# Patient Record
Sex: Female | Born: 1955 | Race: White | Hispanic: No | Marital: Married | Smoking: Former smoker
Health system: Southern US, Community
[De-identification: ages and names within clinical notes are randomized; demographics above are authoritative.]

## PROBLEM LIST (undated history)

## (undated) DIAGNOSIS — I499 Cardiac arrhythmia, unspecified: Secondary | ICD-10-CM

## (undated) DIAGNOSIS — I4891 Unspecified atrial fibrillation: Secondary | ICD-10-CM

## (undated) DIAGNOSIS — I10 Essential (primary) hypertension: Secondary | ICD-10-CM

## (undated) HISTORY — PX: CHOLECYSTECTOMY: SHX55

## (undated) HISTORY — DX: Unspecified atrial fibrillation: I48.91

## (undated) HISTORY — PX: WRIST SURGERY: SHX841

---
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
## 1998-03-06 HISTORY — PX: ABDOMINAL HYSTERECTOMY: SHX81

## 1998-03-06 HISTORY — PX: TOTAL ABDOMINAL HYSTERECTOMY: SHX209

## 2003-03-07 HISTORY — PX: CHOLECYSTECTOMY: SHX55

## 2007-03-07 HISTORY — PX: ROUX-EN-Y GASTRIC BYPASS: SHX1104

## 2015-03-07 HISTORY — PX: WRIST SURGERY: SHX841

## 2015-09-19 ENCOUNTER — Emergency Department
Admission: EM | Admit: 2015-09-19 | Discharge: 2015-09-19 | Disposition: A | Discharge status: Home / Self Care | Payer: Self-pay | Source: Home / Self Care | Attending: Family Medicine | Admitting: Family Medicine

## 2015-09-19 ENCOUNTER — Encounter: Payer: Self-pay | Admitting: Emergency Medicine

## 2015-09-19 DIAGNOSIS — N3001 Acute cystitis with hematuria: Secondary | ICD-10-CM

## 2015-09-19 HISTORY — DX: Essential (primary) hypertension: I10

## 2015-09-19 LAB — POCT URINALYSIS DIP (MANUAL ENTRY)
Bilirubin, UA: NEGATIVE
Glucose, UA: NEGATIVE
Ketones, POC UA: NEGATIVE
Nitrite, UA: NEGATIVE
Protein Ur, POC: 100 — AB
Spec Grav, UA: 1.015 (ref 1.005–1.03)
Urobilinogen, UA: 0.2 (ref 0–1)
pH, UA: 6 (ref 5–8)

## 2015-09-19 MED ORDER — AMOXICILLIN-POT CLAVULANATE 500-125 MG PO TABS: 1.0000 | ORAL_TABLET | Freq: Two times a day (BID) | ORAL | Status: DC

## 2015-09-19 NOTE — ED Provider Notes (Signed)
CSN: [id number]     Arrival date & time 09/19/15  1725 History   First MD Initiated Contact with Patient 09/19/15 1730     Chief Complaint  Patient presents with  . Dysuria   (Consider location/radiation/quality/duration/timing/severity/associated sxs/prior Treatment) HPI  Judith Wood is a 60 y.o. female presenting to UC with c/o 2 days of urinary symptoms including burning with urination and blood in urine that started today. Hx of UTI, last one about 1 year ago, she was on amoxicillin and did well. Burning is mild to moderate in severity. Denies fever, chills, n/v/d. Denies abdominal pain or back pain. She has not taken any OTC medications for symptoms.    Past Medical History  Diagnosis Date  . Hypertension    Past Surgical History  Procedure Laterality Date  . Abdominal hysterectomy    . Cholecystectomy    . Wrist surgery     History reviewed. No pertinent family history. Social History  Substance Use Topics  . Smoking status: Never Smoker   . Smokeless tobacco: None  . Alcohol Use: No   OB History    No data available     Review of Systems  Constitutional: Negative for fever and chills.  Gastrointestinal: Negative for nausea, vomiting, abdominal pain and diarrhea.  Genitourinary: Positive for dysuria and hematuria. Negative for urgency, frequency, flank pain and pelvic pain.  Musculoskeletal: Negative for myalgias and back pain.    Allergies  Norco and Tetracyclines & related  Home Medications   Prior to Admission medications   Medication Sig Start Date End Date Taking? Authorizing Provider  aspirin 81 MG chewable tablet Chew by mouth daily.   Yes Historical Provider, MD  diltiazem (DILACOR XR) 180 MG 24 hr capsule Take 180 mg by mouth daily.   Yes Historical Provider, MD  amoxicillin-clavulanate (AUGMENTIN) 500-125 MG tablet Take 1 tablet (500 mg total) by mouth 2 (two) times daily. For 7 days 09/19/15   Noland Fordyce, PA-C   Meds Ordered and Administered  this Visit  Medications - No data to display  BP 133/83 mmHg  Pulse 77  Temp(Src) 98 F (36.7 C) (Oral)  Resp 16  Ht 5\' 5"  (1.651 m)  Wt 244 lb 12 oz (111.018 kg)  BMI 40.73 kg/m2  SpO2 98% No data found.   Physical Exam  Constitutional: She is oriented to person, place, and time. She appears well-developed and well-nourished.  HENT:  Head: Normocephalic and atraumatic.  Mouth/Throat: Oropharynx is clear and moist.  Eyes: EOM are normal.  Neck: Normal range of motion.  Cardiovascular: Normal rate, regular rhythm and normal heart sounds.   Pulmonary/Chest: Effort normal and breath sounds normal. No respiratory distress. She has no wheezes. She has no rales.  Abdominal: Soft. She exhibits no distension. There is no tenderness. There is no CVA tenderness.  Musculoskeletal: Normal range of motion.  Neurological: She is alert and oriented to person, place, and time.  Skin: Skin is warm and dry.  Psychiatric: She has a normal mood and affect. Her behavior is normal.  Nursing note and vitals reviewed.   ED Course  Procedures (including critical care time)  Labs Review Labs Reviewed  POCT URINALYSIS DIP (MANUAL ENTRY) - Abnormal; Notable for the following:    Color, UA red (*)    Clarity, UA cloudy (*)    Blood, UA large (*)    Protein Ur, POC =100 (*)    Leukocytes, UA moderate (2+) (*)    All other components within normal  limits  URINE CULTURE    Imaging Review No results found.    MDM   1. Acute cystitis with hematuria    Urinary symptoms for 2 days. Hx of UTI. Vitals: WNL  UA c/w UTI, will send culture.  Rx: augmentin (pt has done well with amoxicillin in the past).  Pt may try OTC Azo for discomfort. Encouraged to stay well hydrated. F/u with PCP in 4-5 days if not improving, sooner if worsening. Patient verbalized understanding and agreement with treatment plan.     Noland Fordyce, PA-C 09/19/15 1759

## 2015-09-19 NOTE — ED Notes (Signed)
Patient presents to [facility] with C/O dysuria and blood in the urine since yesterday

## 2015-09-19 NOTE — Discharge Instructions (Signed)
°  You may try over the counter medication called Azo to help with bladder spasms.  This medication can make your urine orange, which is normal.  ° ° °

## 2015-09-21 LAB — URINE CULTURE: Colony Count: 75000

## 2015-09-22 ENCOUNTER — Telehealth: Payer: Self-pay | Admitting: Emergency Medicine

## 2015-09-22 NOTE — Telephone Encounter (Signed)
E coli present, finish all meds, feeling better

## 2015-10-14 ENCOUNTER — Ambulatory Visit (INDEPENDENT_AMBULATORY_CARE_PROVIDER_SITE_OTHER): Payer: Self-pay

## 2015-10-14 ENCOUNTER — Ambulatory Visit
Admission: EM | Admit: 2015-10-14 | Discharge: 2015-10-14 | Disposition: A | Discharge status: Home / Self Care | Payer: Self-pay | Attending: Family Medicine | Admitting: Family Medicine

## 2015-10-14 ENCOUNTER — Encounter: Payer: Self-pay | Admitting: *Deleted

## 2015-10-14 DIAGNOSIS — M62830 Muscle spasm of back: Secondary | ICD-10-CM

## 2015-10-14 DIAGNOSIS — M545 Low back pain, unspecified: Secondary | ICD-10-CM

## 2015-10-14 MED ORDER — MELOXICAM 15 MG PO TABS: 15.0000 mg | ORAL_TABLET | Freq: Every day | ORAL | 0 refills | Status: DC

## 2015-10-14 MED ORDER — OXYCODONE-ACETAMINOPHEN 5-325 MG PO TABS: ORAL_TABLET | ORAL | 0 refills | Status: DC

## 2015-10-14 MED ORDER — ORPHENADRINE CITRATE ER 100 MG PO TB12: 100.0000 mg | ORAL_TABLET | Freq: Two times a day (BID) | ORAL | 0 refills | Status: DC

## 2015-10-14 MED ORDER — KETOROLAC TROMETHAMINE 60 MG/2ML IM SOLN
60.0000 mg | Freq: Once | INTRAMUSCULAR | Status: AC
  Administered 2015-10-14: 60 mg via INTRAMUSCULAR

## 2015-10-14 NOTE — ED Triage Notes (Signed)
Patient started having thoracic back pain 2 months ago that has steadily become worse.

## 2015-10-14 NOTE — ED Provider Notes (Signed)
[facility]    CSN: [id number] Arrival date & time: 10/14/15  1637  First Provider Contact:  First MD Initiated Contact with Patient 10/14/15 1743        History   Chief Complaint Chief Complaint  Patient presents with  . Back Pain    HPI Judith Wood is a 60 y.o. female.   Patient is sent from Dr. Oswaldo Milian due to recurrent back pain. Patient she states she's had back pain on the right since June. She reports moving from [place] having a less than ideal bed to sleep and then doing Activity at the placed they're moving into i.e. working on wall covering painting etc. she finally saw Dr. Oswaldo Milian can see him twice in the last week. Because of mild pain and discomfort that she's had similar here to be seen and evaluated. She is wearing a back brace but has not helped much. She did have some Percocet from a old wrist injury and she did take that last night was finally able to sleep. Past medical history she has a history of hypertension and A. fib. She states that she's on the exam for the A. fib and hypertension if she drinks alcohol A. fib gets worse occasionally she has a breakthrough she'll take a flight cannot tell that she has. She takes baby aspirin has not established herself with a PCP since moving here from [place]. Previous surgeries abdominal hysterectomy wrist surgery and cholecystectomy. She is allergic to hydrocodone but can take Percocet. She has never smoked and has no pertinent family medical history relating to this visit.   No language interpreter was used.  Back Pain  Location:  Lumbar spine Quality:  Aching, shooting and stabbing Radiates to:  Does not radiate Pain severity:  Severe Pain is:  Same all the time Timing:  Constant Progression:  Worsening Chronicity:  New Context: physical stress and twisting   Context: not falling, not jumping from heights, not MCA, not MVA, not occupational injury and not recent injury   Relieved by:  Narcotics and  cold packs Worsened by:  Ambulation, movement and twisting Ineffective treatments:  None tried Associated symptoms: no abdominal pain, no leg pain and no numbness   Risk factors: no recent surgery     Past Medical History:  Diagnosis Date  . Hypertension     There are no active problems to display for this patient.   Past Surgical History:  Procedure Laterality Date  . ABDOMINAL HYSTERECTOMY    . CHOLECYSTECTOMY    . WRIST SURGERY      OB History    No data available       Home Medications    Prior to Admission medications   Medication Sig Start Date End Date Taking? Authorizing Provider  aspirin 81 MG chewable tablet Chew by mouth daily.   Yes Historical Provider, MD  diltiazem (DILACOR XR) 180 MG 24 hr capsule Take 180 mg by mouth daily.   Yes Historical Provider, MD  amoxicillin-clavulanate (AUGMENTIN) 500-125 MG tablet Take 1 tablet (500 mg total) by mouth 2 (two) times daily. For 7 days 09/19/15   Noland Fordyce, PA-C  meloxicam (MOBIC) 15 MG tablet Take 1 tablet (15 mg total) by mouth daily. 10/14/15   Frederich Cha, MD  orphenadrine (NORFLEX) 100 MG tablet Take 1 tablet (100 mg total) by mouth 2 (two) times daily. 10/14/15   Frederich Cha, MD  oxyCODONE-acetaminophen (PERCOCET/ROXICET) 5-325 MG tablet 1/2-1 tablet at night when necessary for back pain 10/14/15  Frederich Cha, MD    Family History History reviewed. No pertinent family history.  Social History Social History  Substance Use Topics  . Smoking status: Never Smoker  . Smokeless tobacco: Never Used  . Alcohol use No     Allergies   Norco [hydrocodone-acetaminophen] and Tetracyclines & related   Review of Systems Review of Systems  Gastrointestinal: Negative for abdominal pain.  Musculoskeletal: Positive for back pain.  Neurological: Negative for numbness.  All other systems reviewed and are negative.    Physical Exam Triage Vital Signs ED Triage Vitals  Enc Vitals Group     BP 10/14/15 1734  (!) 173/105     Pulse Rate 10/14/15 1734 74     Resp 10/14/15 1734 18     Temp 10/14/15 1734 98.4 F (36.9 C)     Temp Source 10/14/15 1734 Oral     SpO2 10/14/15 1734 99 %     Weight 10/14/15 1734 265 lb (120.2 kg)     Height 10/14/15 1734 5\' 5"  (1.651 m)     Head Circumference --      Peak Flow --      Pain Score 10/14/15 1737 8     Pain Loc --      Pain Edu? --      Excl. in [place]? --    No data found.   Updated Vital Signs BP (!) 173/105 (BP Location: Left Wrist)   Pulse 74   Temp 98.4 F (36.9 C) (Oral)   Resp 18   Ht 5\' 5"  (1.651 m)   Wt 265 lb (120.2 kg)   SpO2 99%   BMI 44.10 kg/m   Visual Acuity Right Eye Distance:   Left Eye Distance:   Bilateral Distance:    Right Eye Near:   Left Eye Near:    Bilateral Near:     Physical Exam  Constitutional: She is oriented to person, place, and time. She appears well-developed and well-nourished. She appears distressed.  HENT:  Head: Normocephalic and atraumatic.  Eyes: Pupils are equal, round, and reactive to light.  Neck: Normal range of motion.  Pulmonary/Chest: Effort normal.  Musculoskeletal: She exhibits tenderness. She exhibits no edema or deformity.  Neurological: She is alert and oriented to person, place, and time.  Skin: Skin is warm. She is not diaphoretic.  Psychiatric: She has a normal mood and affect.  Vitals reviewed.    UC Treatments / Results  Labs (all labs ordered are listed, but only abnormal results are displayed) Labs Reviewed - No data to display  EKG  EKG Interpretation None       Radiology Dg Lumbar Spine Complete  Result Date: 10/14/2015 CLINICAL DATA:  Chronic lower back pain. EXAM: LUMBAR SPINE - COMPLETE 4+ VIEW COMPARISON:  None. FINDINGS: Mild dextroscoliosis of lumbar spine is noted. No fracture or spondylolisthesis is noted. Severe degenerative disc disease is noted at L3-4, L4-5 and L5-S1. Anterior osteophyte formation is noted anteriorly at L2-3. IMPRESSION: Severe  multilevel degenerative disc disease. No acute abnormality seen in the lumbar spine. Electronically Signed   By: Marijo Conception, M.D.   On: 10/14/2015 18:49    Procedures Procedures (including critical care time)  Medications Ordered in UC Medications  ketorolac (TORADOL) injection 60 mg (60 mg Intramuscular Given 10/14/15 1821)     Initial Impression / Assessment and Plan / UC Course  I have reviewed the triage vital signs and the nursing notes.  Pertinent labs & imaging results that were  available during my care of the patient were reviewed by me and considered in my medical decision making (see chart for details).  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 Will recommend she goes back to Dr. Oswaldo Milian for dry needling and manipulation of the back and spine. Ice for pain control. Percocet 1/2-1 tablet night as needed for pain control. 60 Toradol given now today and will place on Mobic 15 mg and Norflex 100 mg twice a day for muscle spasm and back pain control.  Final Clinical Impressions(s) / UC Diagnoses   Final diagnoses:  Muscle spasm of back  Right-sided low back pain without sciatica    New Prescriptions Discharge Medication List as of 10/14/2015  7:08 PM    START taking these medications   Details  meloxicam (MOBIC) 15 MG tablet Take 1 tablet (15 mg total) by mouth daily., Starting Thu 10/14/2015, Normal    orphenadrine (NORFLEX) 100 MG tablet Take 1 tablet (100 mg total) by mouth 2 (two) times daily., Starting Thu 10/14/2015, Normal    oxyCODONE-acetaminophen (PERCOCET/ROXICET) 5-325 MG tablet 1/2-1 tablet at night when necessary for back pain, Normal         Note: This dictation was prepared with Dragon dictation along with smaller phrase technology. Any transcriptional errors that result from this process are unintentional.   Frederich Cha, MD 10/14/15 [phone]

## 2016-01-15 ENCOUNTER — Other Ambulatory Visit: Payer: Self-pay | Admitting: Internal Medicine

## 2016-01-18 ENCOUNTER — Encounter: Payer: Self-pay | Admitting: Internal Medicine

## 2016-01-18 ENCOUNTER — Ambulatory Visit (INDEPENDENT_AMBULATORY_CARE_PROVIDER_SITE_OTHER): Payer: Self-pay | Admitting: Internal Medicine

## 2016-01-18 VITALS — BP 126/82 | HR 67 | Resp 16 | Ht 65.0 in | Wt 264.0 lb

## 2016-01-18 DIAGNOSIS — E559 Vitamin D deficiency, unspecified: Secondary | ICD-10-CM | POA: Insufficient documentation

## 2016-01-18 DIAGNOSIS — E785 Hyperlipidemia, unspecified: Secondary | ICD-10-CM | POA: Insufficient documentation

## 2016-01-18 DIAGNOSIS — I480 Paroxysmal atrial fibrillation: Secondary | ICD-10-CM | POA: Insufficient documentation

## 2016-01-18 DIAGNOSIS — E782 Mixed hyperlipidemia: Secondary | ICD-10-CM

## 2016-01-18 DIAGNOSIS — Z8679 Personal history of other diseases of the circulatory system: Secondary | ICD-10-CM

## 2016-01-18 DIAGNOSIS — R928 Other abnormal and inconclusive findings on diagnostic imaging of breast: Secondary | ICD-10-CM

## 2016-01-18 DIAGNOSIS — Z9884 Bariatric surgery status: Secondary | ICD-10-CM | POA: Insufficient documentation

## 2016-01-18 DIAGNOSIS — I10 Essential (primary) hypertension: Secondary | ICD-10-CM

## 2016-01-18 HISTORY — DX: Other abnormal and inconclusive findings on diagnostic imaging of breast: R92.8

## 2016-01-18 MED ORDER — DILTIAZEM HCL ER 180 MG PO CP24: 180.0000 mg | ORAL_CAPSULE | Freq: Every day | ORAL | 1 refills | Status: DC

## 2016-01-18 NOTE — Progress Notes (Signed)
Date:  01/18/2016   Name:  Judith Wood   DOB:  01-22-1956   MRN:  [id number]   Chief Complaint: Establish Care (Discuss mammograms ) Abnormal mammogram - gets annual mammograms.  In 2016 November had an abnormality in right breast.  Had moved to [facility] so had another mammogram that was normal.  Now due for 6 month follow up that was recommended.  Hypertension  This is a chronic problem. The current episode started more than 1 year ago. The problem is unchanged. The problem is controlled. Pertinent negatives include no chest pain, headaches, palpitations or shortness of breath. (Had onset of A Fib 2 years ago - related to wine and now has not had any further sx.)    Review of Systems  Constitutional: Negative for chills, fatigue, fever and unexpected weight change.  Eyes: Negative for visual disturbance.  Respiratory: Negative for cough, chest tightness, shortness of breath and wheezing.   Cardiovascular: Negative for chest pain, palpitations and leg swelling.  Gastrointestinal: Negative for abdominal pain.  Neurological: Negative for dizziness and headaches.    Patient Active Problem List   Diagnosis Date Noted  . Hypertension 01/18/2016  . Episodic atrial fibrillation ([place]) 01/18/2016    Prior to Admission medications   Medication Sig Start Date End Date Taking? Authorizing Provider  aspirin 81 MG chewable tablet Chew by mouth daily.   Yes Historical Provider, MD  diltiazem (DILACOR XR) 180 MG 24 hr capsule Take 180 mg by mouth daily.   Yes Historical Provider, MD    Allergies  Allergen Reactions  . Norco [Hydrocodone-Acetaminophen]   . Tetracyclines & Related     Past Surgical History:  Procedure Laterality Date  . ABDOMINAL HYSTERECTOMY     has cervix   . CHOLECYSTECTOMY    . WRIST SURGERY      Social History  Substance Use Topics  . Smoking status: Never Smoker  . Smokeless tobacco: Never Used  . Alcohol use No    Medication list has been reviewed and  updated.   Physical Exam  Constitutional: She is oriented to person, place, and time. She appears well-developed and well-nourished. No distress.  HENT:  Head: Normocephalic and atraumatic.  Neck: Normal range of motion. Neck supple. No thyromegaly present.  Cardiovascular: Normal rate, regular rhythm and normal heart sounds.  Exam reveals no gallop.   No murmur heard. Pulmonary/Chest: Effort normal and breath sounds normal. No respiratory distress. She has no wheezes.  Musculoskeletal: Normal range of motion.  Neurological: She is alert and oriented to person, place, and time.  Skin: Skin is warm and dry. No rash noted.  Psychiatric: She has a normal mood and affect. Her behavior is normal. Thought content normal.  Nursing note and vitals reviewed.   BP 126/82   Pulse 67   Resp 16   Ht 5\' 5"  (1.651 m)   Wt 264 lb (119.7 kg)   SpO2 98%   BMI 43.93 kg/m   Assessment and Plan: 1. Essential hypertension controlled - diltiazem (DILACOR XR) 180 MG 24 hr capsule; Take 1 capsule (180 mg total) by mouth daily.  Dispense: 90 capsule; Refill: 1 - Comprehensive metabolic panel - TSH  2. History of atrial fibrillation None since initial episode  3. Abnormal mammogram of right breast - US BREAST LTD UNI RIGHT INC AXILLA; Future - MM Digital Diagnostic Bilat; Future  4. Mixed hyperlipidemia Check labs since she has gained back most of her weight - Lipid panel  5. History  of Roux-en-Y gastric bypass Continue supplements - Vitamin B12 - CBC with Differential/Platelet  6. Vitamin D deficiency Continue supplement - VITAMIN D 25 Hydroxy (Vit-D Deficiency, Fractures)   Halina Maidens, MD [facility]  01/18/2016

## 2016-01-24 ENCOUNTER — Other Ambulatory Visit: Payer: Self-pay | Admitting: *Deleted

## 2016-01-24 ENCOUNTER — Inpatient Hospital Stay
Admission: RE | Admit: 2016-01-24 | Discharge: 2016-01-24 | Disposition: A | Discharge status: Home / Self Care | Payer: Self-pay | Source: Ambulatory Visit | Attending: *Deleted | Admitting: *Deleted

## 2016-01-24 DIAGNOSIS — Z9289 Personal history of other medical treatment: Secondary | ICD-10-CM

## 2016-02-08 ENCOUNTER — Other Ambulatory Visit: Payer: Self-pay | Admitting: Internal Medicine

## 2016-02-08 ENCOUNTER — Ambulatory Visit
Admission: RE | Admit: 2016-02-08 | Discharge: 2016-02-08 | Disposition: A | Discharge status: Home / Self Care | Payer: Self-pay | Source: Ambulatory Visit | Attending: Internal Medicine | Admitting: Internal Medicine

## 2016-02-08 DIAGNOSIS — R928 Other abnormal and inconclusive findings on diagnostic imaging of breast: Secondary | ICD-10-CM

## 2016-02-08 LAB — LIPID PANEL
CHOLESTEROL: 175 (ref 0–200)
HDL: 38 (ref 35–70)
LDL Cholesterol: 100
Triglycerides: 244 — AB (ref 40–160)

## 2016-02-08 LAB — BASIC METABOLIC PANEL
BUN: 23 — AB (ref 4–21)
Creatinine: 0.9 (ref 0.5–1.1)
GLUCOSE: 89

## 2016-02-08 LAB — CBC AND DIFFERENTIAL
HEMATOCRIT: 38 (ref 36–46)
HEMOGLOBIN: 12.6 (ref 12.0–16.0)

## 2016-02-08 LAB — VITAMIN B12: VITAMIN B 12: 517

## 2016-02-08 LAB — VITAMIN D 25 HYDROXY (VIT D DEFICIENCY, FRACTURES): Vit D, 25-Hydroxy: 29

## 2016-02-08 LAB — TSH: TSH: 2.29 (ref 0.41–5.90)

## 2016-02-08 LAB — HEPATIC FUNCTION PANEL
ALT: 30 (ref 7–35)
AST: 29 (ref 13–35)

## 2016-02-15 NOTE — Progress Notes (Signed)
Please let patient know that her labs are normal.  Continue same medications.

## 2016-03-08 ENCOUNTER — Telehealth: Payer: Self-pay | Admitting: Internal Medicine

## 2016-03-08 NOTE — Telephone Encounter (Signed)
Called pt about lab result--normal except Vitamin D slightly low and need to double on vitamin D supplement.

## 2016-03-08 NOTE — Telephone Encounter (Signed)
Please call the patient to inform her that all of her labs are normal except for slightly low vitamin D. She should double the amount of her vitamin D supplement.

## 2016-03-28 ENCOUNTER — Encounter: Payer: Self-pay | Admitting: Internal Medicine

## 2016-03-28 ENCOUNTER — Ambulatory Visit (INDEPENDENT_AMBULATORY_CARE_PROVIDER_SITE_OTHER): Payer: Self-pay | Admitting: Internal Medicine

## 2016-03-28 VITALS — BP 126/82 | HR 82 | Temp 97.6°F | Ht 65.0 in | Wt 258.0 lb

## 2016-03-28 DIAGNOSIS — N3001 Acute cystitis with hematuria: Secondary | ICD-10-CM

## 2016-03-28 LAB — POC URINALYSIS WITH MICROSCOPIC (NON AUTO)MANUAL RESULT
CRYSTALS: 0
EPITHELIAL CELLS, URINE PER MICROSCOPY: 0
MUCUS UA: 0
Nitrite, UA: NEGATIVE
PROTEIN UA: 5
RBC: 2 M/uL — AB (ref 4.04–5.48)
Spec Grav, UA: <=1.005
Urobilinogen, UA: 0.2
WBC Casts, UA: 15
pH, UA: 5

## 2016-03-28 MED ORDER — CIPROFLOXACIN HCL 250 MG PO TABS
250.0000 mg | ORAL_TABLET | Freq: Two times a day (BID) | ORAL | 0 refills | Status: DC
Start: 2016-03-28 — End: 2016-08-30

## 2016-03-28 NOTE — Patient Instructions (Signed)

## 2016-03-28 NOTE — Progress Notes (Signed)
Date:  03/28/2016   Name:  Judith Wood   DOB:  09-11-1955   MRN:  [id number]   Chief Complaint: Urinary Tract Infection Urinary Tract Infection   This is a new problem. The current episode started yesterday. The problem occurs every urination. The quality of the pain is described as burning. The pain is mild. There has been no fever. Associated symptoms include frequency, hesitancy and urgency. Pertinent negatives include no chills, discharge, flank pain or hematuria. She has tried increased fluids for the symptoms.  Last UTI was E coli pan sensitive to all but amox in July 2017.   Review of Systems  Constitutional: Negative for chills, fatigue and fever.  Respiratory: Negative for cough, chest tightness and wheezing.   Cardiovascular: Negative for chest pain and palpitations.  Gastrointestinal: Negative for abdominal pain and blood in stool.  Genitourinary: Positive for frequency, hesitancy and urgency. Negative for difficulty urinating, flank pain and hematuria.  Musculoskeletal: Negative for arthralgias.  Neurological: Negative for dizziness and headaches.    Patient Active Problem List   Diagnosis Date Noted  . Hypertension 01/18/2016  . History of atrial fibrillation 01/18/2016  . Hyperlipidemia 01/18/2016  . 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 Vitamin D deficiency 01/18/2016  . Abnormal mammogram of right breast 01/18/2016    Prior to Admission medications   Medication Sig Start Date End Date Taking? Authorizing Provider  aspirin 81 MG chewable tablet Chew by mouth daily.    Historical Provider, MD  b complex vitamins tablet Take 1 tablet by mouth daily.    Historical Provider, MD  Calcium Citrate-Vitamin D (CALCIUM CITRATE + D PO) Take 4 tablets by mouth.    Historical Provider, MD  cholecalciferol (VITAMIN D) 400 units TABS tablet Take 400 Units by mouth.    Historical Provider, MD  Chromium-Cinnamon (CINNAMON PLUS CHROMIUM PO) Take by mouth.     Historical Provider, MD  diltiazem (DILACOR XR) 180 MG 24 hr capsule Take 1 capsule (180 mg total) by mouth daily. 01/18/16   Glean Hess, MD  ferrous sulfate 325 (65 FE) MG EC tablet Take 325 mg by mouth 3 (three) times daily with meals.    Historical Provider, MD  glucosamine-chondroitin 500-400 MG tablet Take 1 tablet by mouth 3 (three) times daily.    Historical Provider, MD  magnesium 30 MG tablet Take 30 mg by mouth 2 (two) times daily.    Historical Provider, MD    Allergies  Allergen Reactions  . Norco [Hydrocodone-Acetaminophen]   . Tetracyclines & Related     Past Surgical History:  Procedure Laterality Date  . ABDOMINAL HYSTERECTOMY  2000   ovarian cyst; cervix remains  . CHOLECYSTECTOMY    . ROUX-EN-Y GASTRIC BYPASS  2009  . WRIST SURGERY      Social History  Substance Use Topics  . Smoking status: Never Smoker  . Smokeless tobacco: Never Used  . Alcohol use No     Medication list has been reviewed and updated.   Physical Exam  Constitutional: She appears well-developed and well-nourished.  Cardiovascular: Normal rate, regular rhythm and normal heart sounds.   Pulmonary/Chest: Effort normal and breath sounds normal. No respiratory distress.  Abdominal: Soft. Bowel sounds are normal. There is tenderness in the suprapubic area. There is no rebound, no guarding and no CVA tenderness.  Psychiatric: She has a normal mood and affect.  Nursing note and vitals reviewed.   BP 126/82   Pulse 82   Temp 97.6  F (36.4 C)   Ht 5\' 5"  (1.651 m)   Wt 258 lb (117 kg)   SpO2 98%   BMI 42.93 kg/m   Assessment and Plan: 1. Acute cystitis with hematuria Cipro 250 mg bid x 5 day Continue fluid intake - POC urinalysis w microscopic (non auto)   Halina Maidens, MD [facility]  03/28/2016

## 2016-08-30 ENCOUNTER — Ambulatory Visit: Payer: Self-pay | Admitting: Internal Medicine

## 2016-08-30 ENCOUNTER — Other Ambulatory Visit: Payer: Self-pay | Admitting: Internal Medicine

## 2016-08-31 ENCOUNTER — Ambulatory Visit (INDEPENDENT_AMBULATORY_CARE_PROVIDER_SITE_OTHER): Payer: Self-pay | Admitting: Internal Medicine

## 2016-08-31 ENCOUNTER — Encounter: Payer: Self-pay | Admitting: Internal Medicine

## 2016-08-31 VITALS — BP 118/74 | HR 74 | Ht 65.0 in | Wt 257.2 lb

## 2016-08-31 DIAGNOSIS — Z8679 Personal history of other diseases of the circulatory system: Secondary | ICD-10-CM

## 2016-08-31 DIAGNOSIS — L820 Inflamed seborrheic keratosis: Secondary | ICD-10-CM

## 2016-08-31 DIAGNOSIS — E559 Vitamin D deficiency, unspecified: Secondary | ICD-10-CM

## 2016-08-31 DIAGNOSIS — I10 Essential (primary) hypertension: Secondary | ICD-10-CM

## 2016-08-31 MED ORDER — DILTIAZEM HCL ER 180 MG PO CP24: 180.0000 mg | ORAL_CAPSULE | Freq: Every day | ORAL | 3 refills | Status: DC

## 2016-08-31 NOTE — Progress Notes (Signed)
Date:  08/31/2016   Name:  Judith Wood   DOB:  May 08, 1955   MRN:  [id number]   Chief Complaint: Hyperlipidemia; Hypertension (Needs refill on meds); and Nevus (Been there for while but now its "crusty and itching" and wants it to be looked) Hyperlipidemia  This is a chronic problem. Lipid results: high triglycerides. She has no history of hypothyroidism. Pertinent negatives include no chest pain or shortness of breath. Current antihyperlipidemic treatment includes diet change.  Hypertension  This is a chronic problem. The problem is controlled. Associated symptoms include palpitations. Pertinent negatives include no chest pain, headaches or shortness of breath. Past treatments include calcium channel blockers. The current treatment provides significant (last episode of Afib was 6 months ago, lasted 6 ours and resolved after additional cardiazem dose) improvement.  Vitamin D def - lat level was borderline low so she doubled her daily supplement to previous level. Skin lesion - has many moles but there is a crusly lesion on upper back that is getting very itchy.  Lab Results  Component Value Date   CHOL 175 02/08/2016   HDL 38 02/08/2016   LDLCALC 100 02/08/2016   TRIG 244 (A) 02/08/2016   Lab Results  Component Value Date   CREATININE 0.9 02/08/2016   Lab Results  Component Value Date   HGB 12.6 02/08/2016   HCT 38 02/08/2016     Review of Systems  Constitutional: Negative for appetite change, fatigue, fever and unexpected weight change.  HENT: Negative for tinnitus and trouble swallowing.   Eyes: Negative for visual disturbance.  Respiratory: Negative for cough, chest tightness and shortness of breath.   Cardiovascular: Positive for palpitations. Negative for chest pain and leg swelling.  Gastrointestinal: Negative for abdominal pain.  Endocrine: Negative for polyuria.  Genitourinary: Negative for dysuria and hematuria.  Musculoskeletal: Negative for arthralgias.    Neurological: Negative for tremors, numbness and headaches.  Hematological: Negative for adenopathy.  Psychiatric/Behavioral: Negative for dysphoric mood.    Patient Active Problem List   Diagnosis Date Noted  . Hypertension 01/18/2016  . History of atrial fibrillation 01/18/2016  . Hyperlipidemia 01/18/2016  . 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 Vitamin D deficiency 01/18/2016  . Abnormal mammogram of right breast 01/18/2016    Prior to Admission medications   Medication Sig Start Date End Date Taking? Authorizing Provider  aspirin 81 MG chewable tablet Chew by mouth daily.   Yes [provider]  b complex vitamins tablet Take 1 tablet by mouth daily.   Yes [provider]  Calcium Citrate-Vitamin D (CALCIUM CITRATE + D PO) Take 4 tablets by mouth.   Yes [provider]  cholecalciferol (VITAMIN D) 400 units TABS tablet Take 400 Units by mouth.   Yes [provider]  diltiazem (DILACOR XR) 180 MG 24 hr capsule Take 1 capsule (180 mg total) by mouth daily. 01/18/16  Yes Glean Hess, MD  ferrous sulfate 325 (65 FE) MG EC tablet Take 325 mg by mouth 3 (three) times daily with meals.   Yes [provider]  glucosamine-chondroitin 500-400 MG tablet Take 1 tablet by mouth 3 (three) times daily.   Yes [provider]  magnesium 30 MG tablet Take 30 mg by mouth 2 (two) times daily.   Yes [provider]    Allergies  Allergen Reactions  . Norco [Hydrocodone-Acetaminophen]   . Tetracyclines & Related     Past Surgical History:  Procedure Laterality Date  . ABDOMINAL HYSTERECTOMY  2000   ovarian cyst; cervix remains  . CHOLECYSTECTOMY    . ROUX-EN-Y GASTRIC BYPASS  2009  . WRIST SURGERY      Social History  Substance Use Topics  . Smoking status: Never Smoker  . Smokeless tobacco: Never Used  . Alcohol use No     Medication list has been reviewed and updated.   Physical Exam   Constitutional: She is oriented to person, place, and time. She appears well-developed. No distress.  HENT:  Head: Normocephalic and atraumatic.  Neck: Normal range of motion. Neck supple. Carotid bruit is not present.  Cardiovascular: Normal rate, regular rhythm and normal heart sounds.   No murmur heard. Pulmonary/Chest: Effort normal. No respiratory distress. She has no wheezes.  Musculoskeletal: She exhibits no edema.  Neurological: She is alert and oriented to person, place, and time.  Skin: Skin is warm and dry. No rash noted.  Psychiatric: She has a normal mood and affect. Her behavior is normal. Thought content normal.  Nursing note and vitals reviewed.   BP 118/74   Pulse 74   Ht 5\' 5"  (1.651 m)   Wt 257 lb 3.2 oz (116.7 kg)   SpO2 97%   BMI 42.80 kg/m   Assessment and Plan: 1. Essential hypertension Controlled with no recent Afib Continue current therapy - diltiazem (DILACOR XR) 180 MG 24 hr capsule; Take 1 capsule (180 mg total) by mouth daily.  Dispense: 90 capsule; Refill: 3  2. History of atrial fibrillation Rare episodes  3. Vitamin D deficiency Continue supplementation  4. Seborrheic keratoses, inflamed Recommend Dermatology evaluation and skin survey   Meds ordered this encounter  Medications  . diltiazem (DILACOR XR) 180 MG 24 hr capsule    Sig: Take 1 capsule (180 mg total) by mouth daily.    Dispense:  90 capsule    Refill:  [place], MD [facility]  08/31/2016

## 2016-08-31 NOTE — Patient Instructions (Addendum)
See if you can add Hepatitis C antibody test to next labs  Call [facility] for free Mammogram and Pap smear   Breast Self-Awareness Breast self-awareness means being familiar with how your breasts look and feel. It involves checking your breasts regularly and reporting any changes to your health care provider. Practicing breast self-awareness is important. A change in your breasts can be a sign of a serious medical problem. Being familiar with how your breasts look and feel allows you to find any problems early, when treatment is more likely to be successful. All women should practice breast self-awareness, including women who have had breast implants. How to do a breast self-exam One way to learn what is normal for your breasts and whether your breasts are changing is to do a breast self-exam. To do a breast self-exam: Look for Changes  1. Remove all the clothing above your waist. 2. Stand in front of a mirror in a room with good lighting. 3. Put your hands on your hips. 4. Push your hands firmly downward. 5. Compare your breasts in the mirror. Look for differences between them (asymmetry), such as: ? Differences in shape. ? Differences in size. ? Puckers, dips, and bumps in one breast and not the other. 6. Look at each breast for changes in your skin, such as: ? Redness. ? Scaly areas. 7. Look for changes in your nipples, such as: ? Discharge. ? Bleeding. ? Dimpling. ? Redness. ? A change in position. Feel for Changes  Carefully feel your breasts for lumps and changes. It is best to do this while lying on your back on the floor and again while sitting or standing in the shower or tub with soapy water on your skin. Feel each breast in the following way:  Place the arm on the side of the breast you are examining above your head.  Feel your breast with the other hand.  Start in the nipple area and make  inch (2 cm) overlapping circles to feel your breast. Use the pads  of your three middle fingers to do this. Apply light pressure, then medium pressure, then firm pressure. The light pressure will allow you to feel the tissue closest to the skin. The medium pressure will allow you to feel the tissue that is a little deeper. The firm pressure will allow you to feel the tissue close to the ribs.  Continue the overlapping circles, moving downward over the breast until you feel your ribs below your breast.  Move one finger-width toward the center of the body. Continue to use the  inch (2 cm) overlapping circles to feel your breast as you move slowly up toward your collarbone.  Continue the up and down exam using all three pressures until you reach your armpit.  Write Down What You Find  Write down what is normal for each breast and any changes that you find. Keep a written record with breast changes or normal findings for each breast. By writing this information down, you do not need to depend only on memory for size, tenderness, or location. Write down where you are in your menstrual cycle, if you are still menstruating. If you are having trouble noticing differences in your breasts, do not get discouraged. With time you will become more familiar with the variations in your breasts and more comfortable with the exam. How often should I examine my breasts? Examine your breasts every month. If you are breastfeeding, the best time to examine your breasts  is after a feeding or after using a breast pump. If you menstruate, the best time to examine your breasts is 5-7 days after your period is over. During your period, your breasts are lumpier, and it may be more difficult to notice changes. When should I see my health care provider? See your health care provider if you notice:  A change in shape or size of your breasts or nipples.  A change in the skin of your breast or nipples, such as a reddened or scaly area.  Unusual discharge from your nipples.  A lump or thick  area that was not there before.  Pain in your breasts.  Anything that concerns you.  This information is not intended to replace advice given to you by your health care provider. Make sure you discuss any questions you have with your health care provider. Document Released: 02/20/2005 Document Revised: 07/29/2015 Document Reviewed: 01/10/2015 Elsevier Interactive Patient Education  [employer].

## 2017-01-19 ENCOUNTER — Other Ambulatory Visit: Payer: Self-pay | Admitting: Internal Medicine

## 2017-01-19 ENCOUNTER — Telehealth: Payer: Self-pay

## 2017-01-19 DIAGNOSIS — R928 Other abnormal and inconclusive findings on diagnostic imaging of breast: Secondary | ICD-10-CM

## 2017-01-19 NOTE — Telephone Encounter (Signed)
Patient called requesting diagnostic Mammo ordered to [facility] breast in [place]. Please Advise.

## 2017-01-23 ENCOUNTER — Other Ambulatory Visit: Payer: Self-pay

## 2017-01-23 DIAGNOSIS — R922 Inconclusive mammogram: Secondary | ICD-10-CM

## 2017-03-02 LAB — LIPID PANEL
Cholesterol: 170 (ref 0–200)
HDL: 41 (ref 35–70)
LDL CALC: 94
Triglycerides: 265 — AB (ref 40–160)

## 2017-03-02 LAB — VITAMIN D 25 HYDROXY (VIT D DEFICIENCY, FRACTURES): Vit D, 25-Hydroxy: 24

## 2017-03-02 LAB — CBC AND DIFFERENTIAL
HEMATOCRIT: 36 (ref 36–46)
HEMOGLOBIN: 11.5 — AB (ref 12.0–16.0)
Platelets: 374 (ref 150–399)
WBC: 6.2

## 2017-03-02 LAB — HEPATIC FUNCTION PANEL
ALT: 29 (ref 7–35)
AST: 31 (ref 13–35)
Alkaline Phosphatase: 90 (ref 25–125)
BILIRUBIN, TOTAL: 1.3

## 2017-03-02 LAB — BASIC METABOLIC PANEL
BUN: 17 (ref 4–21)
Creatinine: 0.8 (ref 0.5–1.1)
Glucose: 87

## 2017-03-02 LAB — TSH: TSH: 2.38 (ref 0.41–5.90)

## 2017-03-07 ENCOUNTER — Ambulatory Visit
Admission: RE | Admit: 2017-03-07 | Discharge: 2017-03-07 | Disposition: A | Discharge status: Home / Self Care | Payer: Self-pay | Source: Ambulatory Visit | Attending: Internal Medicine | Admitting: Internal Medicine

## 2017-03-07 DIAGNOSIS — R928 Other abnormal and inconclusive findings on diagnostic imaging of breast: Secondary | ICD-10-CM

## 2017-03-07 DIAGNOSIS — R922 Inconclusive mammogram: Secondary | ICD-10-CM

## 2017-03-08 ENCOUNTER — Encounter: Payer: Self-pay | Admitting: Internal Medicine

## 2017-03-08 ENCOUNTER — Ambulatory Visit (INDEPENDENT_AMBULATORY_CARE_PROVIDER_SITE_OTHER): Payer: Self-pay | Admitting: Internal Medicine

## 2017-03-08 VITALS — BP 136/88 | HR 71 | Ht 65.0 in | Wt 259.0 lb

## 2017-03-08 DIAGNOSIS — Z124 Encounter for screening for malignant neoplasm of cervix: Secondary | ICD-10-CM

## 2017-03-08 DIAGNOSIS — I10 Essential (primary) hypertension: Secondary | ICD-10-CM

## 2017-03-08 DIAGNOSIS — E782 Mixed hyperlipidemia: Secondary | ICD-10-CM

## 2017-03-08 DIAGNOSIS — Z0000 Encounter for general adult medical examination without abnormal findings: Secondary | ICD-10-CM

## 2017-03-08 DIAGNOSIS — J40 Bronchitis, not specified as acute or chronic: Secondary | ICD-10-CM

## 2017-03-08 DIAGNOSIS — E559 Vitamin D deficiency, unspecified: Secondary | ICD-10-CM

## 2017-03-08 MED ORDER — AZITHROMYCIN 250 MG PO TABS: ORAL_TABLET | ORAL | 0 refills | Status: DC

## 2017-03-08 NOTE — Patient Instructions (Signed)

## 2017-03-08 NOTE — Progress Notes (Signed)
Date:  03/08/2017   Name:  Judith Wood   DOB:  1955/11/12   MRN:  [id number]   Chief Complaint: Annual Exam Judith Wood is a 62 y.o. female who presents today for her Complete Annual Exam. She feels fairly well. She reports exercising some. She reports she is sleeping well. Recent mammogram was normal.  Recent labs are reviewed and are normal except for low vitamin D.  Hypertension  This is a chronic problem. The problem is controlled. Associated symptoms include palpitations. Pertinent negatives include no chest pain, headaches, peripheral edema or shortness of breath. There are no associated agents to hypertension. Past treatments include calcium channel blockers. There are no compliance problems.   Cough  This is a new problem. The current episode started 1 to 4 weeks ago. The problem has been unchanged. The problem occurs every few hours. The cough is productive of sputum. Pertinent negatives include no chest pain, chills, fever, headaches, rash, shortness of breath or wheezing.      Review of Systems  Constitutional: Negative for chills, fatigue and fever.  HENT: Negative for congestion, hearing loss, tinnitus, trouble swallowing and voice change.   Eyes: Negative for visual disturbance.  Respiratory: Positive for cough. Negative for chest tightness, shortness of breath and wheezing.   Cardiovascular: Positive for palpitations. Negative for chest pain and leg swelling.  Gastrointestinal: Negative for abdominal pain, constipation, diarrhea and vomiting.  Endocrine: Negative for polydipsia and polyuria.  Genitourinary: Negative for dysuria, frequency, genital sores, vaginal bleeding and vaginal discharge.  Musculoskeletal: Negative for arthralgias, gait problem and joint swelling.  Skin: Negative for color change and rash.  Neurological: Negative for dizziness, tremors, light-headedness and headaches.  Hematological: Negative for adenopathy. Does not bruise/bleed easily.    Psychiatric/Behavioral: Negative for dysphoric mood and sleep disturbance. The patient is not nervous/anxious.     Patient Active Problem List   Diagnosis Date Noted  . Seborrheic keratoses, inflamed 08/31/2016  . Hypertension 01/18/2016  . History of atrial fibrillation 01/18/2016  . Hyperlipidemia 01/18/2016  . 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 Vitamin D deficiency 01/18/2016  . Abnormal mammogram of right breast 01/18/2016    Prior to Admission medications   Medication Sig Start Date End Date Taking? Authorizing Provider  aspirin 81 MG chewable tablet Chew by mouth daily.    [provider]  b complex vitamins tablet Take 1 tablet by mouth daily.    [provider]  Calcium Citrate-Vitamin D (CALCIUM CITRATE + D PO) Take 4 tablets by mouth.    [provider]  cholecalciferol (VITAMIN D) 400 units TABS tablet Take 400 Units by mouth.    [provider]  diltiazem (DILACOR XR) 180 MG 24 hr capsule Take 1 capsule (180 mg total) by mouth daily. 08/31/16   Glean Hess, MD  ferrous sulfate 325 (65 FE) MG EC tablet Take 325 mg by mouth 3 (three) times daily with meals.    [provider]  glucosamine-chondroitin 500-400 MG tablet Take 1 tablet by mouth 3 (three) times daily.    [provider]  magnesium 30 MG tablet Take 30 mg by mouth 2 (two) times daily.    [provider]    Allergies  Allergen Reactions  . Norco [Hydrocodone-Acetaminophen]   . Tetracyclines & Related     Past Surgical History:  Procedure Laterality Date  . ABDOMINAL HYSTERECTOMY  2000   ovarian cyst; cervix remains  . CHOLECYSTECTOMY    . ROUX-EN-Y  GASTRIC BYPASS  2009  . WRIST SURGERY      Social History   Tobacco Use  . Smoking status: Never Smoker  . Smokeless tobacco: Never Used  Substance Use Topics  . Alcohol use: No  . Drug use: No     Medication list has been reviewed and updated.  PHQ 2/9 Scores  03/08/2017 01/18/2016  PHQ - 2 Score 0 0    Physical Exam  Constitutional: She is oriented to person, place, and time. She appears well-developed and well-nourished. No distress.  HENT:  Head: Normocephalic and atraumatic.  Right Ear: Tympanic membrane and ear canal normal.  Left Ear: Tympanic membrane and ear canal normal.  Nose: Right sinus exhibits no maxillary sinus tenderness. Left sinus exhibits no maxillary sinus tenderness.  Mouth/Throat: Uvula is midline and oropharynx is clear and moist.  Eyes: Conjunctivae and EOM are normal. Right eye exhibits no discharge. Left eye exhibits no discharge. No scleral icterus.  Neck: Normal range of motion. Carotid bruit is not present. No erythema present. No thyromegaly present.  Cardiovascular: Normal rate, regular rhythm, normal heart sounds and normal pulses.  Pulmonary/Chest: Effort normal. No respiratory distress. She has no wheezes.  Abdominal: Soft. Bowel sounds are normal. There is no hepatosplenomegaly. There is no tenderness. There is no CVA tenderness.  Genitourinary: Vagina normal. There is no tenderness, lesion or injury on the right labia. There is no tenderness, lesion or injury on the left labia. Cervix exhibits discharge (scant yellow). Cervix exhibits no motion tenderness and no friability. Right adnexum displays no mass. Left adnexum displays no mass.  Genitourinary Comments: Uterus and adnexa not palpated  Musculoskeletal: Normal range of motion.  Lymphadenopathy:    She has no cervical adenopathy.    She has no axillary adenopathy.  Neurological: She is alert and oriented to person, place, and time. She has normal reflexes. No cranial nerve deficit or sensory deficit.  Skin: Skin is warm, dry and intact. No rash noted.  Psychiatric: She has a normal mood and affect. Her speech is normal and behavior is normal. Thought content normal.  Nursing note and vitals reviewed.   BP 136/88   Pulse 71   Ht 5\' 5"  (1.651 m)   Wt 259  lb (117.5 kg)   SpO2 99%   BMI 43.10 kg/m   Assessment and Plan: 1. Annual physical exam Lab reviewed Mammogram up to date Pt declines Hep C testing  2. Essential hypertension Controlled Continue PRN extra diltiazem for palpitations  3. Vitamin D deficiency Continue supplements  4. Mixed hyperlipidemia Elevated triglycerides only  5. Encounter for Papanicolaou smear for cervical cancer screening - Pap IG (Image Guided)  6. Bronchitis - azithromycin (ZITHROMAX Z-PAK) 250 MG tablet; UAD  Dispense: 6 each; Refill: 0   Meds ordered this encounter  Medications  . azithromycin (ZITHROMAX Z-PAK) 250 MG tablet    Sig: UAD    Dispense:  6 each    Refill:  0    Partially dictated using Editor, commissioning. Any errors are unintentional.  Halina Maidens, MD [facility]  03/08/2017

## 2017-03-09 ENCOUNTER — Ambulatory Visit
Admission: EM | Admit: 2017-03-09 | Discharge: 2017-03-09 | Disposition: A | Discharge status: Home / Self Care | Payer: Self-pay | Attending: Family Medicine | Admitting: Family Medicine

## 2017-03-09 ENCOUNTER — Other Ambulatory Visit: Payer: Self-pay

## 2017-03-09 DIAGNOSIS — R002 Palpitations: Secondary | ICD-10-CM

## 2017-03-09 DIAGNOSIS — R059 Cough, unspecified: Secondary | ICD-10-CM

## 2017-03-09 DIAGNOSIS — E559 Vitamin D deficiency, unspecified: Secondary | ICD-10-CM | POA: Insufficient documentation

## 2017-03-09 DIAGNOSIS — I10 Essential (primary) hypertension: Secondary | ICD-10-CM

## 2017-03-09 DIAGNOSIS — I499 Cardiac arrhythmia, unspecified: Secondary | ICD-10-CM | POA: Insufficient documentation

## 2017-03-09 DIAGNOSIS — I4891 Unspecified atrial fibrillation: Secondary | ICD-10-CM | POA: Insufficient documentation

## 2017-03-09 DIAGNOSIS — Z9884 Bariatric surgery status: Secondary | ICD-10-CM | POA: Insufficient documentation

## 2017-03-09 DIAGNOSIS — Z7982 Long term (current) use of aspirin: Secondary | ICD-10-CM | POA: Insufficient documentation

## 2017-03-09 DIAGNOSIS — Z9049 Acquired absence of other specified parts of digestive tract: Secondary | ICD-10-CM | POA: Insufficient documentation

## 2017-03-09 DIAGNOSIS — R05 Cough: Secondary | ICD-10-CM | POA: Insufficient documentation

## 2017-03-09 DIAGNOSIS — E785 Hyperlipidemia, unspecified: Secondary | ICD-10-CM | POA: Insufficient documentation

## 2017-03-09 DIAGNOSIS — Z79899 Other long term (current) drug therapy: Secondary | ICD-10-CM | POA: Insufficient documentation

## 2017-03-09 LAB — PAP IG (IMAGE GUIDED): PAP Smear Comment: 0

## 2017-03-09 MED ORDER — AMOXICILLIN-POT CLAVULANATE 875-125 MG PO TABS: 1.0000 | ORAL_TABLET | Freq: Two times a day (BID) | ORAL | 0 refills | Status: DC

## 2017-03-09 NOTE — ED Triage Notes (Signed)
Patient complains of rapid heart rate and elevated blood pressure. Patient states that she has some noticed some mid chest pain. Patient states that she feels like her heart is beating "really hard." Patient reports a history of atrial fib.

## 2017-03-09 NOTE — ED Provider Notes (Signed)
[facility]    CSN: [id number] Arrival date & time: 03/09/17  1924     History   Chief Complaint Chief Complaint  Patient presents with  . Hypertension  . Irregular Heart Beat    HPI Judith Wood is a 62 y.o. female.   62 yo female with a c/o elevated blood pressures at home and rapid heart rate sensation and like her heart is "beating hard". Patient however denies chest pain or pressure. States has a h/o of atrial fibrillation in the past. Yesterday started taking zithromax for bronchitis.        Past Medical History:  Diagnosis Date  . Afib ([place])   . Hypertension     Patient Active Problem List   Diagnosis Date Noted  . Seborrheic keratoses, inflamed 08/31/2016  . Hypertension 01/18/2016  . History of atrial fibrillation 01/18/2016  . Hyperlipidemia 01/18/2016  . 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 Vitamin D deficiency 01/18/2016  . Abnormal mammogram of right breast 01/18/2016    Past Surgical History:  Procedure Laterality Date  . ABDOMINAL HYSTERECTOMY  2000   ovarian cyst; cervix remains  . CHOLECYSTECTOMY    . ROUX-EN-Y GASTRIC BYPASS  2009  . WRIST SURGERY      OB History    No data available       Home Medications    Prior to Admission medications   Medication Sig Start Date End Date Taking? Authorizing Provider  aspirin 81 MG chewable tablet Chew by mouth daily.   Yes [provider]  azithromycin (ZITHROMAX Z-PAK) 250 MG tablet UAD 03/08/17  Yes Glean Hess, MD  b complex vitamins tablet Take 1 tablet by mouth daily.   Yes [provider]  Calcium Citrate-Vitamin D (CALCIUM CITRATE + D PO) Take 4 tablets by mouth.   Yes [provider]  cholecalciferol (VITAMIN D) 400 units TABS tablet Take 400 Units by mouth.   Yes [provider]  diltiazem (DILACOR XR) 180 MG 24 hr capsule Take 1 capsule (180 mg total) by mouth daily. 08/31/16  Yes Glean Hess, MD  ferrous sulfate  325 (65 FE) MG EC tablet Take 325 mg by mouth 3 (three) times daily with meals.   Yes [provider]  glucosamine-chondroitin 500-400 MG tablet Take 1 tablet by mouth 3 (three) times daily.   Yes [provider]  magnesium 30 MG tablet Take 30 mg by mouth 2 (two) times daily.   Yes [provider]  amoxicillin-clavulanate (AUGMENTIN) 875-125 MG tablet Take 1 tablet by mouth 2 (two) times daily. 03/09/17   Norval Gable, MD    Family History Family History  Problem Relation Age of Onset  . Hypertension Mother   . Heart attack Father 107    Social History Social History   Tobacco Use  . Smoking status: Never Smoker  . Smokeless tobacco: Never Used  Substance Use Topics  . Alcohol use: No  . Drug use: No     Allergies   Norco [hydrocodone-acetaminophen] and Tetracyclines & related   Review of Systems Review of Systems   Physical Exam Triage Vital Signs ED Triage Vitals  Enc Vitals Group     BP 03/09/17 1936 (!) 142/69     Pulse Rate 03/09/17 1936 71     Resp 03/09/17 1936 16     Temp 03/09/17 1936 97.9 F (36.6 C)     Temp Source 03/09/17 1936 Oral     SpO2 03/09/17 1936  99 %     Weight 03/09/17 1933 259 lb (117.5 kg)     Height 03/09/17 1933 5\' 5"  (1.651 m)     Head Circumference --      Peak Flow --      Pain Score 03/09/17 1933 0     Pain Loc --      Pain Edu? --      Excl. in [place]? --    No data found.  Updated Vital Signs BP (!) 142/69 (BP Location: Left Arm)   Pulse 71   Temp 97.9 F (36.6 C) (Oral)   Resp 16   Ht 5\' 5"  (1.651 m)   Wt 259 lb (117.5 kg)   SpO2 99%   BMI 43.10 kg/m   Visual Acuity Right Eye Distance:   Left Eye Distance:   Bilateral Distance:    Right Eye Near:   Left Eye Near:    Bilateral Near:     Physical Exam  Constitutional: She appears well-developed and well-nourished. No distress.  HENT:  Head: Normocephalic and atraumatic.  Nose: No mucosal edema, rhinorrhea, nose lacerations, sinus  tenderness, nasal deformity, septal deviation or nasal septal hematoma. No epistaxis.  No foreign bodies.  Mouth/Throat: Uvula is midline, oropharynx is clear and moist and mucous membranes are normal. No oropharyngeal exudate.  Neck: Normal range of motion. Neck supple. No thyromegaly present.  Cardiovascular: Normal rate, regular rhythm and normal heart sounds.  No murmur heard. Pulmonary/Chest: Effort normal and breath sounds normal. No stridor. No respiratory distress. She has no wheezes. She has no rales.  Lymphadenopathy:    She has no cervical adenopathy.  Skin: She is not diaphoretic.  Nursing note and vitals reviewed.    UC Treatments / Results  Labs (all labs ordered are listed, but only abnormal results are displayed) Labs Reviewed - No data to display  EKG  EKG Interpretation None       Radiology No results found.  Procedures ED EKG Date/Time: 03/09/2017 9:00 PM Performed by: Norval Gable, MD Authorized by: Norval Gable, MD   ECG reviewed by ED Physician in the absence of a cardiologist: yes   Previous ECG:    Previous ECG:  Unavailable Interpretation:    Interpretation: normal   Rate:    ECG rate:  72   ECG rate assessment: normal   Rhythm:    Rhythm: sinus rhythm   Ectopy:    Ectopy: none   QRS:    QRS axis:  Normal Conduction:    Conduction: normal   ST segments:    ST segments:  Normal T waves:    T waves: normal     (including critical care time)  Medications Ordered in UC Medications - No data to display   Initial Impression / Assessment and Plan / UC Course  I have reviewed the triage vital signs and the nursing notes.  Pertinent labs & imaging results that were available during my care of the patient were reviewed by me and considered in my medical decision making (see chart for details).       Final Clinical Impressions(s) / UC Diagnoses   Final diagnoses:  Cough  Essential hypertension  Palpitations    ED Discharge  Orders        Ordered    amoxicillin-clavulanate (AUGMENTIN) 875-125 MG tablet  2 times daily     03/09/17 2016     1. ekg result (normal) and diagnosis reviewed with patient 2. rx as per orders above;  reviewed possible side effects, interactions, risks and benefits; recommend stopping zithromax 3. Follow-up prn if symptoms worsen or don't improve  Controlled Substance Prescriptions  Controlled Substance Registry consulted? Not Applicable   Norval Gable, MD 03/09/17 2100

## 2017-03-09 NOTE — Discharge Instructions (Signed)
Stop zithromax (Z-pak)

## 2017-09-05 ENCOUNTER — Other Ambulatory Visit: Payer: Self-pay

## 2017-09-05 DIAGNOSIS — I10 Essential (primary) hypertension: Secondary | ICD-10-CM

## 2017-09-05 MED ORDER — DILTIAZEM HCL ER 180 MG PO CP24: 180.0000 mg | ORAL_CAPSULE | Freq: Every day | ORAL | 3 refills | Status: DC

## 2017-10-01 DIAGNOSIS — I10 Essential (primary) hypertension: Secondary | ICD-10-CM | POA: Insufficient documentation

## 2018-03-05 LAB — BASIC METABOLIC PANEL
BUN: 19 (ref 4–21)
CREATININE: 0.8 (ref 0.5–1.1)
GLUCOSE: 90
POTASSIUM: 4.2 (ref 3.4–5.3)
Sodium: 136 — AB (ref 137–147)

## 2018-03-05 LAB — CBC AND DIFFERENTIAL
HCT: 40 (ref 36–46)
Hemoglobin: 13.3 (ref 12.0–16.0)
Platelets: 361 (ref 150–399)

## 2018-03-05 LAB — IRON,TIBC AND FERRITIN PANEL: IRON: 63

## 2018-03-05 LAB — HEPATIC FUNCTION PANEL
ALT: 35 (ref 7–35)
AST: 31 (ref 13–35)
Alkaline Phosphatase: 95 (ref 25–125)
BILIRUBIN, TOTAL: 0.5

## 2018-03-05 LAB — LIPID PANEL
CHOLESTEROL: 169 (ref 0–200)
HDL: 48 (ref 35–70)
LDL Cholesterol: 94
Triglycerides: 168 — AB (ref 40–160)

## 2018-03-05 LAB — VITAMIN D 25 HYDROXY (VIT D DEFICIENCY, FRACTURES): VIT D 25 HYDROXY: 47

## 2018-03-05 LAB — TSH: TSH: 2.77 (ref 0.41–5.90)

## 2018-03-11 ENCOUNTER — Ambulatory Visit (INDEPENDENT_AMBULATORY_CARE_PROVIDER_SITE_OTHER): Payer: Self-pay | Admitting: Internal Medicine

## 2018-03-11 ENCOUNTER — Encounter: Payer: Self-pay | Admitting: Internal Medicine

## 2018-03-11 VITALS — BP 118/70 | HR 75 | Ht 65.0 in | Wt 231.0 lb

## 2018-03-11 DIAGNOSIS — I10 Essential (primary) hypertension: Secondary | ICD-10-CM

## 2018-03-11 DIAGNOSIS — Z0000 Encounter for general adult medical examination without abnormal findings: Secondary | ICD-10-CM

## 2018-03-11 DIAGNOSIS — I480 Paroxysmal atrial fibrillation: Secondary | ICD-10-CM

## 2018-03-11 DIAGNOSIS — Z1231 Encounter for screening mammogram for malignant neoplasm of breast: Secondary | ICD-10-CM

## 2018-03-11 NOTE — Patient Instructions (Signed)
Goal BMI less than 30.  Health Maintenance, Female Adopting a healthy lifestyle and getting preventive care can go a long way to promote health and wellness. Talk with your health care provider about what schedule of regular examinations is right for you. This is a good chance for you to check in with your provider about disease prevention and staying healthy. In between checkups, there are plenty of things you can do on your own. Experts have done a lot of research about which lifestyle changes and preventive measures are most likely to keep you healthy. Ask your health care provider for more information. Weight and diet Eat a healthy diet  Be sure to include plenty of vegetables, fruits, low-fat dairy products, and lean protein.  Do not eat a lot of foods high in solid fats, added sugars, or salt.  Get regular exercise. This is one of the most important things you can do for your health. ? Most adults should exercise for at least 150 minutes each week. The exercise should increase your heart rate and make you sweat (moderate-intensity exercise). ? Most adults should also do strengthening exercises at least twice a week. This is in addition to the moderate-intensity exercise. Maintain a healthy weight  Body mass index (BMI) is a measurement that can be used to identify possible weight problems. It estimates body fat based on height and weight. Your health care provider can help determine your BMI and help you achieve or maintain a healthy weight.  For females 24 years of age and older: ? A BMI below 18.5 is considered underweight. ? A BMI of 18.5 to 24.9 is normal. ? A BMI of 25 to 29.9 is considered overweight. ? A BMI of 30 and above is considered obese. Watch levels of cholesterol and blood lipids  You should start having your blood tested for lipids and cholesterol at 62 years of age, then have this test every 5 years.  You may need to have your cholesterol levels checked more often  if: ? Your lipid or cholesterol levels are high. ? You are older than 62 years of age. ? You are at high risk for heart disease. Cancer screening Lung Cancer  Lung cancer screening is recommended for adults 69-20 years old who are at high risk for lung cancer because of a history of smoking.  A yearly low-dose CT scan of the lungs is recommended for people who: ? Currently smoke. ? Have quit within the past 15 years. ? Have at least a 30-pack-year history of smoking. A pack year is smoking an average of one pack of cigarettes a day for 1 year.  Yearly screening should continue until it has been 15 years since you quit.  Yearly screening should stop if you develop a health problem that would prevent you from having lung cancer treatment. Breast Cancer  Practice breast self-awareness. This means understanding how your breasts normally appear and feel.  It also means doing regular breast self-exams. Let your health care provider know about any changes, no matter how small.  If you are in your 20s or 30s, you should have a clinical breast exam (CBE) by a health care provider every 1-3 years as part of a regular health exam.  If you are 29 or older, have a CBE every year. Also consider having a breast X-ray (mammogram) every year.  If you have a family history of breast cancer, talk to your health care provider about genetic screening.  If you are at  high risk for breast cancer, talk to your health care provider about having an MRI and a mammogram every year.  Breast cancer gene (BRCA) assessment is recommended for women who have family members with BRCA-related cancers. BRCA-related cancers include: ? Breast. ? Ovarian. ? Tubal. ? Peritoneal cancers.  Results of the assessment will determine the need for genetic counseling and BRCA1 and BRCA2 testing. Cervical Cancer Your health care provider may recommend that you be screened regularly for cancer of the pelvic organs (ovaries,  uterus, and vagina). This screening involves a pelvic examination, including checking for microscopic changes to the surface of your cervix (Pap test). You may be encouraged to have this screening done every 3 years, beginning at age 73.  For women ages 104-65, health care providers may recommend pelvic exams and Pap testing every 3 years, or they may recommend the Pap and pelvic exam, combined with testing for human papilloma virus (HPV), every 5 years. Some types of HPV increase your risk of cervical cancer. Testing for HPV may also be done on women of any age with unclear Pap test results.  Other health care providers may not recommend any screening for nonpregnant women who are considered low risk for pelvic cancer and who do not have symptoms. Ask your health care provider if a screening pelvic exam is right for you.  If you have had past treatment for cervical cancer or a condition that could lead to cancer, you need Pap tests and screening for cancer for at least 20 years after your treatment. If Pap tests have been discontinued, your risk factors (such as having a new sexual partner) need to be reassessed to determine if screening should resume. Some women have medical problems that increase the chance of getting cervical cancer. In these cases, your health care provider may recommend more frequent screening and Pap tests. Colorectal Cancer  This type of cancer can be detected and often prevented.  Routine colorectal cancer screening usually begins at 62 years of age and continues through 62 years of age.  Your health care provider may recommend screening at an earlier age if you have risk factors for colon cancer.  Your health care provider may also recommend using home test kits to check for hidden blood in the stool.  A small camera at the end of a tube can be used to examine your colon directly (sigmoidoscopy or colonoscopy). This is done to check for the earliest forms of colorectal  cancer.  Routine screening usually begins at age 58.  Direct examination of the colon should be repeated every 5-10 years through 62 years of age. However, you may need to be screened more often if early forms of precancerous polyps or small growths are found. Skin Cancer  Check your skin from head to toe regularly.  Tell your health care provider about any new moles or changes in moles, especially if there is a change in a mole's shape or color.  Also tell your health care provider if you have a mole that is larger than the size of a pencil eraser.  Always use sunscreen. Apply sunscreen liberally and repeatedly throughout the day.  Protect yourself by wearing long sleeves, pants, a wide-brimmed hat, and sunglasses whenever you are outside. Heart disease, diabetes, and high blood pressure  High blood pressure causes heart disease and increases the risk of stroke. High blood pressure is more likely to develop in: ? People who have blood pressure in the high end of the normal  range (130-139/85-89 mm Hg). ? People who are overweight or obese. ? People who are African American.  If you are 84-54 years of age, have your blood pressure checked every 3-5 years. If you are 64 years of age or older, have your blood pressure checked every year. You should have your blood pressure measured twice-once when you are at a hospital or clinic, and once when you are not at a hospital or clinic. Record the average of the two measurements. To check your blood pressure when you are not at a hospital or clinic, you can use: ? An automated blood pressure machine at a pharmacy. ? A home blood pressure monitor.  If you are between 43 years and 48 years old, ask your health care provider if you should take aspirin to prevent strokes.  Have regular diabetes screenings. This involves taking a blood sample to check your fasting blood sugar level. ? If you are at a normal weight and have a low risk for diabetes,  have this test once every three years after 62 years of age. ? If you are overweight and have a high risk for diabetes, consider being tested at a younger age or more often. Preventing infection Hepatitis B  If you have a higher risk for hepatitis B, you should be screened for this virus. You are considered at high risk for hepatitis B if: ? You were born in a country where hepatitis B is common. Ask your health care provider which countries are considered high risk. ? Your parents were born in a high-risk country, and you have not been immunized against hepatitis B (hepatitis B vaccine). ? You have HIV or AIDS. ? You use needles to inject street drugs. ? You live with someone who has hepatitis B. ? You have had sex with someone who has hepatitis B. ? You get hemodialysis treatment. ? You take certain medicines for conditions, including cancer, organ transplantation, and autoimmune conditions. Hepatitis C  Blood testing is recommended for: ? Everyone born from [birth year] through 1964. ? Anyone with known risk factors for hepatitis C. Sexually transmitted infections (STIs)  You should be screened for sexually transmitted infections (STIs) including gonorrhea and chlamydia if: ? You are sexually active and are younger than 61 years of age. ? You are older than 62 years of age and your health care provider tells you that you are at risk for this type of infection. ? Your sexual activity has changed since you were last screened and you are at an increased risk for chlamydia or gonorrhea. Ask your health care provider if you are at risk.  If you do not have HIV, but are at risk, it may be recommended that you take a prescription medicine daily to prevent HIV infection. This is called pre-exposure prophylaxis (PrEP). You are considered at risk if: ? You are sexually active and do not regularly use condoms or know the HIV status of your partner(s). ? You take drugs by injection. ? You are sexually  active with a partner who has HIV. Talk with your health care provider about whether you are at high risk of being infected with HIV. If you choose to begin PrEP, you should first be tested for HIV. You should then be tested every 3 months for as long as you are taking PrEP. Pregnancy  If you are premenopausal and you may become pregnant, ask your health care provider about preconception counseling.  If you may become pregnant, take 400 to 800  micrograms (mcg) of folic acid every day.  If you want to prevent pregnancy, talk to your health care provider about birth control (contraception). Osteoporosis and menopause  Osteoporosis is a disease in which the bones lose minerals and strength with aging. This can result in serious bone fractures. Your risk for osteoporosis can be identified using a bone density scan.  If you are 85 years of age or older, or if you are at risk for osteoporosis and fractures, ask your health care provider if you should be screened.  Ask your health care provider whether you should take a calcium or vitamin D supplement to lower your risk for osteoporosis.  Menopause may have certain physical symptoms and risks.  Hormone replacement therapy may reduce some of these symptoms and risks. Talk to your health care provider about whether hormone replacement therapy is right for you. Follow these instructions at home:  Schedule regular health, dental, and eye exams.  Stay current with your immunizations.  Do not use any tobacco products including cigarettes, chewing tobacco, or electronic cigarettes.  If you are pregnant, do not drink alcohol.  If you are breastfeeding, limit how much and how often you drink alcohol.  Limit alcohol intake to no more than 1 drink per day for nonpregnant women. One drink equals 12 ounces of beer, 5 ounces of wine, or 1 ounces of hard liquor.  Do not use street drugs.  Do not share needles.  Ask your health care provider for  help if you need support or information about quitting drugs.  Tell your health care provider if you often feel depressed.  Tell your health care provider if you have ever been abused or do not feel safe at home. This information is not intended to replace advice given to you by your health care provider. Make sure you discuss any questions you have with your health care provider. Document Released: 09/05/2010 Document Revised: 07/29/2015 Document Reviewed: 11/24/2014 Elsevier Interactive Patient Education  2019 [employer].

## 2018-03-11 NOTE — Progress Notes (Signed)
Date:  03/11/2018   Name:  Judith Wood   DOB:  June 18, 1955   MRN:  [id number]   Chief Complaint: Annual Exam Judith Wood is a 63 y.o. female who presents today for her Complete Annual Exam. She feels well. She reports exercising walking. She reports she is sleeping fairly well. She is due now for annual mammogram - denies breast problems.  She is overdue for colonoscopy but does not have medical insurance.  She does not want influenza or tetanus vaccines today. She has had labs last month which are reviewed today and are normal. She has been working on diet for the past year and has lost almost 30 lbs. Using weight watchers.  We discussed goal should be BMI < 30.  Hypertension  This is a chronic problem. The problem is controlled. Associated symptoms include palpitations (has intermittent Afib.). Pertinent negatives include no chest pain, headaches or shortness of breath. Past treatments include calcium channel blockers. The current treatment provides significant improvement. There are no compliance problems.   Paroxysmal atrial fibrillation - last episode several months ago lasting a few hours.  Seen by cardiology and PRN flecanide added.   Lab Results  Component Value Date   CREATININE 0.8 03/05/2018   BUN 19 03/05/2018   NA 136 (A) 03/05/2018   K 4.2 03/05/2018   Lab Results  Component Value Date   CHOL 169 03/05/2018   HDL 48 03/05/2018   LDLCALC 94 03/05/2018   TRIG 168 (A) 03/05/2018   Lab Results  Component Value Date   WBC 6.2 03/02/2017   HGB 13.3 03/05/2018   HCT 40 03/05/2018   PLT 361 03/05/2018   Lab Results  Component Value Date   TSH 2.77 03/05/2018    Review of Systems  Constitutional: Negative for chills, fatigue and fever.  HENT: Negative for congestion, hearing loss, tinnitus, trouble swallowing and voice change.   Eyes: Negative for visual disturbance.  Respiratory: Negative for cough, chest tightness, shortness of breath and wheezing.     Cardiovascular: Positive for palpitations (has intermittent Afib.). Negative for chest pain and leg swelling.  Gastrointestinal: Positive for constipation. Negative for abdominal pain, diarrhea and vomiting.  Endocrine: Negative for polydipsia and polyuria.  Genitourinary: Positive for urgency. Negative for dysuria, frequency, genital sores, vaginal bleeding and vaginal discharge.  Musculoskeletal: Negative for arthralgias, gait problem and joint swelling.  Skin: Negative for color change and rash.  Allergic/Immunologic: Negative for environmental allergies.  Neurological: Negative for dizziness, tremors, light-headedness and headaches.  Hematological: Negative for adenopathy. Does not bruise/bleed easily.  Psychiatric/Behavioral: Negative for dysphoric mood and sleep disturbance. The patient is not nervous/anxious.     Patient Active Problem List   Diagnosis Date Noted  . Seborrheic keratoses, inflamed 08/31/2016  . Hypertension 01/18/2016  . History of atrial fibrillation 01/18/2016  . Hyperlipidemia 01/18/2016  . 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 Vitamin D deficiency 01/18/2016  . Abnormal mammogram of right breast 01/18/2016    Allergies  Allergen Reactions  . Norco [Hydrocodone-Acetaminophen]   . Tetracyclines & Related     Past Surgical History:  Procedure Laterality Date  . ABDOMINAL HYSTERECTOMY  2000   ovarian cyst; cervix remains  . CHOLECYSTECTOMY    . ROUX-EN-Y GASTRIC BYPASS  2009  . WRIST SURGERY      Social History   Tobacco Use  . Smoking status: Never Smoker  . Smokeless tobacco: Never Used  Substance Use Topics  . Alcohol use: No  .  Drug use: No     Medication list has been reviewed and updated.  Current Meds  Medication Sig  . aspirin 81 MG chewable tablet Chew by mouth daily.  Marland Kitchen b complex vitamins tablet Take 1 tablet by mouth daily.  . Calcium Citrate-Vitamin D (CALCIUM CITRATE + D PO) Take 4 tablets by mouth.  .  cholecalciferol (VITAMIN D) 400 units TABS tablet Take 400 Units by mouth.  . diltiazem (DILACOR XR) 180 MG 24 hr capsule Take 1 capsule (180 mg total) by mouth daily.  . ferrous sulfate 325 (65 FE) MG EC tablet Take 325 mg by mouth daily with breakfast.   . flecainide (TAMBOCOR) 150 MG tablet Take 150 mg by mouth 2 (two) times daily as needed.  Marland Kitchen glucosamine-chondroitin 500-400 MG tablet Take 1 tablet by mouth 3 (three) times daily.  . magnesium 30 MG tablet Take 30 mg by mouth 2 (two) times daily.    PHQ 2/9 Scores 03/11/2018 03/08/2017 03/08/2017 01/18/2016  PHQ - 2 Score 0 0 0 0    Physical Exam Vitals signs and nursing note reviewed.  Constitutional:      General: She is not in acute distress.    Appearance: She is well-developed.  HENT:     Head: Normocephalic and atraumatic.     Right Ear: Tympanic membrane and ear canal normal.     Left Ear: Tympanic membrane and ear canal normal.     Nose:     Right Sinus: No maxillary sinus tenderness.     Left Sinus: No maxillary sinus tenderness.     Mouth/Throat:     Pharynx: Uvula midline.  Eyes:     General: No scleral icterus.       Right eye: No discharge.        Left eye: No discharge.     Conjunctiva/sclera: Conjunctivae normal.  Neck:     Musculoskeletal: Normal range of motion. No erythema.     Thyroid: No thyromegaly.     Vascular: No carotid bruit.  Cardiovascular:     Rate and Rhythm: Normal rate and regular rhythm.     Pulses: Normal pulses.     Heart sounds: Normal heart sounds.  Pulmonary:     Effort: Pulmonary effort is normal. No respiratory distress.     Breath sounds: No wheezing.  Chest:     Breasts:        Right: No mass, nipple discharge, skin change or tenderness.        Left: No mass, nipple discharge, skin change or tenderness.  Abdominal:     General: Bowel sounds are normal.     Palpations: Abdomen is soft.     Tenderness: There is no abdominal tenderness.  Musculoskeletal: Normal range of motion.    Lymphadenopathy:     Cervical: No cervical adenopathy.  Skin:    General: Skin is warm and dry.     Findings: No rash.  Neurological:     Mental Status: She is alert and oriented to person, place, and time.     Cranial Nerves: No cranial nerve deficit.     Sensory: No sensory deficit.     Deep Tendon Reflexes: Reflexes are normal and symmetric.  Psychiatric:        Speech: Speech normal.        Behavior: Behavior normal.        Thought Content: Thought content normal.    Wt Readings from Last 3 Encounters:  03/11/18 231 lb (104.8 kg)  03/09/17 259 lb (117.5 kg)  03/08/17 259 lb (117.5 kg)    BP 118/70 (BP Location: Right Arm, Patient Position: Sitting, Cuff Size: Large)   Pulse 75   Ht 5\' 5"  (1.651 m)   Wt 231 lb (104.8 kg)   SpO2 99%   BMI 38.44 kg/m   Assessment and Plan: 1. Annual physical exam Normal exam except for weight which is improving - goal BMI < 30  2. Encounter for screening mammogram for breast cancer Pt to schedule at [facility]; Future  3. Essential hypertension Controlled on diltiazem  4. Paroxysmal atrial fibrillation ([place]) Followed by cardiology Has Flecanide to use PRN   Partially dictated using Editor, commissioning. Any errors are unintentional.  Halina Maidens, MD [facility]  03/11/2018

## 2018-05-31 ENCOUNTER — Other Ambulatory Visit: Payer: Self-pay

## 2018-05-31 ENCOUNTER — Encounter: Payer: Self-pay | Admitting: Internal Medicine

## 2018-05-31 ENCOUNTER — Ambulatory Visit (INDEPENDENT_AMBULATORY_CARE_PROVIDER_SITE_OTHER): Payer: Self-pay | Admitting: Internal Medicine

## 2018-05-31 VITALS — Ht 65.0 in | Wt 231.0 lb

## 2018-05-31 DIAGNOSIS — N3001 Acute cystitis with hematuria: Secondary | ICD-10-CM

## 2018-05-31 MED ORDER — CIPROFLOXACIN HCL 250 MG PO TABS: 250.0000 mg | ORAL_TABLET | Freq: Two times a day (BID) | ORAL | 0 refills | Status: AC

## 2018-05-31 NOTE — Progress Notes (Signed)
Date:  05/31/2018   Name:  Judith Wood   DOB:  January 11, 1956   MRN:  [id number]  I connected with this patient, Judith Wood, by telephone at the patient's home . I verified that I am speaking with the correct person using two identifiers. This visit was conducted via telephone due to the Covid-19 outbreak from my office at [facility] in [place], [place]. I discussed the limitations, risks, security and privacy concerns of performing an evaluation and management service by telephone. I also discussed with the patient that there may be a patient responsible charge related to this service. The patient expressed understanding and agreed to proceed.  Chief Complaint: Urinary Tract Infection (Urination pain, and cannot empty bladder. Had before with same feelings- thinks its a UTI. X 2 weeks. No fever. )  Urinary Tract Infection   This is a new problem. The current episode started 1 to 4 weeks ago. The problem occurs every urination. The problem has been gradually worsening. The quality of the pain is described as burning. There has been no fever. Associated symptoms include flank pain, frequency, hematuria and urgency. Pertinent negatives include no chills. She has tried increased fluids for the symptoms.  Symptoms are similar to previous UTIs.  Review of Systems  Constitutional: Negative for chills, fatigue and fever.  Respiratory: Negative for chest tightness, shortness of breath and wheezing.   Cardiovascular: Negative for chest pain and palpitations.  Gastrointestinal: Negative for abdominal pain, blood in stool and constipation.  Genitourinary: Positive for flank pain, frequency, hematuria and urgency.    Patient Active Problem List   Diagnosis Date Noted  . Seborrheic keratoses, inflamed 08/31/2016  . Hypertension 01/18/2016  . Paroxysmal atrial fibrillation ([place]) 01/18/2016  . Hyperlipidemia 01/18/2016  . 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 Vitamin D deficiency  01/18/2016  . Abnormal mammogram of right breast 01/18/2016    Allergies  Allergen Reactions  . Norco [Hydrocodone-Acetaminophen]   . Tetracyclines & Related     Past Surgical History:  Procedure Laterality Date  . ABDOMINAL HYSTERECTOMY  2000   ovarian cyst; cervix remains  . CHOLECYSTECTOMY    . ROUX-EN-Y GASTRIC BYPASS  2009  . WRIST SURGERY      Social History   Tobacco Use  . Smoking status: Never Smoker  . Smokeless tobacco: Never Used  Substance Use Topics  . Alcohol use: No  . Drug use: No     Medication list has been reviewed and updated.  Current Meds  Medication Sig  . aspirin 81 MG chewable tablet Chew by mouth daily.  Marland Kitchen b complex vitamins tablet Take 1 tablet by mouth daily.  . Calcium Citrate-Vitamin D (CALCIUM CITRATE + D PO) Take 4 tablets by mouth.  . cholecalciferol (VITAMIN D) 400 units TABS tablet Take 400 Units by mouth.  . diltiazem (DILACOR XR) 180 MG 24 hr capsule Take 1 capsule (180 mg total) by mouth daily.  . ferrous sulfate 325 (65 FE) MG EC tablet Take 325 mg by mouth daily with breakfast.   . glucosamine-chondroitin 500-400 MG tablet Take 1 tablet by mouth 3 (three) times daily.  . magnesium 30 MG tablet Take 30 mg by mouth 2 (two) times daily.    PHQ 2/9 Scores 05/31/2018 03/11/2018 03/08/2017 03/08/2017  PHQ - 2 Score 0 0 0 0    BP Readings from Last 3 Encounters:  03/11/18 118/70  03/09/17 (!) 142/69  03/08/17 136/88    Physical Exam Neurological:  Mental Status: She is alert.  Psychiatric:        Attention and Perception: Attention normal.        Mood and Affect: Mood normal.     Wt Readings from Last 3 Encounters:  05/31/18 231 lb (104.8 kg)  03/11/18 231 lb (104.8 kg)  03/09/17 259 lb (117.5 kg)    Ht 5\' 5"  (1.651 m)   Wt 231 lb (104.8 kg)   BMI 38.44 kg/m   Assessment and Plan: 1. Acute cystitis with hematuria Continue to push fluids May need OV if sx persist - ciprofloxacin (CIPRO) 250 MG tablet; Take 1  tablet (250 mg total) by mouth 2 (two) times daily for 5 days.  Dispense: 10 tablet; Refill: 0  I spent 7 minutes on this encounter.  Partially dictated using Editor, commissioning. Any errors are unintentional.  Halina Maidens, MD [facility]  05/31/2018

## 2018-07-03 ENCOUNTER — Encounter: Payer: Self-pay | Admitting: Internal Medicine

## 2018-07-03 ENCOUNTER — Other Ambulatory Visit: Payer: Self-pay

## 2018-07-03 ENCOUNTER — Ambulatory Visit (INDEPENDENT_AMBULATORY_CARE_PROVIDER_SITE_OTHER): Payer: Self-pay | Admitting: Internal Medicine

## 2018-07-03 VITALS — BP 128/70 | HR 82 | Ht 65.0 in | Wt 228.0 lb

## 2018-07-03 DIAGNOSIS — N390 Urinary tract infection, site not specified: Secondary | ICD-10-CM

## 2018-07-03 LAB — POC URINALYSIS WITH MICROSCOPIC (NON AUTO)MANUAL RESULT
Bilirubin, UA: NEGATIVE
Crystals: 0
Epithelial cells, urine per micros: 2
Glucose, UA: NEGATIVE
Ketones, UA: NEGATIVE
Mucus, UA: 0
Nitrite, UA: NEGATIVE
Protein, UA: NEGATIVE
RBC: 0 M/uL — AB (ref 4.04–5.48)
Spec Grav, UA: 1.015 (ref 1.010–1.025)
Urobilinogen, UA: 0.2 E.U./dL
WBC Casts, UA: 100
pH, UA: 6 (ref 5.0–8.0)

## 2018-07-03 MED ORDER — NITROFURANTOIN MONOHYD MACRO 100 MG PO CAPS: 100.0000 mg | ORAL_CAPSULE | Freq: Two times a day (BID) | ORAL | 0 refills | Status: AC

## 2018-07-03 NOTE — Progress Notes (Signed)
Date:  07/03/2018   Name:  Judith Wood   DOB:  Jun 06, 1955   MRN:  [id number]   Chief Complaint: Urinary Tract Infection (Bladder pain when urinating. Pelvic pain. )  Dysuria   This is a new problem. The current episode started in the past 7 days. The problem has been unchanged. The quality of the pain is described as burning and aching (low pelvic discomfort). The pain is mild. There has been no fever. Associated symptoms include frequency. Pertinent negatives include no chills, discharge, flank pain, hematuria, hesitancy, nausea or urgency. She has tried increased fluids (and cranberry plus D-mannose) for the symptoms.  She was treated with Cipro a month ago for UTI via telephone visit.  She felt better for about one week then sx resumed.  Review of Systems  Constitutional: Negative for chills.  Gastrointestinal: Negative for nausea.  Genitourinary: Positive for dysuria and frequency. Negative for flank pain, hematuria, hesitancy and urgency.    Patient Active Problem List   Diagnosis Date Noted  . Seborrheic keratoses, inflamed 08/31/2016  . Hypertension 01/18/2016  . Paroxysmal atrial fibrillation ([place]) 01/18/2016  . Hyperlipidemia 01/18/2016  . 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 Vitamin D deficiency 01/18/2016  . Abnormal mammogram of right breast 01/18/2016    Allergies  Allergen Reactions  . Norco [Hydrocodone-Acetaminophen]   . Tetracyclines & Related     Past Surgical History:  Procedure Laterality Date  . ABDOMINAL HYSTERECTOMY  2000   ovarian cyst; cervix remains  . CHOLECYSTECTOMY    . ROUX-EN-Y GASTRIC BYPASS  2009  . WRIST SURGERY      Social History   Tobacco Use  . Smoking status: Never Smoker  . Smokeless tobacco: Never Used  Substance Use Topics  . Alcohol use: No  . Drug use: No     Medication list has been reviewed and updated.  Current Meds  Medication Sig  . aspirin 81 MG chewable tablet Chew by mouth daily.  Marland Kitchen b  complex vitamins tablet Take 1 tablet by mouth daily.  . Calcium Citrate-Vitamin D (CALCIUM CITRATE + D PO) Take 4 tablets by mouth.  . cholecalciferol (VITAMIN D) 400 units TABS tablet Take 400 Units by mouth.  . diltiazem (DILACOR XR) 180 MG 24 hr capsule Take 1 capsule (180 mg total) by mouth daily.  . ferrous sulfate 325 (65 FE) MG EC tablet Take 325 mg by mouth daily with breakfast.   . glucosamine-chondroitin 500-400 MG tablet Take 1 tablet by mouth 3 (three) times daily.  . magnesium 30 MG tablet Take 30 mg by mouth 2 (two) times daily.    PHQ 2/9 Scores 07/03/2018 05/31/2018 03/11/2018 03/08/2017  PHQ - 2 Score 0 0 0 0  PHQ- 9 Score 0 - - -    BP Readings from Last 3 Encounters:  07/03/18 128/70  03/11/18 118/70  03/09/17 (!) 142/69    Physical Exam Vitals signs and nursing note reviewed.  Constitutional:      Appearance: She is well-developed.  Cardiovascular:     Rate and Rhythm: Normal rate and regular rhythm.     Heart sounds: Normal heart sounds.  Pulmonary:     Effort: Pulmonary effort is normal. No respiratory distress.     Breath sounds: Normal breath sounds.  Abdominal:     General: Bowel sounds are normal.     Palpations: Abdomen is soft.     Tenderness: There is abdominal tenderness in the suprapubic area. There is no guarding  or rebound.     Wt Readings from Last 3 Encounters:  07/03/18 228 lb (103.4 kg)  05/31/18 231 lb (104.8 kg)  03/11/18 231 lb (104.8 kg)    BP 128/70   Pulse 82   Ht 5\' 5"  (1.651 m)   Wt 228 lb (103.4 kg)   SpO2 98%   BMI 37.94 kg/m   Assessment and Plan: 1. Recurrent UTI Will hold on culture for now due to cost Pt will follow up if needed - POC urinalysis w microscopic (non auto) - nitrofurantoin, macrocrystal-monohydrate, (MACROBID) 100 MG capsule; Take 1 capsule (100 mg total) by mouth 2 (two) times daily for 7 days.  Dispense: 14 capsule; Refill: 0   Partially dictated using Editor, commissioning. Any errors are  unintentional.  Halina Maidens, MD [facility]  07/03/2018

## 2018-09-14 ENCOUNTER — Other Ambulatory Visit: Payer: Self-pay | Admitting: Internal Medicine

## 2018-09-14 DIAGNOSIS — I10 Essential (primary) hypertension: Secondary | ICD-10-CM

## 2018-12-24 ENCOUNTER — Other Ambulatory Visit: Payer: Self-pay | Admitting: Internal Medicine

## 2018-12-24 DIAGNOSIS — I10 Essential (primary) hypertension: Secondary | ICD-10-CM

## 2018-12-24 MED ORDER — DILTIAZEM HCL ER 180 MG PO CP24: 180.0000 mg | ORAL_CAPSULE | Freq: Every day | ORAL | 1 refills | Status: DC

## 2018-12-25 IMAGING — MG MM DIGITAL DIAGNOSTIC BILAT W/ TOMO W/ CAD
8 of 13 series · 8 of 29 positions shown · non-contrast
Comparison: Previous exam(s).

CLINICAL DATA: Follow-up of probably benign right breast nodule.

EXAM:
2D DIGITAL DIAGNOSTIC BILATERAL MAMMOGRAM WITH CAD AND ADJUNCT TOMO

[L MLO (1 of 2)]
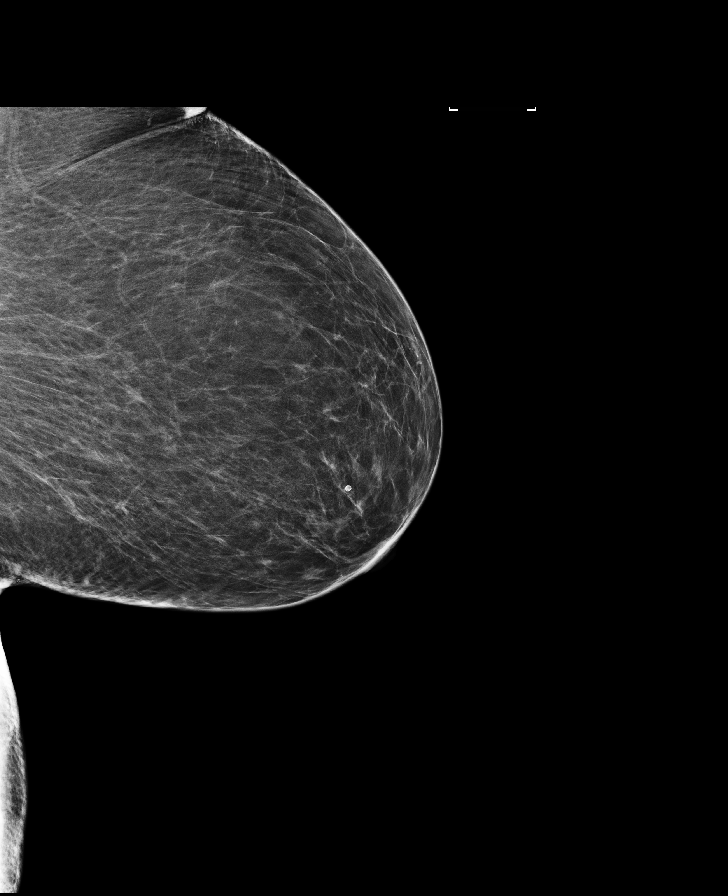

[R MLO synth-2D]
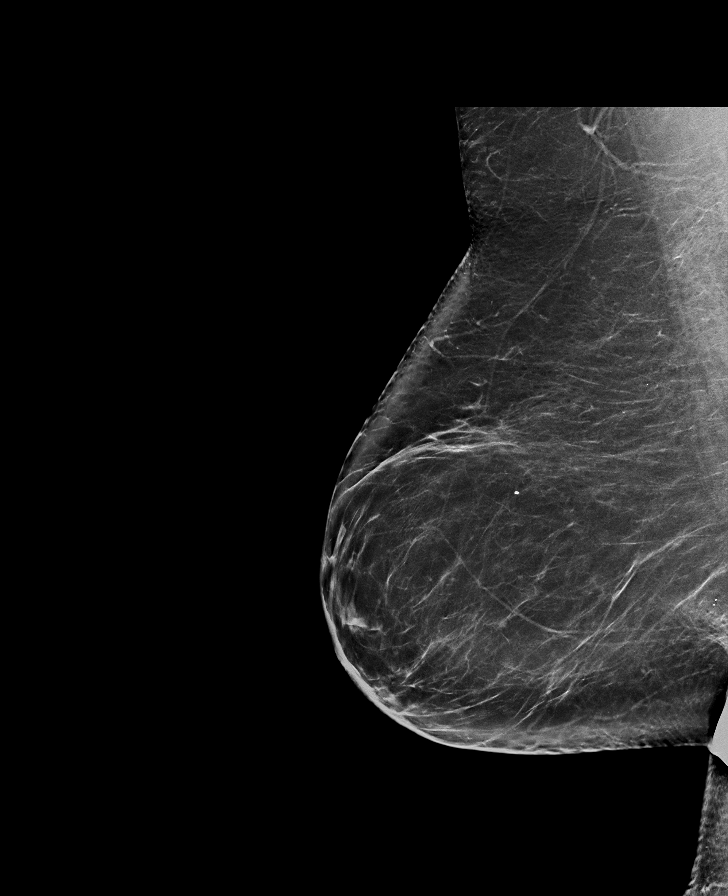

[R CC]
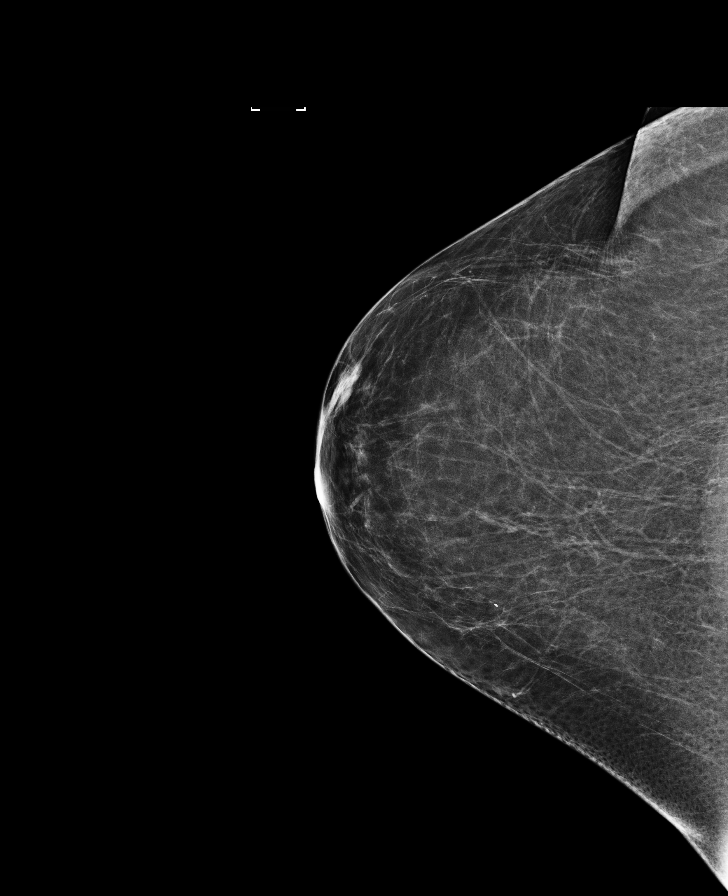

[L MLO (2 of 2)]
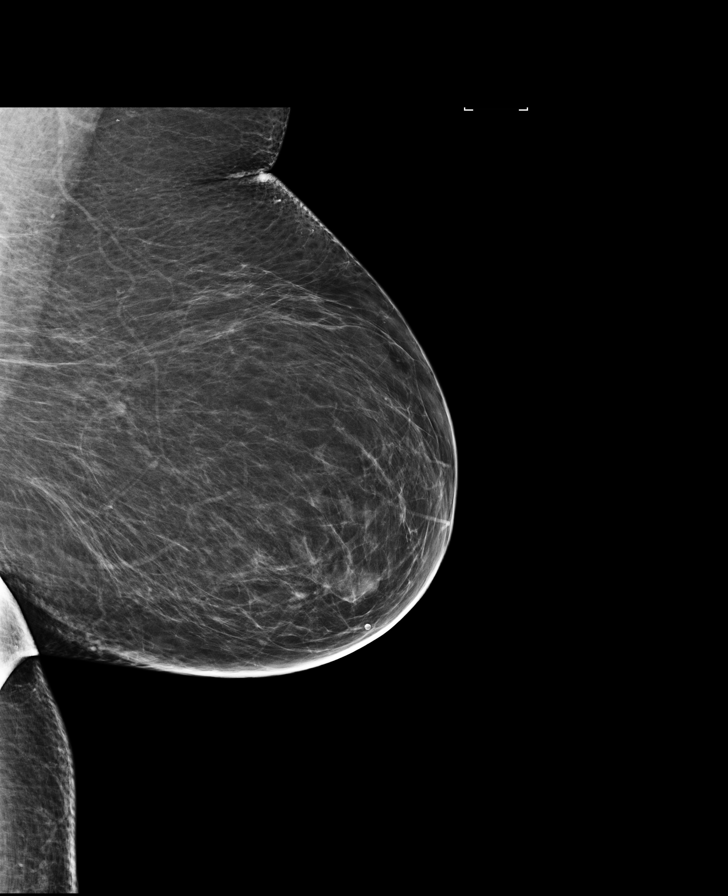

[L CC synth-2D]
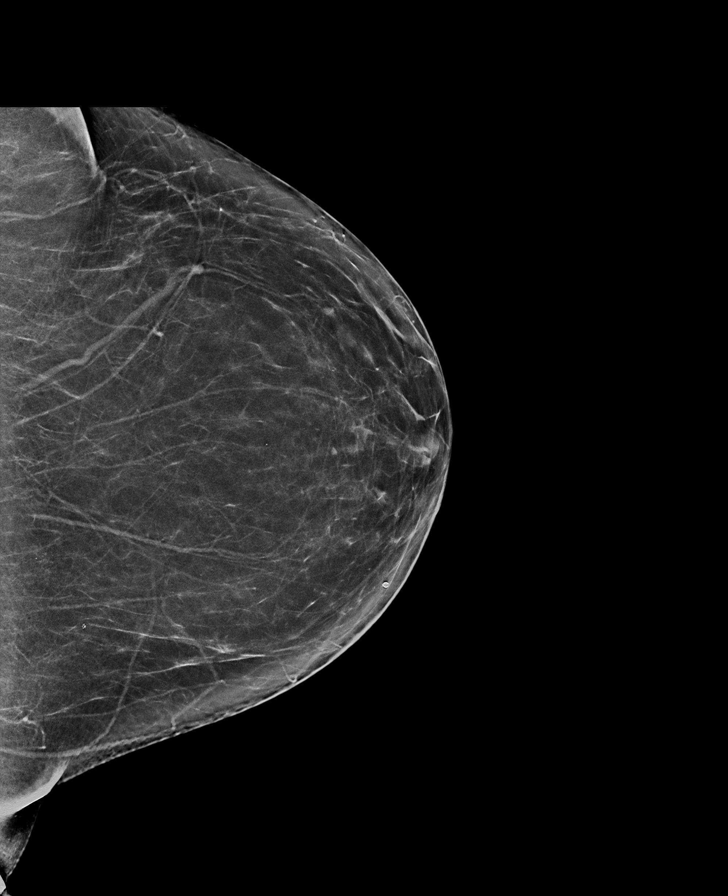

[R MLO]
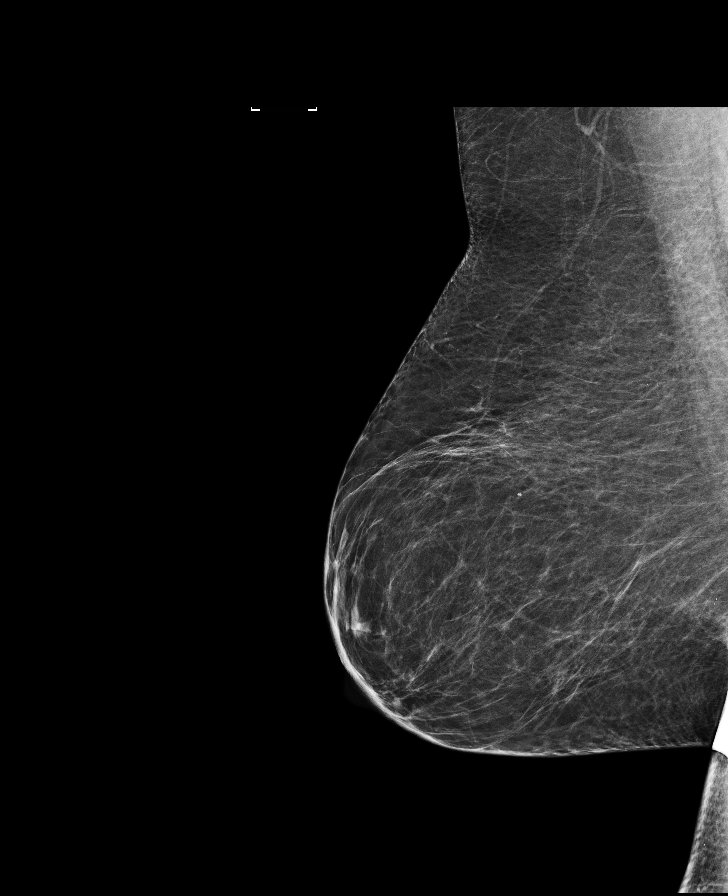

[L CC]
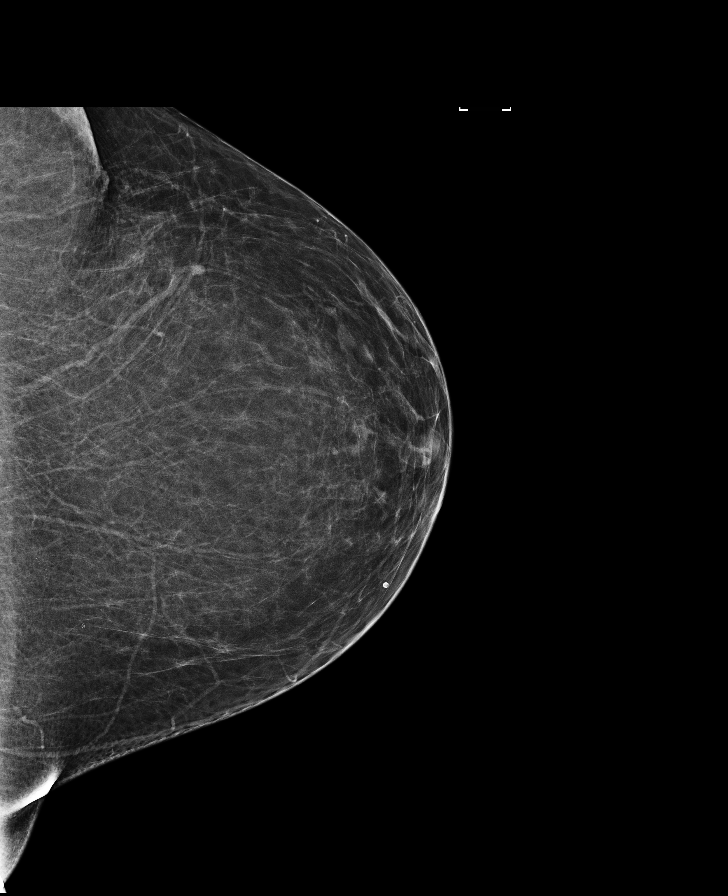

[L MLO synth-2D]
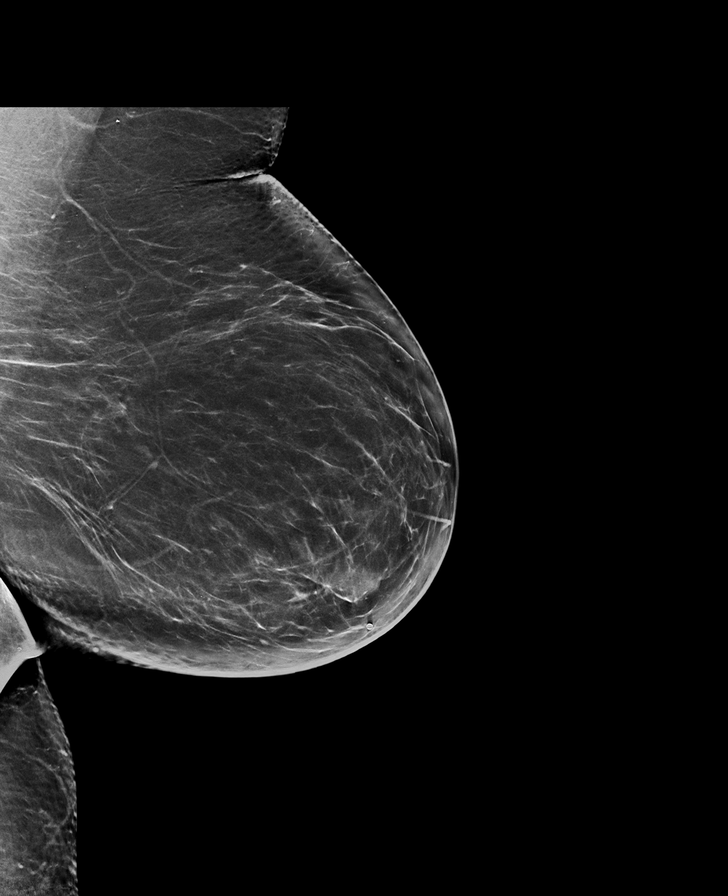

[8 of 29 positions shown; findings below may reference images not displayed]

ACR Breast Density Category b: There are scattered areas of
fibroglandular density.
FINDINGS: Mammographically, there are no suspicious masses, areas of
architectural distortion or microcalcifications in either breast.
There is a less prominent low-density circumscribed 2-3 mm nodule in
the lower slightly outer right breast, middle depth. The finding is
less prominent than on the prior mammograms.

Mammographic images were processed with CAD.
IMPRESSION: Less prominent probably benign tiny nodule in the right breast.
Given size stability for 2 years, this finding is consistent
considered benign.

RECOMMENDATION:
Screening mammogram in one year.(Code:MI-S-41I)

I have discussed the findings and recommendations with the patient.
Results were also provided in writing at the conclusion of the
visit. If applicable, a reminder letter will be sent to the patient
regarding the next appointment.

BI-RADS CATEGORY  2: Benign.

## 2019-01-10 ENCOUNTER — Encounter: Payer: Self-pay | Admitting: Internal Medicine

## 2019-01-11 ENCOUNTER — Other Ambulatory Visit: Payer: Self-pay | Admitting: Internal Medicine

## 2019-01-11 MED ORDER — DILTIAZEM HCL ER 180 MG PO CP24: 180.0000 mg | ORAL_CAPSULE | Freq: Every day | ORAL | 0 refills | Status: DC

## 2019-03-05 LAB — HEPATIC FUNCTION PANEL
ALT: 24 (ref 7–35)
AST: 25 (ref 13–35)
Alkaline Phosphatase: 81 (ref 25–125)
Bilirubin, Total: 0.4

## 2019-03-05 LAB — COMPREHENSIVE METABOLIC PANEL
Albumin: 4 (ref 3.5–5.0)
Calcium: 8.9 (ref 8.7–10.7)
GFR calc Af Amer: 97
GFR calc non Af Amer: 84
Globulin: 2.8

## 2019-03-05 LAB — LIPID PANEL
Cholesterol: 142 (ref 0–200)
HDL: 45 (ref 35–70)
LDL Cholesterol: 74
Triglycerides: 146 (ref 40–160)

## 2019-03-05 LAB — CBC AND DIFFERENTIAL
HCT: 30 — AB (ref 36–46)
Hemoglobin: 9.3 — AB (ref 12.0–16.0)
Neutrophils Absolute: 2433
Platelets: 381 (ref 150–399)
WBC: 4.6

## 2019-03-05 LAB — BASIC METABOLIC PANEL
BUN: 19 (ref 4–21)
CO2: 27 — AB (ref 13–22)
Chloride: 102 (ref 99–108)
Creatinine: 0.8 (ref 0.5–1.1)
Glucose: 88
Potassium: 4.3 (ref 3.4–5.3)
Sodium: 138 (ref 137–147)

## 2019-03-05 LAB — VITAMIN D 25 HYDROXY (VIT D DEFICIENCY, FRACTURES): Vit D, 25-Hydroxy: 46

## 2019-03-05 LAB — TSH: TSH: 2.86 (ref 0.41–5.90)

## 2019-03-05 LAB — CBC: RBC: 3.88 (ref 3.87–5.11)

## 2019-03-14 ENCOUNTER — Encounter: Payer: Self-pay | Admitting: Internal Medicine

## 2019-03-17 NOTE — Progress Notes (Signed)
Abstracted labs from 03/05/2019 from Dr Rema Fendt [place] [address].

## 2019-03-19 ENCOUNTER — Ambulatory Visit (INDEPENDENT_AMBULATORY_CARE_PROVIDER_SITE_OTHER): Payer: Self-pay | Admitting: Internal Medicine

## 2019-03-19 ENCOUNTER — Other Ambulatory Visit: Payer: Self-pay

## 2019-03-19 ENCOUNTER — Encounter: Payer: Self-pay | Admitting: Internal Medicine

## 2019-03-19 VITALS — BP 128/80 | HR 66 | Ht 65.0 in | Wt 234.0 lb

## 2019-03-19 DIAGNOSIS — Z1211 Encounter for screening for malignant neoplasm of colon: Secondary | ICD-10-CM

## 2019-03-19 DIAGNOSIS — Z1231 Encounter for screening mammogram for malignant neoplasm of breast: Secondary | ICD-10-CM

## 2019-03-19 DIAGNOSIS — E559 Vitamin D deficiency, unspecified: Secondary | ICD-10-CM

## 2019-03-19 DIAGNOSIS — Z0000 Encounter for general adult medical examination without abnormal findings: Secondary | ICD-10-CM

## 2019-03-19 DIAGNOSIS — E782 Mixed hyperlipidemia: Secondary | ICD-10-CM

## 2019-03-19 DIAGNOSIS — I10 Essential (primary) hypertension: Secondary | ICD-10-CM

## 2019-03-19 DIAGNOSIS — I480 Paroxysmal atrial fibrillation: Secondary | ICD-10-CM

## 2019-03-19 NOTE — Patient Instructions (Signed)
Call [facility] to schedule your mammogram  Send in the stool test as soon as you can if you decide to have it done

## 2019-03-19 NOTE — Progress Notes (Signed)
Date:  03/19/2019   Name:  Judith Wood   DOB:  [birth year]   MRN:  [id number]   Chief Complaint: Annual Exam (Breast Exam. ) Judith Wood is a 64 y.o. female who presents today for her Complete Annual Exam. She feels well. She reports exercising walking the dog several days per week. She reports she is sleeping well. She denies breast issues.  She is still trying to follow the [facility] program.  Mammogram  03/2017 Pap  03/2017 - discontinued Colonoscopy - none  There is no immunization history on file for this patient.  Hypertension This is a chronic problem. The problem is controlled. Pertinent negatives include no chest pain, headaches, palpitations or shortness of breath. There are no compliance problems.   Atrial fibrillation - paroxysmal with about 5 episodes in the past year.  She takes Flecainide PRN.  Most episodes last about one hour. Anemia - seen on recent outside labs.  Pt states that she had donated blood shortly before this and Hgb was normal.   She feels well without dizziness or obvious bleeding issues.  Lab Results  Component Value Date   CREATININE 0.8 03/05/2019   BUN 19 03/05/2019   NA 138 03/05/2019   K 4.3 03/05/2019   CL 102 03/05/2019   CO2 27 (A) 03/05/2019   Lab Results  Component Value Date   CHOL 142 03/05/2019   HDL 45 03/05/2019   LDLCALC 74 03/05/2019   TRIG 146 03/05/2019   Lab Results  Component Value Date   TSH 2.86 03/05/2019   Lab Results  Component Value Date   WBC 4.6 03/05/2019   HGB 9.3 (A) 03/05/2019   HCT 30 (A) 03/05/2019   PLT 381 03/05/2019     Review of Systems  Constitutional: Negative for chills, fatigue and fever.       Occasional episodes of low BS ~50 if she eats concentrated sweets  HENT: Negative for congestion, hearing loss, tinnitus, trouble swallowing and voice change.   Eyes: Negative for visual disturbance.  Respiratory: Negative for cough, chest tightness, shortness of breath and wheezing.    Cardiovascular: Negative for chest pain, palpitations and leg swelling.  Gastrointestinal: Negative for abdominal pain, constipation, diarrhea and vomiting.  Endocrine: Negative for polydipsia and polyuria.  Genitourinary: Positive for urgency. Negative for dysuria, frequency, genital sores, hematuria, vaginal bleeding and vaginal discharge.  Musculoskeletal: Negative for arthralgias, gait problem and joint swelling.  Skin: Negative for color change and rash.  Neurological: Negative for dizziness, tremors, light-headedness and headaches.  Hematological: Negative for adenopathy. Does not bruise/bleed easily.  Psychiatric/Behavioral: Negative for dysphoric mood and sleep disturbance. The patient is not nervous/anxious.     Patient Active Problem List   Diagnosis Date Noted  . Benign essential HTN 10/01/2017  . Seborrheic keratoses, inflamed 08/31/2016  . Paroxysmal atrial fibrillation ([place]) 01/18/2016  . Hyperlipidemia 01/18/2016  . 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 Vitamin D deficiency 01/18/2016  . Abnormal mammogram of right breast 01/18/2016    Allergies  Allergen Reactions  . Norco [Hydrocodone-Acetaminophen]   . Tetracyclines & Related     Past Surgical History:  Procedure Laterality Date  . ABDOMINAL HYSTERECTOMY  2000   ovarian cyst; cervix remains  . CHOLECYSTECTOMY    . ROUX-EN-Y GASTRIC BYPASS  2009  . WRIST SURGERY      Social History   Tobacco Use  . Smoking status: Never Smoker  . Smokeless tobacco: Never Used  Substance Use Topics  .  Alcohol use: No  . Drug use: No     Medication list has been reviewed and updated.  Current Meds  Medication Sig  . aspirin 81 MG chewable tablet Chew by mouth daily.  Marland Kitchen b complex vitamins tablet Take 1 tablet by mouth daily.  . Calcium Citrate-Vitamin D (CALCIUM CITRATE + D PO) Take 4 tablets by mouth.  . cholecalciferol (VITAMIN D) 400 units TABS tablet Take 400 Units by mouth.  . diltiazem (DILT-XR)  180 MG 24 hr capsule Take 1 capsule (180 mg total) by mouth daily.  . ferrous sulfate 325 (65 FE) MG EC tablet Take 325 mg by mouth daily with breakfast.   . flecainide (TAMBOCOR) 150 MG tablet Take 150 mg by mouth 2 (two) times daily as needed.  Marland Kitchen glucosamine-chondroitin 500-400 MG tablet Take 1 tablet by mouth 3 (three) times daily.  . magnesium 30 MG tablet Take 30 mg by mouth 2 (two) times daily.    PHQ 2/9 Scores 03/19/2019 07/03/2018 05/31/2018 03/11/2018  PHQ - 2 Score 0 0 0 0  PHQ- 9 Score - 0 - -    BP Readings from Last 3 Encounters:  03/19/19 128/80  07/03/18 128/70  03/11/18 118/70    Physical Exam Vitals and nursing note reviewed.  Constitutional:      General: She is not in acute distress.    Appearance: She is well-developed.  HENT:     Head: Normocephalic and atraumatic.     Right Ear: Tympanic membrane and ear canal normal.     Left Ear: Tympanic membrane and ear canal normal.     Nose:     Right Sinus: No maxillary sinus tenderness.     Left Sinus: No maxillary sinus tenderness.  Eyes:     General: No scleral icterus.       Right eye: No discharge.        Left eye: No discharge.     Conjunctiva/sclera: Conjunctivae normal.  Neck:     Thyroid: No thyromegaly.     Vascular: No carotid bruit.  Cardiovascular:     Rate and Rhythm: Normal rate and regular rhythm.     Pulses: Normal pulses.     Heart sounds: Normal heart sounds.  Pulmonary:     Effort: Pulmonary effort is normal. No respiratory distress.     Breath sounds: No wheezing.  Chest:     Breasts:        Right: No mass, nipple discharge, skin change or tenderness.        Left: No mass, nipple discharge, skin change or tenderness.  Abdominal:     General: Bowel sounds are normal.     Palpations: Abdomen is soft.     Tenderness: There is no abdominal tenderness.  Musculoskeletal:        General: Normal range of motion.     Cervical back: Normal range of motion. No erythema.  Lymphadenopathy:      Cervical: No cervical adenopathy.  Skin:    General: Skin is warm and dry.     Findings: No rash.  Neurological:     Mental Status: She is alert and oriented to person, place, and time.     Cranial Nerves: No cranial nerve deficit.     Sensory: No sensory deficit.     Deep Tendon Reflexes: Reflexes are normal and symmetric.  Psychiatric:        Speech: Speech normal.        Behavior: Behavior normal.  Thought Content: Thought content normal.     Wt Readings from Last 3 Encounters:  03/19/19 234 lb (106.1 kg)  07/03/18 228 lb (103.4 kg)  05/31/18 231 lb (104.8 kg)    BP 128/80   Pulse 66   Ht 5\' 5"  (1.651 m)   Wt 234 lb (106.1 kg)   SpO2 98%   BMI 38.94 kg/m   Assessment and Plan: 1. Annual physical exam Normal exam except for weight Continue WW, try to exercise more regularly  2. Encounter for screening mammogram for breast cancer To be scheduled at [facility]; Future  3. Benign essential HTN Clinically stable exam with well controlled BP on diltiazem 180 XR daily.. Tolerating medications without side effects at this time. Pt to continue current regimen and low sodium diet; benefits of regular exercise as able discussed.  4. Paroxysmal atrial fibrillation (HCC) Rare episodes lasting only an hour and aborted with the use of PRN Flecainide. She will follow up with Cardiology if needed.  5. Mixed hyperlipidemia Not on statin or other prescription lipid medications She will continue dietary modifications  6. Vitamin D deficiency On daily supplement Levels not done on recent labs  7. Colon cancer screening Suspect recent low Hgb was due to phlebotomy. Since her Hct was sufficient for donation I do not feel that further testing is needed at this time She is due for CRC screening but has no coverage - will check on cost of FIT - Fecal occult blood, imunochemical   Partially dictated using Editor, commissioning. Any errors are  unintentional.  Halina Maidens, MD [facility]  03/19/2019

## 2019-03-31 ENCOUNTER — Other Ambulatory Visit: Payer: Self-pay | Admitting: *Deleted

## 2019-03-31 ENCOUNTER — Other Ambulatory Visit: Payer: Self-pay

## 2019-03-31 DIAGNOSIS — Z0000 Encounter for general adult medical examination without abnormal findings: Secondary | ICD-10-CM

## 2019-04-01 ENCOUNTER — Ambulatory Visit: Payer: Self-pay | Attending: Oncology

## 2019-04-01 ENCOUNTER — Ambulatory Visit
Admission: RE | Admit: 2019-04-01 | Discharge: 2019-04-01 | Disposition: A | Discharge status: Home / Self Care | Payer: Self-pay | Source: Ambulatory Visit | Attending: Oncology | Admitting: Oncology

## 2019-04-01 DIAGNOSIS — Z0000 Encounter for general adult medical examination without abnormal findings: Secondary | ICD-10-CM

## 2019-04-01 NOTE — Progress Notes (Signed)
Patient pre-screened for BCCCP eligibility due to COVID 19 precautions. Two patient identifiers used for verification .  Presented to [facility] today for BCCCP screening mammogram. Risk Assessment    No risk assessment data for the current encounter   Risk Scores      03/31/2019   Last edited by: Orson Slick, CMA   5-year risk: 1.7 %   Lifetime risk: 7.7 %

## 2019-04-04 LAB — FECAL OCCULT BLOOD, IMMUNOCHEMICAL: Fecal Occult Bld: NEGATIVE

## 2019-05-19 NOTE — Progress Notes (Signed)
Letter mailed from [facility] to notify of normal mammogram results.  Patient to return in one year for annual screening.  Copy to [facility]. 

## 2019-07-02 ENCOUNTER — Other Ambulatory Visit: Payer: Self-pay | Admitting: Internal Medicine

## 2019-07-02 MED ORDER — DILTIAZEM HCL ER 180 MG PO CP24
180.0000 mg | ORAL_CAPSULE | Freq: Every day | ORAL | 5 refills | Status: DC
Start: 2019-07-02 — End: 2020-04-05

## 2020-03-16 LAB — COMPREHENSIVE METABOLIC PANEL
Albumin: 4.2 (ref 3.5–5.0)
Calcium: 9.3 (ref 8.7–10.7)
GFR calc Af Amer: 81
GFR calc non Af Amer: 70
Globulin: 3.1

## 2020-03-16 LAB — IRON,TIBC AND FERRITIN PANEL: Iron: 36

## 2020-03-16 LAB — CBC AND DIFFERENTIAL
HCT: 34 — AB (ref 36–46)
Hemoglobin: 10.6 — AB (ref 12.0–16.0)
Platelets: 397 (ref 150–399)
WBC: 5

## 2020-03-16 LAB — HEPATIC FUNCTION PANEL
ALT: 20 (ref 7–35)
AST: 22 (ref 13–35)
Alkaline Phosphatase: 83 (ref 25–125)
Bilirubin, Total: 0.4

## 2020-03-16 LAB — BASIC METABOLIC PANEL
BUN: 20 (ref 4–21)
CO2: 31 — AB (ref 13–22)
Chloride: 101 (ref 99–108)
Creatinine: 0.9 (ref 0.5–1.1)
Glucose: 86
Potassium: 4.5 (ref 3.4–5.3)
Sodium: 138 (ref 137–147)

## 2020-03-16 LAB — LIPID PANEL
Cholesterol: 161 (ref 0–200)
HDL: 45 (ref 35–70)
LDL Cholesterol: 88
LDl/HDL Ratio: 3.6
Triglycerides: 188 — AB (ref 40–160)

## 2020-03-16 LAB — VITAMIN D 25 HYDROXY (VIT D DEFICIENCY, FRACTURES): Vit D, 25-Hydroxy: 32

## 2020-03-16 LAB — CBC: RBC: 4.41 (ref 3.87–5.11)

## 2020-03-22 ENCOUNTER — Encounter: Payer: Self-pay | Admitting: Internal Medicine

## 2020-04-04 NOTE — Progress Notes (Signed)
Date:  04/05/2020   Name:  Judith Wood   DOB:  1955/10/23   MRN:  [id number]   Chief Complaint: Annual Exam (Breast exam no pap) Judith Wood is a 65 y.o. female who presents today for her Complete Annual Exam. She feels well. She reports exercising walking x1 day a week. She reports she is sleeping well. Breast complaints none.  Mammogram: 04/01/2019 DEXA: none Pap smear: 03/2017 Colonoscopy: 2008; FIT neg 03/2019  Immunization History  Administered Date(s) Administered   PFIZER Comirnaty([place])Covid-19 Tri-Sucrose Vaccine 06/25/2019, 07/17/2019     Hypertension This is a chronic problem. The problem is controlled. Associated symptoms include palpitations (brief episodes of Afib once a month or so lasting 30 minutes). Pertinent negatives include no chest pain, headaches or shortness of breath. Past treatments include calcium channel blockers. Hypertensive end-organ damage includes CAD/MI (afib).  Shoulder Pain  The pain is present in the left shoulder. This is a chronic problem. The current episode started more than 1 year ago. There has been no history of extremity trauma. The problem occurs daily. The problem has been unchanged. The quality of the pain is described as aching. The pain is mild. Associated symptoms include a limited range of motion. Pertinent negatives include no fever. The symptoms are aggravated by activity. She has tried nothing for the symptoms.    Lab Results  Component Value Date   CREATININE 0.9 03/16/2020   BUN 20 03/16/2020   NA 138 03/16/2020   K 4.5 03/16/2020   CL 101 03/16/2020   CO2 31 (A) 03/16/2020   Lab Results  Component Value Date   CHOL 161 03/16/2020   HDL 45 03/16/2020   LDLCALC 88 03/16/2020   TRIG 188 (A) 03/16/2020   Lab Results  Component Value Date   TSH 2.86 03/05/2019   No results found for: HGBA1C Lab Results  Component Value Date   WBC 5.0 03/16/2020   HGB 10.6 (A) 03/16/2020   HCT 34 (A) 03/16/2020   PLT 397  03/16/2020   Lab Results  Component Value Date   ALT 20 03/16/2020   AST 22 03/16/2020   ALKPHOS 83 03/16/2020   Lab Results  Component Value Date   IRON 36 03/16/2020   Last vitamin D Lab Results  Component Value Date   VD25OH 32 03/16/2020     Review of Systems  Constitutional: Negative for chills, fatigue and fever.  HENT: Negative for congestion, hearing loss, tinnitus, trouble swallowing and voice change.   Eyes: Negative for visual disturbance.  Respiratory: Negative for cough, chest tightness, shortness of breath and wheezing.   Cardiovascular: Positive for palpitations (brief episodes of Afib once a month or so lasting 30 minutes). Negative for chest pain and leg swelling.  Gastrointestinal: Negative for abdominal pain, constipation, diarrhea and vomiting.  Endocrine: Negative for polydipsia and polyuria.  Genitourinary: Negative for dysuria, frequency, genital sores, vaginal bleeding and vaginal discharge.  Musculoskeletal: Positive for arthralgias (left shoulder). Negative for gait problem and joint swelling.  Skin: Negative for color change and rash.  Neurological: Negative for dizziness, tremors, light-headedness and headaches.  Hematological: Negative for adenopathy. Does not bruise/bleed easily.  Psychiatric/Behavioral: Negative for dysphoric mood and sleep disturbance. The patient is not nervous/anxious.     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   Vitamin D deficiency 01/18/2016   Abnormal mammogram of right breast 01/18/2016    Allergies  Allergen Reactions   Norco [Hydrocodone-Acetaminophen]    Tetracyclines & Related     Past Surgical History:  Procedure Laterality Date    ABDOMINAL HYSTERECTOMY  2000   ovarian cyst; cervix remains   CHOLECYSTECTOMY     ROUX-EN-Y GASTRIC BYPASS  2009   WRIST SURGERY      Social History   Tobacco Use   Smoking status: Never Smoker   Smokeless tobacco: Never Used  Vaping Use   Vaping Use: Never used  Substance Use Topics   Alcohol use: No   Drug use: No     Medication list has been reviewed and updated.  Current Meds  Medication Sig   aspirin 81 MG chewable tablet Chew by mouth daily.   b complex vitamins tablet Take 1 tablet by mouth daily.   BIOTIN PO Take 1 tablet by mouth daily.   Calcium Citrate-Vitamin D (CALCIUM CITRATE + D PO) Take 4 tablets by mouth.   cholecalciferol (VITAMIN D) 400 units TABS tablet Take 400 Units by mouth.   ferrous sulfate 325 (65 FE) MG EC tablet Take 325 mg by mouth daily with breakfast.    flecainide (TAMBOCOR) 150 MG tablet Take 150 mg by mouth 2 (two) times daily as needed.   glucosamine-chondroitin 500-400 MG tablet Take 1 tablet by mouth 3 (three) times daily.   magnesium 30 MG tablet Take 30 mg by mouth 2 (two) times daily.   Probiotic Product (PROBIOTIC PO) Take by mouth daily.   [DISCONTINUED] diltiazem (DILT-XR) 180 MG 24 hr capsule Take 1 capsule (180 mg total) by mouth daily.    PHQ 2/9 Scores 04/05/2020 03/19/2019 07/03/2018 05/31/2018  PHQ - 2 Score 0 0 0 0  PHQ- 9 Score 0 - 0 -    GAD 7 : Generalized Anxiety Score 04/05/2020  Nervous, Anxious, on Edge 0  Control/stop worrying 0  Worry too much - different things 0  Trouble relaxing 0  Restless 0  Easily annoyed or irritable 0  Afraid - awful might happen 0  Total GAD 7 Score 0    BP Readings from Last 3 Encounters:  04/05/20 134/78  03/19/19 128/80  07/03/18 128/70    Physical Exam Vitals and nursing note reviewed.  Constitutional:      General: She is not in acute distress.    Appearance: She is well-developed.  HENT:     Head: Normocephalic and atraumatic.     Right Ear:  Tympanic membrane and ear canal normal.     Left Ear: Tympanic membrane and ear canal normal.     Nose:     Right Sinus: No maxillary sinus tenderness.     Left Sinus: No maxillary sinus tenderness.  Eyes:     General: No scleral icterus.       Right eye: No discharge.        Left eye: No discharge.     Conjunctiva/sclera: Conjunctivae normal.  Neck:     Thyroid: No thyromegaly.     Vascular: No carotid bruit.  Cardiovascular:     Rate and Rhythm: Normal rate and regular rhythm.     Pulses: Normal pulses.     Heart sounds: Normal heart sounds.  Pulmonary:     Effort: Pulmonary effort is normal. No respiratory distress.     Breath sounds: No wheezing.  Chest:  Breasts:  Right: No mass, nipple discharge, skin change or tenderness.     Left: No mass, nipple discharge, skin change or tenderness.    Abdominal:     General: Bowel sounds are normal.     Palpations: Abdomen is soft.     Tenderness: There is no abdominal tenderness.  Musculoskeletal:     Right shoulder: Normal.     Left shoulder: Tenderness present. No swelling. Decreased range of motion. Normal strength. Normal pulse.     Cervical back: Normal range of motion. No erythema.     Right lower leg: No edema.     Left lower leg: No edema.  Lymphadenopathy:     Cervical: No cervical adenopathy.  Skin:    General: Skin is warm and dry.     Findings: No rash.  Neurological:     Mental Status: She is alert and oriented to person, place, and time.     Cranial Nerves: No cranial nerve deficit.     Sensory: No sensory deficit.     Deep Tendon Reflexes: Reflexes are normal and symmetric.  Psychiatric:        Attention and Perception: Attention normal.        Mood and Affect: Mood normal.     Wt Readings from Last 3 Encounters:  04/05/20 241 lb (109.3 kg)  03/19/19 234 lb (106.1 kg)  07/03/18 228 lb (103.4 kg)    BP 134/78    Pulse 68    Temp (!) 97.4 F (36.3 C) (Oral)    Ht 5\' 5"  (1.651 m)    Wt 241 lb (109.3  kg)    SpO2 98%    BMI 40.10 kg/m   Assessment and Plan: 1. Annual physical exam Continue [place] for weight loss More regular exercise encouraged  2. Encounter for screening mammogram for breast cancer Pt scheduled thru [facility]  3. Colon cancer screening - Fecal occult blood, imunochemical  4. Benign essential HTN Clinically stable exam with well controlled BP on cardiazem. Tolerating medications without side effects at this time. Pt to continue current regimen and low sodium diet; benefits of regular exercise as able discussed.  5. Mixed hyperlipidemia On no medication at this time recent lipids acceptable  6. Vitamin D deficiency Continue daily supplemenation  7. Paroxysmal atrial fibrillation (HCC) Followed by cardiology Using Flecainide PRN  8. Chronic left shoulder pain Possible mild rotator cuff injury Minimal pain with modest decrease in ROM Can try home exercises   Partially dictated using Editor, commissioning. Any errors are unintentional.  Halina Maidens, MD [facility]  04/05/2020

## 2020-04-05 ENCOUNTER — Ambulatory Visit (INDEPENDENT_AMBULATORY_CARE_PROVIDER_SITE_OTHER): Payer: Self-pay | Admitting: Internal Medicine

## 2020-04-05 ENCOUNTER — Other Ambulatory Visit: Payer: Self-pay

## 2020-04-05 ENCOUNTER — Encounter: Payer: Self-pay | Admitting: Internal Medicine

## 2020-04-05 VITALS — BP 134/78 | HR 68 | Temp 97.4°F | Ht 65.0 in | Wt 241.0 lb

## 2020-04-05 DIAGNOSIS — Z1211 Encounter for screening for malignant neoplasm of colon: Secondary | ICD-10-CM

## 2020-04-05 DIAGNOSIS — Z0000 Encounter for general adult medical examination without abnormal findings: Secondary | ICD-10-CM

## 2020-04-05 DIAGNOSIS — E559 Vitamin D deficiency, unspecified: Secondary | ICD-10-CM

## 2020-04-05 DIAGNOSIS — I10 Essential (primary) hypertension: Secondary | ICD-10-CM

## 2020-04-05 DIAGNOSIS — G8929 Other chronic pain: Secondary | ICD-10-CM

## 2020-04-05 DIAGNOSIS — M25512 Pain in left shoulder: Secondary | ICD-10-CM

## 2020-04-05 DIAGNOSIS — I480 Paroxysmal atrial fibrillation: Secondary | ICD-10-CM

## 2020-04-05 DIAGNOSIS — Z1231 Encounter for screening mammogram for malignant neoplasm of breast: Secondary | ICD-10-CM

## 2020-04-05 DIAGNOSIS — E782 Mixed hyperlipidemia: Secondary | ICD-10-CM

## 2020-04-05 MED ORDER — DILTIAZEM HCL ER 180 MG PO CP24: 180.0000 mg | ORAL_CAPSULE | Freq: Every day | ORAL | 5 refills | Status: DC

## 2020-04-29 ENCOUNTER — Telehealth: Payer: Self-pay

## 2020-04-29 NOTE — Telephone Encounter (Signed)
Called and left patient a VM reminding her that she needs to complete her Fecal Occult test. Told her to call us back with any questions or concerns.

## 2020-05-07 LAB — FECAL OCCULT BLOOD, IMMUNOCHEMICAL: Fecal Occult Bld: NEGATIVE

## 2020-06-07 ENCOUNTER — Other Ambulatory Visit: Payer: Self-pay

## 2020-06-07 ENCOUNTER — Encounter: Payer: Self-pay | Admitting: Cardiovascular Disease

## 2020-06-07 ENCOUNTER — Ambulatory Visit (INDEPENDENT_AMBULATORY_CARE_PROVIDER_SITE_OTHER): Payer: Self-pay | Admitting: Cardiovascular Disease

## 2020-06-07 VITALS — BP 128/78 | HR 66 | Ht 65.0 in | Wt 247.1 lb

## 2020-06-07 DIAGNOSIS — I10 Essential (primary) hypertension: Secondary | ICD-10-CM

## 2020-06-07 DIAGNOSIS — I480 Paroxysmal atrial fibrillation: Secondary | ICD-10-CM

## 2020-06-07 MED ORDER — PROPRANOLOL HCL 20 MG PO TABS: 20.0000 mg | ORAL_TABLET | Freq: Three times a day (TID) | ORAL | 1 refills | Status: DC | PRN

## 2020-06-07 MED ORDER — FLECAINIDE ACETATE 150 MG PO TABS: 150.0000 mg | ORAL_TABLET | Freq: Two times a day (BID) | ORAL | 1 refills | Status: DC | PRN

## 2020-06-07 NOTE — Patient Instructions (Addendum)
Medication Instructions:  propranolol 20 mg TID PRN   If you need a refill on your cardiac medications before your next appointment, please call your pharmacy.    Lab work: No new labs needed   If you have labs (blood work) drawn today and your tests are completely normal, you will receive your results only by: Marland Kitchen MyChart Message (if you have MyChart) OR . A paper copy in the mail If you have any lab test that is abnormal or we need to change your treatment, we will call you to review the results.   Testing/Procedures: No new testing needed   Follow-Up: At [facility], you and your health needs are our priority.  As part of our continuing mission to provide you with exceptional heart care, we have created designated Provider Care Teams.  These Care Teams include your primary Cardiologist (physician) and Advanced Practice Providers (APPs -  Physician Assistants and Nurse Practitioners) who all work together to provide you with the care you need, when you need it.  . You will need a follow up appointment in 12 months  . Providers on your designated Care Team:   . Murray Hodgkins, NP . Christell Faith, PA-C . Marrianne Mood, PA-C  Any Other Special Instructions Will Be Listed Below (If Applicable).  COVID-19 Vaccine Information can be found at: [email or web address].uk For questions related to vaccine distribution or appointments, please email [email or web address] .[email or web address] or call [phone].

## 2020-06-07 NOTE — Progress Notes (Signed)
Cardiology Office Note  Date:  06/07/2020   ID:  Judith Wood, DOB Nov 15, 1955, MRN [id number]  PCP:  Glean Hess, MD   Chief Complaint  Patient presents with  . Self Ref for a Hx. of A-fib     "doing well." Medications reviewed by the patient verbally.     HPI:  Judith Wood is a 65 year old woman with past medical history of Diagnosed with paroxysmal atrial fibrillation by outside cardiologist [place] Hyperlipidemia Hypertension History of smoking Who presents from home Judith Wood for consultation of her atrial fibrillation  Last seen by outside cardiology April 2021 Prior to that was seen July 2019  Outside cardiology notes do not detail her cardiac history very well Including any atrial fibrillation spells of  Echocardiogram August 2019 NORMAL LEFT VENTRICULAR SYSTOLIC FUNCTION  WITH MILD LVH  NORMAL RIGHT VENTRICULAR SYSTOLIC FUNCTION  MILD VALVULAR REGURGITATION (See above)  NO VALVULAR STENOSIS  TRIVIAL MR  MILD TR  EF 50%   Outside stress test August 2019, treadmill only  Walks during the week, not regular 2-3 miles a day  EKG personally reviewed by myself on todays visit NSR rate 66 bpm, no ST or T wave changes   PMH:   has a past medical history of Afib ([place]) and Hypertension.  PSH:    Past Surgical History:  Procedure Laterality Date  . ABDOMINAL HYSTERECTOMY  2000   ovarian cyst; cervix remains  . CHOLECYSTECTOMY    . ROUX-EN-Y GASTRIC BYPASS  2009  . WRIST SURGERY      Current Outpatient Medications  Medication Sig Dispense Refill  . aspirin 81 MG chewable tablet Chew by mouth daily.    Marland Kitchen b complex vitamins tablet Take 1 tablet by mouth daily.    Marland Kitchen BIOTIN PO Take 1 tablet by mouth daily.    . Calcium Citrate-Vitamin D (CALCIUM CITRATE + D PO) Take 4 tablets by mouth.    . cholecalciferol (VITAMIN D) 400 units TABS tablet Take 400 Units by mouth.    . diltiazem (DILT-XR) 180 MG 24 hr capsule Take 1 capsule (180 mg total) by  mouth daily. 30 capsule 5  . ferrous sulfate 325 (65 FE) MG EC tablet Take 325 mg by mouth daily with breakfast.     . flecainide (TAMBOCOR) 150 MG tablet Take 150 mg by mouth 2 (two) times daily as needed.    Marland Kitchen glucosamine-chondroitin 500-400 MG tablet Take 1 tablet by mouth 3 (three) times daily.    . magnesium 30 MG tablet Take 30 mg by mouth 2 (two) times daily.    . Probiotic Product (PROBIOTIC PO) Take by mouth daily.     No current facility-administered medications for this visit.    Allergies:   Norco [hydrocodone-acetaminophen] and Tetracyclines & related   Social History:  The patient  reports that she has never smoked. She has never used smokeless tobacco. She reports that she does not drink alcohol and does not use drugs.   Family History:   family history includes Arrhythmia in her brother, brother, mother, and sister; Heart attack (age of onset: 79) in her father; Hypertension in her mother.    Review of Systems: Review of Systems  Constitutional: Negative.   HENT: Negative.   Respiratory: Negative.   Cardiovascular: Negative.   Gastrointestinal: Negative.   Musculoskeletal: Negative.   Neurological: Negative.   Psychiatric/Behavioral: Negative.   All other systems reviewed and are negative.   PHYSICAL EXAM: VS:  BP 128/78 (BP Location: Right  Arm, Patient Position: Sitting, Cuff Size: Large)   Pulse 66   Ht 5\' 5"  (1.651 m)   Wt 247 lb 2 oz (112.1 kg)   SpO2 99%   BMI 41.12 kg/m  , BMI Body mass index is 41.12 kg/m. GEN: Well nourished, well developed, in no acute distress HEENT: normal Neck: no JVD, carotid bruits, or masses Cardiac: RRR; no murmurs, rubs, or gallops,no edema  Respiratory:  clear to auscultation bilaterally, normal work of breathing GI: soft, nontender, nondistended, + BS MS: no deformity or atrophy Skin: warm and dry, no rash Neuro:  Strength and sensation are intact Psych: euthymic mood, full affect  Recent Labs: 03/16/2020: ALT 20;  BUN 20; Creatinine 0.9; Hemoglobin 10.6; Platelets 397; Potassium 4.5; Sodium 138    Lipid Panel Lab Results  Component Value Date   CHOL 161 03/16/2020   HDL 45 03/16/2020   LDLCALC 88 03/16/2020   TRIG 188 (A) 03/16/2020      Wt Readings from Last 3 Encounters:  06/07/20 247 lb 2 oz (112.1 kg)  04/05/20 241 lb (109.3 kg)  03/19/19 234 lb (106.1 kg)      ASSESSMENT AND PLAN:  Problem List Items Addressed This Visit      Cardiology Problems   Benign essential HTN   Paroxysmal atrial fibrillation (HCC) - Primary     Atrial fibrillation, paroxysmal 1-2 episodes  Per month No increase in frequency Not on NOAC, On flecainide PRN Will add propranolol PRN Discussed CHADS VASC 2, she prefers to hold off on as she has no insurance  HTN: Blood pressure is well controlled on today's visit. No changes made to the medications.   Disposition:   F/U  12 months   Total encounter time more than 45 minutes  Greater than 50% was spent in counseling and coordination of care with the patient    Signed, Esmond Plants, M.D., Ph.D. [place], [place]

## 2020-06-11 ENCOUNTER — Telehealth: Payer: Self-pay | Admitting: Cardiovascular Disease

## 2020-06-11 MED ORDER — PROPRANOLOL HCL 20 MG PO TABS: 20.0000 mg | ORAL_TABLET | Freq: Three times a day (TID) | ORAL | 1 refills | Status: DC | PRN

## 2020-06-11 MED ORDER — FLECAINIDE ACETATE 150 MG PO TABS: 150.0000 mg | ORAL_TABLET | Freq: Two times a day (BID) | ORAL | 1 refills | Status: DC | PRN

## 2020-06-11 NOTE — Telephone Encounter (Signed)
Requested Prescriptions   Signed Prescriptions Disp Refills   propranolol (INDERAL) 20 MG tablet 90 tablet 1    Sig: Take 1 tablet (20 mg total) by mouth 3 (three) times daily as needed.    Authorizing Provider: Minna Merritts    Ordering User: Raelene Bott, Alisa Stjames L   flecainide (TAMBOCOR) 150 MG tablet 30 tablet 1    Sig: Take 1 tablet (150 mg total) by mouth 2 (two) times daily as needed.    Authorizing Provider: Minna Merritts    Ordering User: Raelene Bott, Ustin Cruickshank L   Refills resent to pharmacy-originally sent on 'No Print' so they did not get transmitted electronically on 06/07/2020.

## 2020-06-11 NOTE — Telephone Encounter (Signed)
Prescriptions sent to [facility] on 06/07/2020

## 2020-06-11 NOTE — Telephone Encounter (Signed)
*  STAT* If patient is at the pharmacy, call can be transferred to refill team.   1. Which medications need to be refilled? (please list name of each medication and dose if known) propanolol and flecainide   2. Which pharmacy/location (including street and city if local pharmacy) is medication to be sent to? [facility] [address]  3. Do they need a 30 day or 90 day supply? [place]

## 2020-06-12 ENCOUNTER — Other Ambulatory Visit: Payer: Self-pay | Admitting: Internal Medicine

## 2020-06-12 DIAGNOSIS — I10 Essential (primary) hypertension: Secondary | ICD-10-CM

## 2020-06-12 NOTE — Telephone Encounter (Signed)
Requested Prescriptions  Pending Prescriptions Disp Refills  . DILT-XR 180 MG 24 hr capsule [Pharmacy Med Name: Dilt-XR 180 MG Oral Capsule Extended Release 24 Hour] 90 capsule 1    Sig: Take 1 capsule by mouth once daily     Cardiovascular:  Calcium Channel Blockers Passed - 06/12/2020  5:30 AM      Passed - Last BP in normal range    BP Readings from Last 1 Encounters:  06/07/20 128/78         Passed - Valid encounter within last 6 months    Recent Outpatient Visits          2 months ago Annual physical exam   [facility] Glean Hess, MD   1 year ago Annual physical exam   [facility] Glean Hess, MD   1 year ago Recurrent UTI   [facility] Glean Hess, MD   2 years ago Acute cystitis with hematuria   [facility] Glean Hess, MD   2 years ago Annual physical exam   [facility] Glean Hess, MD      Future Appointments            In 9 months Army Melia Jesse Sans, MD [facility]

## 2020-07-19 ENCOUNTER — Ambulatory Visit (INDEPENDENT_AMBULATORY_CARE_PROVIDER_SITE_OTHER): Payer: Self-pay

## 2020-07-19 ENCOUNTER — Encounter: Payer: Self-pay | Admitting: Podiatry

## 2020-07-19 ENCOUNTER — Ambulatory Visit (INDEPENDENT_AMBULATORY_CARE_PROVIDER_SITE_OTHER): Payer: Self-pay | Admitting: Podiatry

## 2020-07-19 ENCOUNTER — Other Ambulatory Visit: Payer: Self-pay

## 2020-07-19 DIAGNOSIS — M722 Plantar fascial fibromatosis: Secondary | ICD-10-CM

## 2020-07-19 NOTE — Progress Notes (Signed)
  Subjective:  Patient ID: Judith Wood, female    DOB: 22-Sep-1955,  MRN: [id number]  Chief Complaint  Patient presents with  . Foot Pain    Patient presents today for painful left heel x 3 weeks.  She states "its very sharp stabbing pain anytime I walk."    65 y.o. female presents with the above complaint. History confirmed with patient.  She has many years ago when she lived in [place] as well.  She has been doing stretching exercises and using a night splint.  Unable to take regular NSAIDs due to history of Roux-en-Y bypass  Objective:  Physical Exam: warm, good capillary refill, no trophic changes or ulcerative lesions, normal DP and PT pulses and normal sensory exam. 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 Plantar fasciitis of left foot      Plan:  Patient was evaluated and treated and all questions answered.  Discussed the etiology and treatment options for plantar fasciitis including stretching, formal physical therapy, supportive shoegears such as a running shoe or sneaker, pre fabricated orthoses, injection therapy, and oral medications. We also discussed the role of surgical treatment of this for patients who do not improve after exhausting non-surgical treatment options.   -XR reviewed with patient -Educated patient on stretching and icing of the affected limb -Injection delivered to the plantar fascia of the left foot.    Return if symptoms worsen or fail to improve.

## 2020-07-19 NOTE — Patient Instructions (Signed)

## 2020-08-10 NOTE — Telephone Encounter (Signed)
Higher atrial fibrillation burden seems to be a change from prior clinic visit Ideally should be on anticoagulation such as Eliquis or Xarelto to prevent stroke Consider a zio monitor to determine atrial fibrillation burden If having paroxysmal atrial fibrillation (which we are unable to determine apart from her symptoms), Could try metoprolol succinate 25 mg daily with flecainide 50 twice a day Consider follow-up in clinic as there are several things to discuss

## 2020-08-12 ENCOUNTER — Ambulatory Visit: Payer: Self-pay | Admitting: Internal Medicine

## 2020-08-12 ENCOUNTER — Emergency Department: Payer: Self-pay

## 2020-08-12 ENCOUNTER — Encounter: Payer: Self-pay | Admitting: Emergency Medicine

## 2020-08-12 ENCOUNTER — Other Ambulatory Visit: Payer: Self-pay

## 2020-08-12 ENCOUNTER — Emergency Department
Admission: EM | Admit: 2020-08-12 | Discharge: 2020-08-12 | Disposition: A | Discharge status: Home / Self Care | Payer: Self-pay | Attending: Emergency Medicine | Admitting: Emergency Medicine

## 2020-08-12 ENCOUNTER — Ambulatory Visit: Payer: Self-pay | Admitting: *Deleted

## 2020-08-12 DIAGNOSIS — Z7982 Long term (current) use of aspirin: Secondary | ICD-10-CM | POA: Insufficient documentation

## 2020-08-12 DIAGNOSIS — J40 Bronchitis, not specified as acute or chronic: Secondary | ICD-10-CM | POA: Insufficient documentation

## 2020-08-12 DIAGNOSIS — I4891 Unspecified atrial fibrillation: Secondary | ICD-10-CM | POA: Insufficient documentation

## 2020-08-12 DIAGNOSIS — Z8616 Personal history of COVID-19: Secondary | ICD-10-CM | POA: Insufficient documentation

## 2020-08-12 DIAGNOSIS — I10 Essential (primary) hypertension: Secondary | ICD-10-CM | POA: Insufficient documentation

## 2020-08-12 DIAGNOSIS — R0602 Shortness of breath: Secondary | ICD-10-CM

## 2020-08-12 DIAGNOSIS — Z79899 Other long term (current) drug therapy: Secondary | ICD-10-CM | POA: Insufficient documentation

## 2020-08-12 LAB — CBC WITH DIFFERENTIAL/PLATELET
Abs Immature Granulocytes: 0.02 10*3/uL (ref 0.00–0.07)
Basophils Absolute: 0 10*3/uL (ref 0.0–0.1)
Basophils Relative: 0 %
Eosinophils Absolute: 0 10*3/uL (ref 0.0–0.5)
Eosinophils Relative: 0 %
HCT: 32.3 % — ABNORMAL LOW (ref 36.0–46.0)
Hemoglobin: 9.9 g/dL — ABNORMAL LOW (ref 12.0–15.0)
Immature Granulocytes: 0 %
Lymphocytes Relative: 22 %
Lymphs Abs: 1.4 10*3/uL (ref 0.7–4.0)
MCH: 22.8 pg — ABNORMAL LOW (ref 26.0–34.0)
MCHC: 30.7 g/dL (ref 30.0–36.0)
MCV: 74.3 fL — ABNORMAL LOW (ref 80.0–100.0)
Monocytes Absolute: 0.6 10*3/uL (ref 0.1–1.0)
Monocytes Relative: 10 %
Neutro Abs: 4.3 10*3/uL (ref 1.7–7.7)
Neutrophils Relative %: 68 %
Platelets: 439 10*3/uL — ABNORMAL HIGH (ref 150–400)
RBC: 4.35 MIL/uL (ref 3.87–5.11)
RDW: 19.1 % — ABNORMAL HIGH (ref 11.5–15.5)
WBC: 6.3 10*3/uL (ref 4.0–10.5)
nRBC: 0 % (ref 0.0–0.2)

## 2020-08-12 LAB — COMPREHENSIVE METABOLIC PANEL
ALT: 22 U/L (ref 0–44)
AST: 23 U/L (ref 15–41)
Albumin: 3.6 g/dL (ref 3.5–5.0)
Alkaline Phosphatase: 68 U/L (ref 38–126)
Anion gap: 7 (ref 5–15)
BUN: 19 mg/dL (ref 8–23)
CO2: 28 mmol/L (ref 22–32)
Calcium: 8.4 mg/dL — ABNORMAL LOW (ref 8.9–10.3)
Chloride: 97 mmol/L — ABNORMAL LOW (ref 98–111)
Creatinine, Ser: 0.71 mg/dL (ref 0.44–1.00)
GFR, Estimated: >60 mL/min (ref >60)
Glucose, Bld: 97 mg/dL (ref 70–99)
Potassium: 4.6 mmol/L (ref 3.5–5.1)
Sodium: 132 mmol/L — ABNORMAL LOW (ref 135–145)
Total Bilirubin: 0.7 mg/dL (ref 0.3–1.2)
Total Protein: 6.9 g/dL (ref 6.5–8.1)

## 2020-08-12 LAB — D-DIMER, QUANTITATIVE: D-Dimer, Quant: <0.27 ug/mL-FEU (ref 0.00–0.50)

## 2020-08-12 LAB — TROPONIN I (HIGH SENSITIVITY)
Troponin I (High Sensitivity): 3 ng/L (ref <18)
Troponin I (High Sensitivity): 3 ng/L (ref <18)

## 2020-08-12 LAB — BRAIN NATRIURETIC PEPTIDE: B Natriuretic Peptide: 165.9 pg/mL — ABNORMAL HIGH (ref 0.0–100.0)

## 2020-08-12 MED ORDER — AMOXICILLIN-POT CLAVULANATE 875-125 MG PO TABS: 1.0000 | ORAL_TABLET | Freq: Two times a day (BID) | ORAL | 0 refills | Status: AC

## 2020-08-12 NOTE — Telephone Encounter (Signed)
Reason for Disposition  [1] MODERATE difficulty breathing (e.g., speaks in phrases, SOB even at rest, pulse 100-120) AND [2] NEW-onset or WORSE than normal    Had Covid 3 wks ago.   Shortness of breath at rest this morning which is new for her.   Also A. Fib.  Answer Assessment - Initial Assessment Questions 1. RESPIRATORY STATUS: "Describe your breathing?" (e.g., wheezing, shortness of breath, unable to speak, severe coughing)     I had Covid 3 wks ago.   I test negative now.   I'm still hoarse in my chest.   I'm short of breath and wheezing.    I had an appt with Dr. Army Melia but I cancelled it that was for this morning.   I woke up this morning wheezing. 2. ONSET: "When did this breathing problem begin?"     I'm short  of breath for 10 days.   I thought it would get better.   I'm wheezing this morning. 3. PATTERN "Does the difficult breathing come and go, or has it been constant since it started?"      If I'm still the shortness is not bad until this morning and I'm short of breath at rest this morning. 4. SEVERITY: "How bad is your breathing?" (e.g., mild, moderate, severe)    - MILD: No SOB at rest, mild SOB with walking, speaks normally in sentences, can lie down, no retractions, pulse < 100.    - MODERATE: SOB at rest, SOB with minimal exertion and prefers to sit, cannot lie down flat, speaks in phrases, mild retractions, audible wheezing, pulse 100-120.    - SEVERE: Very SOB at rest, speaks in single words, struggling to breathe, sitting hunched forward, retractions, pulse > 120      Moderate 5. RECURRENT SYMPTOM: "Have you had difficulty breathing before?" If Yes, ask: "When was the last time?" and "What happened that time?"      No  Had COVID 6. CARDIAC HISTORY: "Do you have any history of heart disease?" (e.g., heart attack, angina, bypass surgery, angioplasty)      I get A. Fib once in a while.   At 2:30 AM I woke up with A. Fib.   I took my Flecinide and propanolol.    I don't feel  chest pain but my heart is working hard.     My sister died at the end of 11-May-2022. With these same symptoms after having COVID.   Shortness of breath was her problem too. 7. LUNG HISTORY: "Do you have any history of lung disease?"  (e.g., pulmonary embolus, asthma, emphysema)     No lung history  8. CAUSE: "What do you think is causing the breathing problem?"      Left over from having Covid 3 weeks ago and I think I'm in A. Fib even after taking my medicine at 2:30 AM for A. Fib. 9. OTHER SYMPTOMS: "Do you have any other symptoms? (e.g., dizziness, runny nose, cough, chest pain, fever)     Weak, short of breath at rest this morning which is new. 10. O2 SATURATION MONITOR:  "Do you use an oxygen saturation monitor (pulse oximeter) at home?" If Yes, "What is your reading (oxygen level) today?" "What is your usual oxygen saturation reading?" (e.g., 95%)       No 11. PREGNANCY: "Is there any chance you are pregnant?" "When was your last menstrual period?"       N/A due to age 65. TRAVEL: "Have you traveled out of  the country in the last month?" (e.g., travel history, exposures)       Not asked  Protocols used: Breathing Difficulty-A-AH

## 2020-08-12 NOTE — Telephone Encounter (Signed)
Noted pt is going to ER.  KP

## 2020-08-12 NOTE — Telephone Encounter (Signed)
Pt called in c/o shortness of breath at rest and onset of A. Fib. During the night.   She had Covid 3 wks ago and was feeling better though she has had shortness of breath for 10 days.  This morning she woke up at 2:30 AM with worsening shortness of breath and onset of A Fib which she has on and off.   She took her cardiac medications for the A. Fib but still feels like she is still in A. Fib.   Denies chest pain/discomfort but the shortness of breath and wheezing are new and worse.  She requested to go to the ED which I agreed she should go.   Her husband is going to take her to [facility] now.     I sent my notes to Dr. Halina Maidens at [facility] for her information.

## 2020-08-12 NOTE — ED Notes (Signed)
C/o fatigue

## 2020-08-12 NOTE — ED Provider Notes (Signed)
[facility] Emergency Department Provider Note   ____________________________________________   Event Date/Time   First MD Initiated Contact with Patient 08/12/20 [phone]     (approximate)  I have reviewed the triage vital signs and the nursing notes.   HISTORY  Chief Complaint Atrial Fibrillation and Shortness of Breath    HPI Judith Wood is a 65 y.o. female with the below stated past medical history who presents for shortness of breath that began approximately 6 hours prior to arrival and has been stable since onset.  Patient states that it is worsened with any exertion and speaking and partially resolves at rest.  Patient is very concerned that she may have blood clots in her lungs given that she was diagnosed with COVID approximately 3 weeks ago and had a sister that passed away recently from complications from [place].  Patient also states that she does have a history of recurrent bronchitis and feels that the symptoms are somewhat similar however she denies any productive cough she does feel that she has subjective wheezing and "pain in my bronchi".  Patient currently denies any vision changes, tinnitus, difficulty speaking, facial droop, sore throat, abdominal pain, nausea/vomiting/diarrhea, dysuria, or weakness/numbness/paresthesias in any extremity         Past Medical History:  Diagnosis Date   Afib ([place])    Hypertension     Patient Active Problem List   Diagnosis Date Noted   Chronic left shoulder pain 04/05/2020   Benign essential HTN 10/01/2017   Seborrheic keratoses, inflamed 08/31/2016   Paroxysmal atrial fibrillation ([place]) 01/18/2016   Hyperlipidemia 01/18/2016   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   Vitamin D deficiency 01/18/2016   Abnormal mammogram of right breast 01/18/2016    Past Surgical History:  Procedure Laterality Date   ABDOMINAL HYSTERECTOMY  2000   ovarian cyst; cervix remains   CHOLECYSTECTOMY      ROUX-EN-Y GASTRIC BYPASS  2009   WRIST SURGERY      Prior to Admission medications   Medication Sig Start Date End Date Taking? Authorizing Provider  amoxicillin-clavulanate (AUGMENTIN) 875-125 MG tablet Take 1 tablet by mouth 2 (two) times daily for 5 days. 08/12/20 08/17/20 Yes Naaman Plummer, MD  aspirin 81 MG chewable tablet Chew by mouth daily.    [provider]  b complex vitamins tablet Take 1 tablet by mouth daily.    [provider]  Calcium Citrate-Vitamin D (CALCIUM CITRATE + D PO) Take 4 tablets by mouth.    [provider]  cholecalciferol (VITAMIN D) 400 units TABS tablet Take 400 Units by mouth.    [provider]  DILT-XR 180 MG 24 hr capsule Take 1 capsule by mouth once daily 06/12/20   Glean Hess, MD  ferrous sulfate 325 (65 FE) MG EC tablet Take 325 mg by mouth daily with breakfast.     [provider]  flecainide (TAMBOCOR) 150 MG tablet Take 1 tablet (150 mg total) by mouth 2 (two) times daily as needed. 06/11/20   Minna Merritts, MD  glucosamine-chondroitin 500-400 MG tablet Take 1 tablet by mouth 3 (three) times daily.    [provider]  magnesium 30 MG tablet Take 30 mg by mouth 2 (two) times daily.    [provider]  Probiotic Product (PROBIOTIC PO) Take by mouth daily.    [provider]  propranolol (INDERAL) 20 MG tablet Take 1 tablet (20 mg total) by mouth 3 (three) times daily as needed.  06/11/20   Minna Merritts, MD    Allergies Norco [hydrocodone-acetaminophen] and Tetracyclines & related  Family History  Problem Relation Age of Onset   Hypertension Mother    Arrhythmia Mother        Pacemaker implant    Heart attack Father 74   Arrhythmia Sister    Arrhythmia Brother    Arrhythmia Brother    Breast cancer Neg Hx     Social History Social History   Tobacco Use   Smoking status: Never   Smokeless tobacco: Never  Vaping Use   Vaping Use: Never used  Substance Use  Topics   Alcohol use: No   Drug use: No    Review of Systems Constitutional: No fever/chills Eyes: No visual changes. ENT: No sore throat. Cardiovascular: Denies chest pain. Respiratory: Denies shortness of breath. Gastrointestinal: No abdominal pain.  No nausea, no vomiting.  No diarrhea. Genitourinary: Negative for dysuria. Musculoskeletal: Negative for acute arthralgias Skin: Negative for rash. Neurological: Negative for headaches, weakness/numbness/paresthesias in any extremity Psychiatric: Negative for suicidal ideation/homicidal ideation   ____________________________________________   PHYSICAL EXAM:  VITAL SIGNS: ED Triage Vitals  Enc Vitals Group     BP 08/12/20 0933 116/71     Pulse Rate 08/12/20 0933 62     Resp 08/12/20 0933 17     Temp 08/12/20 0933 98.3 F (36.8 C)     Temp Source 08/12/20 0933 Oral     SpO2 08/12/20 0933 97 %     Weight 08/12/20 0934 240 lb (108.9 kg)     Height 08/12/20 0934 5\' 5"  (1.651 m)     Head Circumference --      Peak Flow --      Pain Score 08/12/20 0934 0     Pain Loc --      Pain Edu? --      Excl. in [place]? --    Constitutional: Alert and oriented. Well appearing and in no acute distress. Eyes: Conjunctivae are normal. PERRL. Head: Atraumatic. Nose: No congestion/rhinnorhea. Mouth/Throat: Mucous membranes are moist. Neck: No stridor Cardiovascular: Grossly normal heart sounds.  Good peripheral circulation. Respiratory: Normal respiratory effort.  No retractions. Gastrointestinal: Soft and nontender. No distention. Musculoskeletal: No obvious deformities Neurologic:  Normal speech and language. No gross focal neurologic deficits are appreciated. Skin:  Skin is warm and dry. No rash noted. Psychiatric: Mood and affect are normal. Speech and behavior are normal.  ____________________________________________   LABS (all labs ordered are listed, but only abnormal results are displayed)  Labs Reviewed  BRAIN NATRIURETIC  PEPTIDE - Abnormal; Notable for the following components:      Result Value   B Natriuretic Peptide 165.9 (*)    All other components within normal limits  COMPREHENSIVE METABOLIC PANEL - Abnormal; Notable for the following components:   Sodium 132 (*)    Chloride 97 (*)    Calcium 8.4 (*)    All other components within normal limits  CBC WITH DIFFERENTIAL/PLATELET - Abnormal; Notable for the following components:   Hemoglobin 9.9 (*)    HCT 32.3 (*)    MCV 74.3 (*)    MCH 22.8 (*)    RDW 19.1 (*)    Platelets 439 (*)    All other components within normal limits  D-DIMER, QUANTITATIVE  TROPONIN I (HIGH SENSITIVITY)  TROPONIN I (HIGH SENSITIVITY)   ____________________________________________  EKG  ED ECG REPORT I, Naaman Plummer, the attending physician, personally viewed and interpreted this ECG.  Date:  08/12/2020 EKG Time: 0937 Rate: 63 Rhythm: normal sinus rhythm QRS Axis: normal Intervals: normal ST/T Wave abnormalities: normal Narrative Interpretation: no evidence of acute ischemia  ____________________________________________  RADIOLOGY  ED MD interpretation: One-view portable chest x-ray shows no evidence of acute abnormalities including no pneumonia, pneumothorax, or widened mediastinum  Official radiology report(s): No results found.  ____________________________________________   PROCEDURES  Procedure(s) performed (including Critical Care):  .1-3 Lead EKG Interpretation  Date/Time: 08/13/2020 4:20 PM Performed by: Naaman Plummer, MD Authorized by: Naaman Plummer, MD     Interpretation: normal     ECG rate:  62   ECG rate assessment: normal     Rhythm: sinus rhythm     Ectopy: none     Conduction: normal     ____________________________________________   INITIAL IMPRESSION / ASSESSMENT AND PLAN / ED COURSE  As part of my medical decision making, I reviewed the following data within the [place] notes reviewed  and incorporated, Labs reviewed, EKG interpreted, Old chart reviewed, Radiograph reviewed and Notes from prior ED visits reviewed and incorporated        The patient is suffering from shortness of breath, but the immediate cause is not apparent.  Potential causes considered include, but are not limited to, asthma or COPD, congestive heart failure, pulmonary embolism, pneumothorax, coronary syndrome, pneumonia, and pleural effusion.  Despite the evaluation including history, exam, and testing, the cause of the shortness of breath remains unclear. However, during the ED stay, patients condition improved, and at the time of discharge the shortness of breath is resolved, they are feeling well, and want to go home.  Patient did request antibiotic therapy for her persistent cough and concerns for possible bacterial bronchitis given prolonged symptoms of approximately 2 weeks.  Patient will be discharged with strict return precautions and advice to follow up with primary MD within 24 hours for further evaluation.      ____________________________________________   FINAL CLINICAL IMPRESSION(S) / ED DIAGNOSES  Final diagnoses:  SOB (shortness of breath)  Bronchitis     ED Discharge Orders          Ordered    amoxicillin-clavulanate (AUGMENTIN) 875-125 MG tablet  2 times daily        08/12/20 1315             Note:  This document was prepared using Dragon voice recognition software and may include unintentional dictation errors.    Naaman Plummer, MD 08/13/20 1620

## 2020-08-12 NOTE — ED Triage Notes (Signed)
Pt comes into the ED via [facility] clinic for a-fib and SHOB.  Pt has a h/o of afib that she is able to control with medication.  Pt did not take the medication today and states she had much more SHOB than normal.  Pt ion NAD at this time with even and unlabored respirations.  Pt denies any chest pain or fluttering at this current time.

## 2020-08-18 NOTE — Telephone Encounter (Signed)
Attempted to schedule.  Lmov to call office.

## 2020-08-24 ENCOUNTER — Encounter: Payer: Self-pay | Admitting: Cardiovascular Disease

## 2020-08-24 ENCOUNTER — Ambulatory Visit (INDEPENDENT_AMBULATORY_CARE_PROVIDER_SITE_OTHER): Payer: Self-pay

## 2020-08-24 ENCOUNTER — Other Ambulatory Visit: Payer: Self-pay

## 2020-08-24 ENCOUNTER — Ambulatory Visit (INDEPENDENT_AMBULATORY_CARE_PROVIDER_SITE_OTHER): Payer: Self-pay | Admitting: Cardiovascular Disease

## 2020-08-24 VITALS — BP 120/70 | HR 68 | Ht 65.0 in | Wt 243.5 lb

## 2020-08-24 DIAGNOSIS — I480 Paroxysmal atrial fibrillation: Secondary | ICD-10-CM

## 2020-08-24 NOTE — Patient Instructions (Addendum)
Medication Instructions:  No changes  If you need a refill on your cardiac medications before your next appointment, please call your pharmacy.    Lab work: No new labs needed  Testing/Procedures: Zio (heart monitor) 14 days Cost $395 [employer] will be sent you, then may give to your church for assistance OR Call [phone] Select option 4 They can help with assistance, may be no out of pocket expense if qualify    Follow-Up: At [facility], you and your health needs are our priority.  As part of our continuing mission to provide you with exceptional heart care, we have created designated Provider Care Teams.  These Care Teams include your primary Cardiologist (physician) and Advanced Practice Providers (APPs -  Physician Assistants and Nurse Practitioners) who all work together to provide you with the care you need, when you need it.  You will need a follow up appointment in 12 months  Providers on your designated Care Team:   Murray Hodgkins, NP Christell Faith, PA-C Marrianne Mood, PA-C Cadence [place], [place]  COVID-19 Vaccine Information can be found at: ShippingScam.co.uk For questions related to vaccine distribution or appointments, please email [email or web address] or call [phone].   Your physician has recommended that you wear a Zio monitor.   This monitor is a medical device that records the heart's electrical activity. Doctors most often use these monitors to diagnose arrhythmias. Arrhythmias are problems with the speed or rhythm of the heartbeat. The monitor is a small device applied to your chest. You can wear one while you do your normal daily activities. While wearing this monitor if you have any symptoms to push the button and record what you felt. Once you have worn this monitor for the period of time provider prescribed (Usually 14 days), you will return the monitor device in the postage paid box. Once  it is returned they will download the data collected and provide [place] with a report which the provider will then review and we will call you with those results. Important tips:  Avoid showering during the first 24 hours of wearing the monitor. Avoid excessive sweating to help maximize wear time. Do not submerge the device, no hot tubs, and no swimming pools. Keep any lotions or oils away from the patch. After 24 hours you may shower with the patch on. Take brief showers with your back facing the shower head.  Do not remove patch once it has been placed because that will interrupt data and decrease adhesive wear time. Push the button when you have any symptoms and write down what you were feeling. Once you have completed wearing your monitor, remove and place into box which has postage paid and place in your outgoing mailbox.  If for some reason you have misplaced your box then call our office and we can provide another box and/or mail it off for you.

## 2020-08-24 NOTE — Progress Notes (Signed)
Cardiology Office Note  Date:  08/24/2020   ID:  Judith Wood, DOB 04-15-1955, MRN [id number]  PCP:  Judith Hess, MD   Chief Complaint  Patient presents with   [facility] follow up     Patient c/o being in A-fib. Medications reviewed by the patient verbally.     HPI:  [name] is a 65 year old woman with past medical history of Diagnosed with paroxysmal atrial fibrillation by outside cardiologist [place] Hyperlipidemia Hypertension History of smoking A. Fib, CHADS VASC 2, not on anticoagulation secondary to no insurance Who presents for follow-up of her paroxysmal atrial fibrillation, recent COVID infection  Last seen in clinic April 2022  On today's visit reports that she had COVID end of May 2022, worsening symptoms into June Went to the ER August 12, 2020 EKG showed normal sinus rhythm Cause of her shortness of breath was unclear Was discharged with Augmentin  Feels she is in and out of atrial fibrillation At least once or twice a week has had to take propranolol with flecainide Not on NOAC, no insurance for coverage Symptoms are short  Discussed recent lab work with her CBC: HGB 9.9 Neg occult  EKG personally reviewed by myself on todays visit NSR rate 68 bpm no ST or T wave changes  Echocardiogram August 2019 NORMAL LEFT VENTRICULAR SYSTOLIC FUNCTION   WITH MILD LVH  NORMAL RIGHT VENTRICULAR SYSTOLIC FUNCTION  MILD VALVULAR REGURGITATION (See above)  NO VALVULAR STENOSIS  TRIVIAL MR  MILD TR  EF 50%   Outside stress test August 2019, treadmill only   PMH:   has a past medical history of Afib ([place]) and Hypertension.  PSH:    Past Surgical History:  Procedure Laterality Date   ABDOMINAL HYSTERECTOMY  2000   ovarian cyst; cervix remains   CHOLECYSTECTOMY     ROUX-EN-Y GASTRIC BYPASS  2009   WRIST SURGERY      Current Outpatient Medications  Medication Sig Dispense Refill   aspirin 81 MG chewable tablet Chew by mouth daily.     b complex  vitamins tablet Take 1 tablet by mouth daily.     Calcium Citrate-Vitamin D (CALCIUM CITRATE + D PO) Take 4 tablets by mouth.     cholecalciferol (VITAMIN D) 400 units TABS tablet Take 400 Units by mouth.     DILT-XR 180 MG 24 hr capsule Take 1 capsule by mouth once daily 90 capsule 1   ferrous sulfate 325 (65 FE) MG EC tablet Take 325 mg by mouth daily with breakfast.      flecainide (TAMBOCOR) 150 MG tablet Take 1 tablet (150 mg total) by mouth 2 (two) times daily as needed. 30 tablet 1   glucosamine-chondroitin 500-400 MG tablet Take 1 tablet by mouth 3 (three) times daily.     magnesium 30 MG tablet Take 30 mg by mouth 2 (two) times daily.     Probiotic Product (PROBIOTIC PO) Take by mouth daily.     propranolol (INDERAL) 20 MG tablet Take 1 tablet (20 mg total) by mouth 3 (three) times daily as needed. 90 tablet 1   No current facility-administered medications for this visit.    Allergies:   Norco [hydrocodone-acetaminophen] and Tetracyclines & related   Social History:  The patient  reports that she has never smoked. She has never used smokeless tobacco. She reports that she does not drink alcohol and does not use drugs.   Family History:   family history includes Arrhythmia in her brother, brother, mother,  and sister; Heart attack (age of onset: 92) in her father; Hypertension in her mother.    Review of Systems: Review of Systems  Constitutional: Negative.   HENT: Negative.    Respiratory: Negative.    Cardiovascular:  Positive for palpitations.  Gastrointestinal: Negative.   Musculoskeletal: Negative.   Neurological: Negative.   Psychiatric/Behavioral: Negative.    All other systems reviewed and are negative.  PHYSICAL EXAM: VS:  BP 120/70 (BP Location: Left Arm, Patient Position: Sitting, Cuff Size: Large)   Pulse 68   Ht 5\' 5"  (1.651 m)   Wt 243 lb 8 oz (110.5 kg)   SpO2 99%   BMI 40.52 kg/m  , BMI Body mass index is 40.52 kg/m. GEN: Well nourished, well  developed, in no acute distress HEENT: normal Neck: no JVD, carotid bruits, or masses Cardiac: RRR; no murmurs, rubs, or gallops,no edema  Respiratory:  clear to auscultation bilaterally, normal work of breathing GI: soft, nontender, nondistended, + BS MS: no deformity or atrophy Skin: warm and dry, no rash Neuro:  Strength and sensation are intact Psych: euthymic mood, full affect  Recent Labs: 08/12/2020: ALT 22; B Natriuretic Peptide 165.9; BUN 19; Creatinine, Ser 0.71; Hemoglobin 9.9; Platelets 439; Potassium 4.6; Sodium 132    Lipid Panel Lab Results  Component Value Date   CHOL 161 03/16/2020   HDL 45 03/16/2020   LDLCALC 88 03/16/2020   TRIG 188 (A) 03/16/2020      Wt Readings from Last 3 Encounters:  08/24/20 243 lb 8 oz (110.5 kg)  08/12/20 240 lb (108.9 kg)  06/07/20 247 lb 2 oz (112.1 kg)     ASSESSMENT AND PLAN:  Atrial fibrillation, paroxysmal 1-2 episodes  Per week since covid, increasing frequency Not on NOAC, secondary to no insurance Zio ordered Metoprolol succinate 25 daily was offered, she prefers monitor first Recommend we continue flecainide PRN, propranolol PRN Discussed CHADS VASC 2 Would consider Pradaxa when this goes generic  HTN: Blood pressure is well controlled on today's visit. No changes made to the medications.    Total encounter time more than 25 minutes  Greater than 50% was spent in counseling and coordination of care with the patient    Signed, Esmond Plants, M.D., Ph.D. [place], [place]

## 2020-10-02 ENCOUNTER — Ambulatory Visit
Admission: RE | Admit: 2020-10-02 | Discharge: 2020-10-02 | Disposition: A | Discharge status: Home / Self Care | Payer: Self-pay | Source: Ambulatory Visit | Attending: Family Medicine | Admitting: Family Medicine

## 2020-10-02 ENCOUNTER — Other Ambulatory Visit: Payer: Self-pay

## 2020-10-02 VITALS — BP 146/83 | HR 75 | Temp 97.8°F | Resp 18

## 2020-10-02 DIAGNOSIS — N3090 Cystitis, unspecified without hematuria: Secondary | ICD-10-CM

## 2020-10-02 DIAGNOSIS — R300 Dysuria: Secondary | ICD-10-CM

## 2020-10-02 LAB — POCT URINALYSIS DIP (MANUAL ENTRY)
Bilirubin, UA: NEGATIVE
Glucose, UA: NEGATIVE mg/dL
Ketones, POC UA: NEGATIVE mg/dL
Nitrite, UA: NEGATIVE
Protein Ur, POC: NEGATIVE mg/dL
Spec Grav, UA: 1.015 (ref 1.010–1.025)
Urobilinogen, UA: 0.2 E.U./dL
pH, UA: 6.5 (ref 5.0–8.0)

## 2020-10-02 MED ORDER — CEPHALEXIN 500 MG PO CAPS: 500.0000 mg | ORAL_CAPSULE | Freq: Two times a day (BID) | ORAL | 0 refills | Status: DC

## 2020-10-02 NOTE — ED Triage Notes (Signed)
Patient presents to Urgent Care with complaints of possible UTIs. She states she has been exp dysuria, lower abdominal pain, and increased urgency since Thurs. She states she has a hx of UTIs.  Denies fever or hematuria.

## 2020-10-02 NOTE — ED Provider Notes (Signed)
  [place]    ASSESSMENT & PLAN:  1. Dysuria   2. Cystitis    Begin: Meds ordered this encounter  Medications   cephALEXin (KEFLEX) 500 MG capsule    Sig: Take 1 capsule (500 mg total) by mouth 2 (two) times daily.    Dispense:  10 capsule    Refill:  0   Declines urine culture. No signs of pyelonephritis.   Follow-up Information     Glean Hess, MD.   Specialty: Internal Medicine Why: As needed. Contact information: [address] [place] [address] [phone]                 Outlined signs and symptoms indicating need for more acute intervention. Patient verbalized understanding. After Visit Summary given.  SUBJECTIVE:  Judith Wood is a 65 y.o. female who complains of dysuria for the past 2-3 d. Without associated flank pain, fever, chills, vaginal discharge or bleeding. Gross hematuria: not present. No specific aggravating or alleviating factors reported. No LE edema. Normal PO intake without n/v/d. Without specific abdominal pain. Ambulatory without difficulty.  LMP: No LMP recorded. Patient has had a hysterectomy.  OBJECTIVE:  Vitals:   10/02/20 1334  BP: (!) 146/83  Pulse: 75  Resp: 18  Temp: 97.8 F (36.6 C)  TempSrc: Oral  SpO2: 96%   General appearance: alert; no distress Lungs: unlabored respirations Extremities: no edema; symmetrical with no gross deformities Skin: warm and dry Neurologic: normal gait Psychological: alert and cooperative; normal mood and affect  Labs Reviewed  POCT URINALYSIS DIP (MANUAL ENTRY) - Abnormal; Notable for the following components:      Result Value   Clarity, UA cloudy (*)    Blood, UA trace-intact (*)    Leukocytes, UA Moderate (2+) (*)    All other components within normal limits  URINE CULTURE    Allergies  Allergen Reactions   Norco [Hydrocodone-Acetaminophen]    Tetracyclines & Related     Past Medical History:  Diagnosis Date   Afib (HCC)     Hypertension    Social History   Socioeconomic History   Marital status: Married    Spouse name: Not on file   Number of children: 1   Years of education: Not on file   Highest education level: Not on file  Occupational History   Not on file  Tobacco Use   Smoking status: Never   Smokeless tobacco: Never  Vaping Use   Vaping Use: Never used  Substance and Sexual Activity   Alcohol use: No   Drug use: No   Sexual activity: Not on file  Other Topics Concern   Not on file  Social History Narrative   Not on file   Social Determinants of Health   Financial Resource Strain: Not on file  Food Insecurity: Not on file  Transportation Needs: Not on file  Physical Activity: Not on file  Stress: Not on file  Social Connections: Not on file  Intimate Partner Violence: Not on file   Family History  Problem Relation Age of Onset   Hypertension Mother    Arrhythmia Mother        Pacemaker implant    Heart attack Father 44   Arrhythmia Sister    Arrhythmia Brother    Arrhythmia Brother    Breast cancer Neg Hx         Vanessa Kick, MD 10/02/20 1346

## 2020-10-05 ENCOUNTER — Telehealth: Payer: Self-pay | Admitting: Cardiovascular Disease

## 2020-10-05 NOTE — Telephone Encounter (Signed)
Patient calling to check the status of ZIO results Says she turned it in on 07/07

## 2020-10-05 NOTE — Telephone Encounter (Signed)
MyChart message sent explaining will call with ZIO results once Dr. Rockey Situ has reviewed.  Preliminary results reviewed by nurse.

## 2020-10-14 ENCOUNTER — Telehealth: Payer: Self-pay

## 2020-10-14 NOTE — Telephone Encounter (Signed)
Attempted to reach pt via phone, no answer, LM on home phone to call back for results of heart monitor.

## 2020-10-14 NOTE — Telephone Encounter (Signed)
Was able to reach out to pt via phone and make contact to review their recent ZIO monitor results. Dr. Rockey Situ advised based on the current results   "Event monitor  Having paroxysmal atrial fib, this is not new  Would suggest anticoagulation for stroke prevention  Eliquis or xarelto patient assistance might be an option until pradaxa goes generic   To control atrial fib spells, could start metoprolol succinate  additionally, we can add flecainide BID with metoprolol if needed"  Pt verbalized understanding, had seen results on her MyChart, pt voiced concern for starting anticoagulation therapy as she is self-pay and knows those medications are expensive, reports unable to apply for PA at this time. Reports her insurance does not start till next year. Mrs. Pepple would like an in office visit to discuss medications and treatment that would be lost-cost to her and what options she has with her paroxysmal atrial fib.  Otherwise Mrs. Herzberg is thankful for the results call, all questions and concerns were address. Appt made for Monday 8/15 at 4:20 pm with Dr. Rockey Situ.

## 2020-10-14 NOTE — Telephone Encounter (Signed)
Patient returning call for results 

## 2020-10-17 NOTE — Progress Notes (Signed)
Cardiology Office Note  Date:  10/18/2020   ID:  Judith Wood, DOB 03/06/1956, MRN [id number]  PCP:  Glean Hess, MD   Chief Complaint  Patient presents with   Follow-up    Discuss Zio monitor results. Medications reviewed by the patient verbally.     HPI:  Judith Wood is a 65 year old woman with past medical history of Diagnosed with paroxysmal atrial fibrillation by outside cardiologist kernodle Hyperlipidemia Hypertension History of smoking A. Fib, CHADS VASC 2, not on anticoagulation secondary to no insurance Who presents for follow-up of her paroxysmal atrial fibrillation, recent COVID infection  In follow-up today reports feeling relatively well Occasionally appreciates episodes of atrial fibrillation Increased her walking,  Watching diet and effort to lose weight  Continues on diltiazem in the evening, occasional flecainide as needed for symptoms of palpitations, occasional propranolol Not on NOAC, no insurance for coverage  Recent ZIO monitor reviewed Patient had a min HR of 46 bpm, max HR of 175 bpm, and avg HR of 65 bpm.  Predominant underlying rhythm was Sinus Rhythm.    1 run of Ventricular Tachycardia occurred lasting 5 beats with a max rate of 135 bpm (avg 127 bpm).    Atrial Fibrillation/Flutter occurred (2% burden), ranging from 50-175 bpm (avg of 74 bpm), the longest lasting 6 hours 9 mins with an avg rate of 74 bpm.  Atrial Fibrillation/Flutter was detected within +/- 45 seconds of symptomatic patient event(s).    Isolated SVEs were rare (<1.0%),SVE Couplets were rare (<1.0%), and SVE Triplets were rare (<1.0%). Isolated VEs were rare (<1.0%, 874), VE Couplets were rare (<1.0%, 7), and VE Triplets were rare (<1.0%, 1). Ventricular Trigeminy was present.    COVID end of May 2022, worsening symptoms into June Went to the ER August 12, 2020 EKG showed normal sinus rhythm  CBC: HGB 9.9 Neg occult  EKG personally reviewed by myself on todays  visit NSR rate 65 bpm no ST or T wave changes  Echocardiogram August 2019 NORMAL LEFT VENTRICULAR SYSTOLIC FUNCTION   WITH MILD LVH  NORMAL RIGHT VENTRICULAR SYSTOLIC FUNCTION  MILD VALVULAR REGURGITATION (See above)  NO VALVULAR STENOSIS  TRIVIAL MR  MILD TR  EF 50%   Outside stress test August 2019, treadmill only   PMH:   has a past medical history of Afib ([place]) and Hypertension.  PSH:    Past Surgical History:  Procedure Laterality Date   ABDOMINAL HYSTERECTOMY  2000   ovarian cyst; cervix remains   CHOLECYSTECTOMY     ROUX-EN-Y GASTRIC BYPASS  2009   WRIST SURGERY      Current Outpatient Medications  Medication Sig Dispense Refill   b complex vitamins tablet Take 1 tablet by mouth daily.     Calcium Citrate-Vitamin D (CALCIUM CITRATE + D PO) Take 4 tablets by mouth.     cholecalciferol (VITAMIN D) 400 units TABS tablet Take 400 Units by mouth.     DILT-XR 180 MG 24 hr capsule Take 1 capsule by mouth once daily 90 capsule 1   Fe Bisgly-Vit C-Vit B12-FA (GENTLE IRON PO) Take 50 mg by mouth daily.     flecainide (TAMBOCOR) 150 MG tablet Take 1 tablet (150 mg total) by mouth 2 (two) times daily as needed. 30 tablet 1   glucosamine-chondroitin 500-400 MG tablet Take 1 tablet by mouth 3 (three) times daily.     magnesium 30 MG tablet Take 30 mg by mouth 2 (two) times daily.     Probiotic Product (  PROBIOTIC PO) Take by mouth daily.     propranolol (INDERAL) 20 MG tablet Take 1 tablet (20 mg total) by mouth 3 (three) times daily as needed. 90 tablet 1   No current facility-administered medications for this visit.    Allergies:   Norco [hydrocodone-acetaminophen] and Tetracyclines & related   Social History:  The patient  reports that she has never smoked. She has never used smokeless tobacco. She reports that she does not drink alcohol and does not use drugs.   Family History:   family history includes Arrhythmia in her brother, brother, mother, and sister; Heart attack  (age of onset: 65) in her father; Hypertension in her mother.    Review of Systems: Review of Systems  Constitutional: Negative.   HENT: Negative.    Respiratory: Negative.    Cardiovascular:  Positive for palpitations.  Gastrointestinal: Negative.   Musculoskeletal: Negative.   Neurological: Negative.   Psychiatric/Behavioral: Negative.    All other systems reviewed and are negative.  PHYSICAL EXAM: VS:  BP 130/80 (BP Location: Left Arm, Patient Position: Sitting, Cuff Size: Large)   Pulse 65   Ht '5\' 5"'$  (1.651 m)   Wt 242 lb 8 oz (110 kg)   SpO2 98%   BMI 40.35 kg/m  , BMI Body mass index is 40.35 kg/m. Constitutional:  oriented to person, place, and time. No distress.  HENT:  Head: Grossly normal Eyes:  no discharge. No scleral icterus.  Neck: No JVD, no carotid bruits  Cardiovascular: Regular rate and rhythm, no murmurs appreciated Pulmonary/Chest: Clear to auscultation bilaterally, no wheezes or rails Abdominal: Soft.  no distension.  no tenderness.  Musculoskeletal: Normal range of motion Neurological:  normal muscle tone. Coordination normal. No atrophy Skin: Skin warm and dry Psychiatric: normal affect, pleasant   Recent Labs: 08/12/2020: ALT 22; B Natriuretic Peptide 165.9; BUN 19; Creatinine, Ser 0.71; Hemoglobin 9.9; Platelets 439; Potassium 4.6; Sodium 132    Lipid Panel Lab Results  Component Value Date   CHOL 161 03/16/2020   HDL 45 03/16/2020   LDLCALC 88 03/16/2020   TRIG 188 (A) 03/16/2020      Wt Readings from Last 3 Encounters:  10/18/20 242 lb 8 oz (110 kg)  08/24/20 243 lb 8 oz (110.5 kg)  08/12/20 240 lb (108.9 kg)     ASSESSMENT AND PLAN:  Atrial fibrillation, paroxysmal Monitor reviewed showing 2% burden atrial fibrillation, frequent paroxysmal episodes Discussed CHADS VASC 2 Would consider Pradaxa when this goes generic Does not want warfarin or Eliquis or Xarelto Currently feels comfortable not being on anticoagulation, aware of  risk and benefit including stroke Recommend she stay on her diltiazem extended release at current dose add metoprolol succinate 25 daily Suggested she consider apple watch, for close monitoring  HTN: Addition to metoprolol succinate 25 mg daily as above Continue other medications   Total encounter time more than 25 minutes  Greater than 50% was spent in counseling and coordination of care with the patient    Signed, Esmond Plants, M.D., Ph.D. [place], [place]

## 2020-10-18 ENCOUNTER — Encounter: Payer: Self-pay | Admitting: Cardiovascular Disease

## 2020-10-18 ENCOUNTER — Other Ambulatory Visit: Payer: Self-pay

## 2020-10-18 ENCOUNTER — Ambulatory Visit (INDEPENDENT_AMBULATORY_CARE_PROVIDER_SITE_OTHER): Payer: Self-pay | Admitting: Cardiovascular Disease

## 2020-10-18 VITALS — BP 130/80 | HR 65 | Ht 65.0 in | Wt 242.5 lb

## 2020-10-18 DIAGNOSIS — E782 Mixed hyperlipidemia: Secondary | ICD-10-CM

## 2020-10-18 DIAGNOSIS — I480 Paroxysmal atrial fibrillation: Secondary | ICD-10-CM

## 2020-10-18 DIAGNOSIS — I10 Essential (primary) hypertension: Secondary | ICD-10-CM

## 2020-10-18 MED ORDER — METOPROLOL SUCCINATE ER 25 MG PO TB24: 25.0000 mg | ORAL_TABLET | Freq: Every day | ORAL | 4 refills | Status: DC

## 2020-10-18 NOTE — Patient Instructions (Addendum)
Medication Instructions:  Please start metoprolol succinate 25 mg daily  If you need a refill on your cardiac medications before your next appointment, please call your pharmacy.   Lab work: No new labs needed   If you have labs (blood work) drawn today and your tests are completely normal, you will receive your results only by: [place] (if you have MyChart) OR A paper copy in the mail If you have any lab test that is abnormal or we need to change your treatment, we will call you to review the results.   Testing/Procedures: No new testing needed   Follow-Up: At [facility], you and your health needs are our priority.  As part of our continuing mission to provide you with exceptional heart care, we have created designated Provider Care Teams.  These Care Teams include your primary Cardiologist (physician) and Advanced Practice Providers (APPs -  Physician Assistants and Nurse Practitioners) who all work together to provide you with the care you need, when you need it.  You will need a follow up appointment in 6 months  Providers on your designated Care Team:   Murray Hodgkins, NP Christell Faith, PA-C Marrianne Mood, PA-C Cadence [place], [place]  Any Other Special Instructions Will Be Listed Below (If Applicable).  COVID-19 Vaccine Information can be found at: ShippingScam.co.uk For questions related to vaccine distribution or appointments, please email [email or web address] or call [phone].

## 2020-12-15 ENCOUNTER — Ambulatory Visit (INDEPENDENT_AMBULATORY_CARE_PROVIDER_SITE_OTHER): Payer: Self-pay | Admitting: Podiatry

## 2020-12-15 ENCOUNTER — Other Ambulatory Visit: Payer: Self-pay

## 2020-12-15 DIAGNOSIS — M722 Plantar fascial fibromatosis: Secondary | ICD-10-CM

## 2020-12-15 NOTE — Patient Instructions (Signed)

## 2020-12-16 NOTE — Progress Notes (Signed)
  Subjective:  Patient ID: Judith Wood, female    DOB: October 29, 1955,  MRN: [id number]  Chief Complaint  Patient presents with   Plantar Fasciitis      pain when walks with both feet left and right     65 y.o. female presents with the above complaint. History confirmed with patient.  Was doing fairly well until a month ago when the pain started to return  Objective:  Physical Exam: warm, good capillary refill, no trophic changes or ulcerative lesions, normal DP and PT pulses and normal sensory exam. 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 Plantar fasciitis of left foot      Plan:  Patient was evaluated and treated and all questions answered.  Discussed the etiology and treatment options for plantar fasciitis including stretching, formal physical therapy, supportive shoegears such as a running shoe or sneaker, pre fabricated orthoses, injection therapy, and oral medications. We also discussed the role of surgical treatment of this for patients who do not improve after exhausting non-surgical treatment options.   -XR reviewed with patient -Educated patient on stretching and icing of the affected limb -Injection delivered to the plantar fascia of the left foot.  After sterile prep with povidone-iodine solution and alcohol, the left heel was injected with 0.5cc 2% xylocaine plain, 0.5cc 0.5% marcaine plain, 10 mg triamcinolone acetonide, and 4 mg dexamethasone was injected along the medial plantar fascia at the insertion on the plantar calcaneus. The patient tolerated the procedure well without complication.   Return if symptoms worsen or fail to improve.

## 2020-12-22 ENCOUNTER — Other Ambulatory Visit: Payer: Self-pay | Admitting: Internal Medicine

## 2020-12-22 DIAGNOSIS — I10 Essential (primary) hypertension: Secondary | ICD-10-CM

## 2020-12-22 NOTE — Telephone Encounter (Signed)
Requested Prescriptions  Pending Prescriptions Disp Refills  . DILT-XR 180 MG 24 hr capsule [Pharmacy Med Name: Dilt-XR 180 MG Oral Capsule Extended Release 24 Hour] 90 capsule 0    Sig: Take 1 capsule by mouth once daily     Cardiovascular:  Calcium Channel Blockers Failed - 12/22/2020  5:30 AM      Failed - Valid encounter within last 6 months    Recent Outpatient Visits          8 months ago Annual physical exam   [facility] Glean Hess, MD   1 year ago Annual physical exam   [facility] Glean Hess, MD   2 years ago Recurrent UTI   [facility] Glean Hess, MD   2 years ago Acute cystitis with hematuria   [facility] Glean Hess, MD   2 years ago Annual physical exam   [facility] Glean Hess, MD      Future Appointments            In 3 months Army Melia Jesse Sans, MD [facility], [place]   In 4 months Gollan, Kathlene November, MD [facility], [facility]           [place] BP in normal range    BP Readings from Last 1 Encounters:  10/18/20 130/80

## 2020-12-29 ENCOUNTER — Encounter: Payer: Self-pay | Admitting: Internal Medicine

## 2020-12-29 ENCOUNTER — Ambulatory Visit (INDEPENDENT_AMBULATORY_CARE_PROVIDER_SITE_OTHER): Payer: Self-pay | Admitting: Internal Medicine

## 2020-12-29 ENCOUNTER — Other Ambulatory Visit: Payer: Self-pay

## 2020-12-29 VITALS — BP 122/76 | HR 63 | Temp 98.1°F | Ht 65.0 in | Wt 244.8 lb

## 2020-12-29 DIAGNOSIS — Z6841 Body Mass Index (BMI) 40.0 and over, adult: Secondary | ICD-10-CM

## 2020-12-29 DIAGNOSIS — R300 Dysuria: Secondary | ICD-10-CM

## 2020-12-29 DIAGNOSIS — D509 Iron deficiency anemia, unspecified: Secondary | ICD-10-CM

## 2020-12-29 LAB — POCT URINALYSIS DIPSTICK
Bilirubin, UA: NEGATIVE
Blood, UA: NEGATIVE
Glucose, UA: NEGATIVE
Ketones, UA: NEGATIVE
Nitrite, UA: NEGATIVE
Protein, UA: NEGATIVE
Spec Grav, UA: 1.015 (ref 1.010–1.025)
Urobilinogen, UA: 0.2 E.U./dL
pH, UA: 6 (ref 5.0–8.0)

## 2020-12-29 MED ORDER — NITROFURANTOIN MONOHYD MACRO 100 MG PO CAPS: 100.0000 mg | ORAL_CAPSULE | Freq: Two times a day (BID) | ORAL | 0 refills | Status: DC

## 2020-12-29 NOTE — Progress Notes (Signed)
Date:  12/29/2020   Name:  Judith Wood   DOB:  October 08, 1955   MRN:  [id number]   Chief Complaint: Urinary Symptoms (X1.5 weeks; dysuria, burning with inflammation, lower abdominal pain; 1/10 pain)  Dysuria  This is a new problem. The current episode started 1 to 4 weeks ago. The problem occurs every urination. The problem has been unchanged. The quality of the pain is described as burning. The pain is at a severity of 0/10. The pain is mild. There has been no fever. Associated symptoms include flank pain, frequency and urgency. Pertinent negatives include no chills, hematuria, nausea or vomiting. She has tried increased fluids for the symptoms. The treatment provided mild relief.   Lab Results  Component Value Date   CREATININE 0.71 08/12/2020   BUN 19 08/12/2020   NA 132 (L) 08/12/2020   K 4.6 08/12/2020   CL 97 (L) 08/12/2020   CO2 28 08/12/2020   Lab Results  Component Value Date   CHOL 161 03/16/2020   HDL 45 03/16/2020   LDLCALC 88 03/16/2020   TRIG 188 (A) 03/16/2020   Lab Results  Component Value Date   TSH 2.86 03/05/2019   No results found for: HGBA1C Lab Results  Component Value Date   WBC 6.3 08/12/2020   HGB 9.9 (L) 08/12/2020   HCT 32.3 (L) 08/12/2020   MCV 74.3 (L) 08/12/2020   PLT 439 (H) 08/12/2020   Lab Results  Component Value Date   ALT 22 08/12/2020   AST 23 08/12/2020   ALKPHOS 68 08/12/2020   BILITOT 0.7 08/12/2020     Review of Systems  Constitutional:  Negative for chills, fatigue and fever.  Gastrointestinal:  Positive for abdominal pain. Negative for diarrhea, nausea and vomiting.  Genitourinary:  Positive for dysuria, flank pain, frequency and urgency. Negative for hematuria.   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   Vitamin D deficiency 01/18/2016   Abnormal mammogram of right breast 01/18/2016    Allergies  Allergen Reactions   Norco [Hydrocodone-Acetaminophen] Itching   Tetracyclines & Related Itching and Rash    Past Surgical History:  Procedure Laterality Date   ABDOMINAL HYSTERECTOMY  2000   ovarian cyst; cervix remains   CHOLECYSTECTOMY     ROUX-EN-Y GASTRIC BYPASS  2009   WRIST SURGERY      Social History   Tobacco Use   Smoking status: Never   Smokeless tobacco: Never  Vaping Use   Vaping Use: Never used  Substance Use Topics   Alcohol use: No   Drug use: No     Medication list has been reviewed and updated.  Current Meds  Medication Sig   b complex vitamins tablet Take 1 tablet by mouth daily.   Calcium Citrate-Vitamin D (CALCIUM CITRATE + D PO) Take 2 tablets by mouth daily.   CHOLECALCIFEROL PO Take 5,000 Units by mouth daily.   CRANBERRY EXTRACT PO Take 1 capsule by mouth daily.   D-MANNOSE PO Take 1 capsule by mouth daily.   DILT-XR 180 MG 24 hr capsule Take 1 capsule by mouth once daily   Fe Bisgly-Vit C-Vit B12-FA (GENTLE IRON PO) Take 25 mg by mouth in the morning and at bedtime.  flecainide (TAMBOCOR) 150 MG tablet Take 1 tablet (150 mg total) by mouth 2 (two) times daily as needed.   glucosamine-chondroitin 500-400 MG tablet Take 1 tablet by mouth 3 (three) times daily.   magnesium 30 MG tablet Take 30 mg by mouth 2 (two) times daily.   metoprolol succinate (TOPROL-XL) 25 MG 24 hr tablet Take 1 tablet (25 mg total) by mouth daily. Take with or immediately following a meal.   Probiotic Product (PROBIOTIC PO) Take 1 capsule by mouth daily.    PHQ 2/9 Scores 04/05/2020 03/19/2019 07/03/2018 05/31/2018  PHQ - 2 Score 0 0 0 0  PHQ- 9 Score 0 - 0 -    GAD 7 : Generalized Anxiety Score 04/05/2020  Nervous, Anxious, on Edge 0  Control/stop worrying 0  Worry too much - different things 0   Trouble relaxing 0  Restless 0  Easily annoyed or irritable 0  Afraid - awful might happen 0  Total GAD 7 Score 0    BP Readings from Last 3 Encounters:  12/29/20 122/76  10/18/20 130/80  10/02/20 (!) 146/83    Physical Exam Vitals and nursing note reviewed.  Constitutional:      Appearance: She is well-developed.  Cardiovascular:     Rate and Rhythm: Normal rate and regular rhythm.     Heart sounds: Normal heart sounds.  Pulmonary:     Effort: Pulmonary effort is normal. No respiratory distress.     Breath sounds: Normal breath sounds.  Abdominal:     General: Bowel sounds are normal.     Palpations: Abdomen is soft.     Tenderness: There is abdominal tenderness in the suprapubic area. There is no right CVA tenderness, left CVA tenderness, guarding or rebound.    Wt Readings from Last 3 Encounters:  12/29/20 244 lb 12.8 oz (111 kg)  10/18/20 242 lb 8 oz (110 kg)  08/24/20 243 lb 8 oz (110.5 kg)    BP 122/76 (BP Location: Left Arm, Patient Position: Sitting, Cuff Size: Large)   Pulse 63   Temp 98.1 F (36.7 C) (Oral)   Ht 5\' 5"  (1.651 m)   Wt 244 lb 12.8 oz (111 kg)   SpO2 96%   BMI 40.74 kg/m   Assessment and Plan: 1. Dysuria Continue to push fluids; follow up if no improvement - POCT urinalysis dipstick - nitrofurantoin, macrocrystal-monohydrate, (MACROBID) 100 MG capsule; Take 1 capsule (100 mg total) by mouth 2 (two) times daily for 7 days.  Dispense: 14 capsule; Refill: 0  2. Microcytic anemia With low iron - secondary to gastric bypass +/- hereditary factors Continue iron supplement 2 per day Will get iron and CBC at labs in January.  3. BMI 40.0-44.9, adult ([place]) Discussed diet options - [employer], IF, Keto   Partially dictated using Editor, commissioning. Any errors are unintentional.  Halina Maidens, MD [facility]  12/29/2020

## 2021-01-18 ENCOUNTER — Telehealth: Payer: Self-pay

## 2021-01-18 ENCOUNTER — Other Ambulatory Visit: Payer: Self-pay | Admitting: Internal Medicine

## 2021-01-18 DIAGNOSIS — R300 Dysuria: Secondary | ICD-10-CM

## 2021-01-18 MED ORDER — NITROFURANTOIN MONOHYD MACRO 100 MG PO CAPS: 100.0000 mg | ORAL_CAPSULE | Freq: Two times a day (BID) | ORAL | 0 refills | Status: AC

## 2021-01-18 NOTE — Telephone Encounter (Signed)
Patient informed. 

## 2021-01-18 NOTE — Telephone Encounter (Signed)
Copied from [place] [id number]. Topic: Quick Communication - Rx Refill/Question >> Jan 17, 2021  4:03 PM Pawlus, Apolonio Schneiders wrote: Reason for CRM: Pt was seen in the office on 10/26 for a UTI, pt stated it has come back and wanted to know if Dr Army Melia would be able to send something in for her or if she should return to the office, please advise.

## 2021-03-12 LAB — BASIC METABOLIC PANEL
BUN: 20 (ref 4–21)
CO2: 26 — AB (ref 13–22)
Chloride: 99 (ref 99–108)
Creatinine: 0.8 (ref 0.5–1.1)
Glucose: 90
Potassium: 4.7 (ref 3.4–5.3)
Sodium: 136 — AB (ref 137–147)

## 2021-03-12 LAB — COMPREHENSIVE METABOLIC PANEL
Albumin: 4.2 (ref 3.5–5.0)
Calcium: 9.3 (ref 8.7–10.7)
GFR calc non Af Amer: 78
Globulin: 3.1

## 2021-03-12 LAB — LIPID PANEL
Cholesterol: 160 (ref 0–200)
HDL: 45 (ref 35–70)
LDL Cholesterol: 91
LDl/HDL Ratio: 3.6
Triglycerides: 141 (ref 40–160)

## 2021-03-12 LAB — HEPATIC FUNCTION PANEL
ALT: 26 (ref 7–35)
AST: 36 — AB (ref 13–35)
Alkaline Phosphatase: 72 (ref 25–125)
Bilirubin, Total: 0.5

## 2021-03-12 LAB — CBC AND DIFFERENTIAL
HCT: 39 (ref 36–46)
Hemoglobin: 12.1 (ref 12.0–16.0)
Platelets: 383 (ref 150–399)
WBC: 4.6

## 2021-03-12 LAB — IRON,TIBC AND FERRITIN PANEL: Iron: 57

## 2021-03-12 LAB — CBC: RBC: 4.64 (ref 3.87–5.11)

## 2021-03-12 LAB — VITAMIN D 25 HYDROXY (VIT D DEFICIENCY, FRACTURES): Vit D, 25-Hydroxy: 67

## 2021-03-12 LAB — TSH: TSH: 2.12 (ref 0.41–5.90)

## 2021-03-15 ENCOUNTER — Other Ambulatory Visit: Payer: Self-pay | Admitting: Internal Medicine

## 2021-03-15 DIAGNOSIS — I10 Essential (primary) hypertension: Secondary | ICD-10-CM

## 2021-03-15 NOTE — Telephone Encounter (Signed)
Requested medications are due for refill today.  yes  Requested medications are on the active medications list.  yes  Last refill. 12/22/2020  Future visit scheduled.   no  Notes to clinic.  Pt was last seen in office 12/29/2020. Pt had last yearly CPE 04/05/2020. Pt cancelled upcoming OV for 04/06/2021. Please advise.    Requested Prescriptions  Pending Prescriptions Disp Refills   DILT-XR 180 MG 24 hr capsule [Pharmacy Med Name: Dilt-XR 180 MG Oral Capsule Extended Release 24 Hour] 90 capsule 0    Sig: Take 1 capsule by mouth once daily     Cardiovascular:  Calcium Channel Blockers Passed - 03/15/2021  6:32 PM      Passed - Last BP in normal range    BP Readings from Last 1 Encounters:  12/29/20 122/76          Passed - Valid encounter within last 6 months    Recent Outpatient Visits           2 months ago [facility] Glean Hess, MD   11 months ago Annual physical exam   [facility] Glean Hess, MD   1 year ago Annual physical exam   [facility] Glean Hess, MD   2 years ago Recurrent UTI   [facility] Glean Hess, MD   2 years ago Acute cystitis with hematuria   [facility] Glean Hess, MD

## 2021-03-28 ENCOUNTER — Encounter: Payer: Self-pay | Admitting: Emergency Medicine

## 2021-03-28 ENCOUNTER — Other Ambulatory Visit: Payer: Self-pay

## 2021-03-28 ENCOUNTER — Ambulatory Visit
Admission: EM | Admit: 2021-03-28 | Discharge: 2021-03-28 | Disposition: A | Discharge status: Home / Self Care | Payer: Self-pay | Attending: Emergency Medicine | Admitting: Emergency Medicine

## 2021-03-28 DIAGNOSIS — R300 Dysuria: Secondary | ICD-10-CM

## 2021-03-28 LAB — POCT URINALYSIS DIP (MANUAL ENTRY)
Bilirubin, UA: NEGATIVE
Glucose, UA: NEGATIVE mg/dL
Ketones, POC UA: NEGATIVE mg/dL
Nitrite, UA: NEGATIVE
Spec Grav, UA: 1.015 (ref 1.010–1.025)
Urobilinogen, UA: 0.2 E.U./dL
pH, UA: 6.5 (ref 5.0–8.0)

## 2021-03-28 MED ORDER — CEPHALEXIN 500 MG PO CAPS: 500.0000 mg | ORAL_CAPSULE | Freq: Two times a day (BID) | ORAL | 0 refills | Status: AC

## 2021-03-28 NOTE — ED Triage Notes (Signed)
Pt c/o dysuria and hematuria x 4 days.

## 2021-03-28 NOTE — Discharge Instructions (Addendum)
Take the antibiotic as directed.  Follow up with your primary care provider if your symptoms are not improving.     

## 2021-03-28 NOTE — ED Provider Notes (Signed)
[facility]    CSN: [id number] Arrival date & time: 03/28/21  0912      History   Chief Complaint Chief Complaint  Patient presents with   Dysuria   Hematuria    HPI Judith Wood is a 66 y.o. female.  Patient presents with 4-day history of dysuria and hematuria.  Patient states this is typical of previous UTIs.  No fever, chills, abdominal pain, flank pain, vaginal discharge, pelvic pain, or other symptoms.  No treatments at home.  Her medical history includes hypertension and atrial fibrillation.  The history is provided by the patient and medical records.   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      Home Medications    Prior to Admission medications   Medication Sig Start Date End Date Taking? Authorizing Provider  cephALEXin (KEFLEX) 500 MG capsule Take 1 capsule (500 mg total) by mouth 2 (two) times daily for 5 days. 03/28/21 04/02/21 Yes Sharion Balloon, NP  b complex vitamins tablet Take 1 tablet by mouth daily.    [provider]  Calcium Citrate-Vitamin D (CALCIUM CITRATE + D PO) Take 2 tablets by mouth daily.    [provider]  CHOLECALCIFEROL PO Take 5,000 Units by mouth daily.    [provider]  CRANBERRY EXTRACT PO Take 1 capsule by mouth  daily.    [provider]  D-MANNOSE PO Take 1 capsule by mouth daily.    [provider]  DILT-XR 180 MG 24 hr capsule Take 1 capsule by mouth once daily 03/16/21   Glean Hess, MD  Fe Bisgly-Vit C-Vit B12-FA (GENTLE IRON PO) Take 25 mg by mouth in the morning and at bedtime.    [provider]  flecainide (TAMBOCOR) 150 MG tablet Take 1 tablet (150 mg total) by mouth 2 (two) times daily as needed. 06/11/20   Minna Merritts, MD  glucosamine-chondroitin 500-400 MG tablet Take 1 tablet by mouth 3 (three) times daily.    [provider]  magnesium 30 MG tablet Take 30 mg by mouth 2 (two) times daily.    [provider]  metoprolol succinate (TOPROL-XL) 25 MG 24 hr tablet Take 1 tablet (25 mg total) by mouth daily. Take with or immediately following a meal. 10/18/20   Gollan, Kathlene November, MD  Probiotic Product (PROBIOTIC PO) Take 1 capsule by mouth daily.    [provider]  propranolol (INDERAL) 20 MG tablet Take 1 tablet (20 mg total) by mouth 3 (three) times daily as needed. Patient not taking: Reported on 12/29/2020 06/11/20   Minna Merritts, MD    Family History Family History  Problem Relation Age of Onset   Hypertension Mother    Arrhythmia Mother  Pacemaker implant    Heart attack Father 68   Arrhythmia Sister    Arrhythmia Brother    Arrhythmia Brother    Breast cancer Neg Hx     Social History Social History   Tobacco Use   Smoking status: Never   Smokeless tobacco: Never  Vaping Use   Vaping Use: Never used  Substance Use Topics   Alcohol use: No   Drug use: No     Allergies   Norco [hydrocodone-acetaminophen] and Tetracyclines & related   Review of Systems Review of Systems  Constitutional:  Negative for chills and fever.  Gastrointestinal:  Negative for abdominal pain and vomiting.  Genitourinary:  Positive for dysuria and hematuria. Negative for flank pain, pelvic pain and vaginal discharge.   Skin:  Negative for color change and rash.  All other systems reviewed and are negative.   Physical Exam Triage Vital Signs ED Triage Vitals  Enc Vitals Group     BP 03/28/21 1033 133/82     Pulse Rate 03/28/21 1033 63     Resp 03/28/21 1033 18     Temp 03/28/21 1033 98.2 F (36.8 C)     Temp src --      SpO2 03/28/21 1033 96 %     Weight --      Height --      Head Circumference --      Peak Flow --      Pain Score 03/28/21 1034 0     Pain Loc --      Pain Edu? --      Excl. in [place]? --    No data found.  Updated Vital Signs BP 133/82 (BP Location: Left Arm)    Pulse 63    Temp 98.2 F (36.8 C)    Resp 18    SpO2 96%   Visual Acuity Right Eye Distance:   Left Eye Distance:   Bilateral Distance:    Right Eye Near:   Left Eye Near:    Bilateral Near:     Physical Exam Vitals and nursing note reviewed.  Constitutional:      General: She is not in acute distress.    Appearance: She is well-developed. She is not ill-appearing.  Cardiovascular:     Rate and Rhythm: Normal rate and regular rhythm.     Heart sounds: Normal heart sounds.  Pulmonary:     Effort: Pulmonary effort is normal. No respiratory distress.     Breath sounds: Normal breath sounds.  Abdominal:     Palpations: Abdomen is soft.     Tenderness: There is no abdominal tenderness. There is no right CVA tenderness, left CVA tenderness, guarding or rebound.  Musculoskeletal:     Cervical back: Neck supple.  Skin:    General: Skin is warm and dry.  Neurological:     Mental Status: She is alert.  Psychiatric:        Mood and Affect: Mood normal.        Behavior: Behavior normal.     UC Treatments / Results  Labs (all labs ordered are listed, but only abnormal results are displayed) Labs Reviewed  POCT URINALYSIS DIP (MANUAL ENTRY) - Abnormal; Notable for the following components:      Result Value   Clarity, UA cloudy (*)    Blood, UA large (*)    Protein Ur, POC trace (*)    Leukocytes,  UA Moderate (2+) (*)    All other components within normal  limits    EKG   Radiology No results found.  Procedures Procedures (including critical care time)  Medications Ordered in UC Medications - No data to display  Initial Impression / Assessment and Plan / UC Course  I have reviewed the triage vital signs and the nursing notes.  Pertinent labs & imaging results that were available during my care of the patient were reviewed by me and considered in my medical decision making (see chart for details).   Dysuria.  Patient declines urine culture today; she is self-pay and concerned with cost.  Treating with Keflex.  Instructed her to follow-up with her PCP if her symptoms are not improving.  Patient agrees to plan of care.      Final Clinical Impressions(s) / UC Diagnoses   Final diagnoses:  Dysuria     Discharge Instructions      Take the antibiotic as directed.  Follow up with your primary care provider if your symptoms are not improving.           ED Prescriptions     Medication Sig Dispense Auth. Provider   cephALEXin (KEFLEX) 500 MG capsule Take 1 capsule (500 mg total) by mouth 2 (two) times daily for 5 days. 10 capsule Sharion Balloon, NP      PDMP not reviewed this encounter.   Sharion Balloon, NP 03/28/21 1057

## 2021-04-06 ENCOUNTER — Encounter: Payer: Self-pay | Admitting: Internal Medicine

## 2021-04-25 ENCOUNTER — Ambulatory Visit: Payer: Self-pay | Admitting: Cardiovascular Disease

## 2021-06-08 DIAGNOSIS — Z90710 Acquired absence of both cervix and uterus: Secondary | ICD-10-CM | POA: Insufficient documentation

## 2021-06-24 ENCOUNTER — Other Ambulatory Visit: Payer: Self-pay | Admitting: Internal Medicine

## 2021-06-24 DIAGNOSIS — I10 Essential (primary) hypertension: Secondary | ICD-10-CM

## 2021-06-27 NOTE — Telephone Encounter (Signed)
Requested Prescriptions  ?Pending Prescriptions Disp Refills  ?? DILT-XR 180 MG 24 hr capsule [Pharmacy Med Name: Dilt-XR 180 MG Oral Capsule Extended Release 24 Hour] 90 capsule 0  ?  Sig: Take 1 capsule by mouth once daily  ?  ? Cardiovascular: Calcium Channel Blockers 3 Failed - 06/24/2021  7:20 PM  ?  ?  Failed - AST in normal range and within 360 days  ?  AST  ?Date Value Ref Range Status  ?03/12/2021 36 (A) 13 - 35 Final  ?   ?  ?  Passed - ALT in normal range and within 360 days  ?  ALT  ?Date Value Ref Range Status  ?03/12/2021 26 7 - 35 Final  ?   ?  ?  Passed - Cr in normal range and within 360 days  ?  Creatinine  ?Date Value Ref Range Status  ?03/12/2021 0.8 0.5 - 1.1 Final  ? ?Creatinine, Ser  ?Date Value Ref Range Status  ?08/12/2020 0.71 0.44 - 1.00 mg/dL Final  ?   ?  ?  Passed - Last BP in normal range  ?  BP Readings from Last 1 Encounters:  ?03/28/21 133/82  ?   ?  ?  Passed - Last Heart Rate in normal range  ?  Pulse Readings from Last 1 Encounters:  ?03/28/21 63  ?   ?  ?  Passed - Valid encounter within last 6 months  ?  Recent Outpatient Visits   ?      ? 6 months ago Dysuria  ? [facility] Glean Hess, MD  ? 1 year ago Annual physical exam  ? [facility] Glean Hess, MD  ? 2 years ago Annual physical exam  ? [facility] Glean Hess, MD  ? 2 years ago Recurrent UTI  ? [facility] Glean Hess, MD  ? 3 years ago Acute cystitis with hematuria  ? [facility] Glean Hess, MD  ?  ?  ?Future Appointments   ?        ? In 2 months Army Melia Jesse Sans, MD [facility], [place]  ? In 2 months Gollan, Kathlene November, MD [facility]  ?  ? ?  ?  ?  ? ?

## 2021-09-07 ENCOUNTER — Encounter: Payer: Self-pay | Admitting: Internal Medicine

## 2021-09-07 ENCOUNTER — Ambulatory Visit (INDEPENDENT_AMBULATORY_CARE_PROVIDER_SITE_OTHER): Payer: Medicare Other | Admitting: Internal Medicine

## 2021-09-07 ENCOUNTER — Telehealth: Payer: Self-pay

## 2021-09-07 VITALS — BP 142/82 | HR 64 | Resp 12 | Ht 65.0 in | Wt 251.0 lb

## 2021-09-07 DIAGNOSIS — E782 Mixed hyperlipidemia: Secondary | ICD-10-CM

## 2021-09-07 DIAGNOSIS — Z6841 Body Mass Index (BMI) 40.0 and over, adult: Secondary | ICD-10-CM

## 2021-09-07 DIAGNOSIS — I10 Essential (primary) hypertension: Secondary | ICD-10-CM

## 2021-09-07 DIAGNOSIS — I480 Paroxysmal atrial fibrillation: Secondary | ICD-10-CM

## 2021-09-07 DIAGNOSIS — R739 Hyperglycemia, unspecified: Secondary | ICD-10-CM

## 2021-09-07 DIAGNOSIS — N3281 Overactive bladder: Secondary | ICD-10-CM

## 2021-09-07 DIAGNOSIS — Z1231 Encounter for screening mammogram for malignant neoplasm of breast: Secondary | ICD-10-CM | POA: Diagnosis not present

## 2021-09-07 DIAGNOSIS — E66813 Obesity, class 3: Secondary | ICD-10-CM | POA: Insufficient documentation

## 2021-09-07 DIAGNOSIS — Z1211 Encounter for screening for malignant neoplasm of colon: Secondary | ICD-10-CM

## 2021-09-07 DIAGNOSIS — Z23 Encounter for immunization: Secondary | ICD-10-CM

## 2021-09-07 MED ORDER — OXYBUTYNIN CHLORIDE ER 5 MG PO TB24: 5.0000 mg | ORAL_TABLET | Freq: Every day | ORAL | 0 refills | Status: DC

## 2021-09-07 MED ORDER — DILTIAZEM HCL ER 180 MG PO CP24: 180.0000 mg | ORAL_CAPSULE | Freq: Every day | ORAL | 1 refills | Status: DC

## 2021-09-07 MED ORDER — SHINGRIX 50 MCG/0.5ML IM SUSR: 0.5000 mL | Freq: Once | INTRAMUSCULAR | 1 refills | Status: AC

## 2021-09-07 NOTE — Telephone Encounter (Signed)
CALLED PATIENT NO ANSWER LEFT VOICEMAIL FOR A CALL BACK ? ?

## 2021-09-07 NOTE — Patient Instructions (Signed)
After age 65 you are due due for Prevnar-20.

## 2021-09-07 NOTE — Progress Notes (Signed)
Date:  09/07/2021   Name:  Judith Wood   DOB:  05/16/1955   MRN:  [id number]   Chief Complaint: Annual Exam Judith Wood is a 66 y.o. female who presents today for her Complete Annual Exam. She feels well. She reports exercising - one. She reports she is sleeping well. Breast complaints - none .  Mammogram: 03/2019 [place] DEXA: none Pap smear: 03/2017 neg thin prep Colonoscopy: FIT neg 03/2020  Health Maintenance Due  Topic Date Due   Zoster Vaccines- Shingrix (1 of 2) Never done   COLONOSCOPY (Pts 45-46yr Insurance coverage will need to be confirmed)  01/17/2017   MAMMOGRAM  03/31/2020    Immunization History  Administered Date(s) Administered   PFIZER Comirnaty([place])Covid-19 Tri-Sucrose Vaccine 06/25/2019, 07/17/2019   Tdap 09/09/2020    Hypertension This is a chronic problem. The problem is controlled. Associated symptoms include palpitations (afib several times a month). Pertinent negatives include no chest pain, headaches or shortness of breath. Past treatments include beta blockers and calcium channel blockers. The current treatment provides significant improvement. There are no compliance problems.  Hypertensive end-organ damage includes CAD/MI (Afib). There is no history of kidney disease or CVA.  Afib - has an episode about 1-2 times per month.  Takes Flecanide as needed. OAB - wearing a pad constantly due to leakage with occasional larger volume leakage when her bladder is full.  No UTI symptoms.  Has never taken any medication for this problem. Hypoglycemia - she has noticed more issues with low blood sugar after eating sweets.  This has been an issues for years since her gastric bypass but recently a bit more pronounced.  She is able to eat a snack and the symptoms resolve.  The lowest BS has been 54.  The highest has been 125 when she is not symptomatic.  Lab Results  Component Value Date   NA 136 (A) 03/12/2021   K 4.7 03/12/2021   CO2 26 (A) 03/12/2021    GLUCOSE 97 08/12/2020   BUN 20 03/12/2021   CREATININE 0.8 03/12/2021   CALCIUM 9.3 03/12/2021   GFRNONAA 78 03/12/2021   Lab Results  Component Value Date   CHOL 160 03/12/2021   HDL 45 03/12/2021   LDLCALC 91 03/12/2021   TRIG 141 03/12/2021   Lab Results  Component Value Date   TSH 2.12 03/12/2021   No results found for: "HGBA1C" Lab Results  Component Value Date   WBC 4.6 03/12/2021   HGB 12.1 03/12/2021   HCT 39 03/12/2021   MCV 74.3 (L) 08/12/2020   PLT 383 03/12/2021   Lab Results  Component Value Date   ALT 26 03/12/2021   AST 36 (A) 03/12/2021   ALKPHOS 72 03/12/2021   BILITOT 0.7 08/12/2020   Lab Results  Component Value Date   VD25OH 67 03/12/2021     Review of Systems  Constitutional:  Negative for chills, fatigue, fever and unexpected weight change (planning to resume [employer].  HENT:  Negative for congestion, hearing loss, tinnitus, trouble swallowing and voice change.   Eyes:  Negative for visual disturbance.  Respiratory:  Negative for cough, chest tightness, shortness of breath and wheezing.   Cardiovascular:  Positive for palpitations (afib several times a month). Negative for chest pain and leg swelling.  Gastrointestinal:  Negative for abdominal pain, blood in stool, constipation, diarrhea and vomiting.  Endocrine: Negative for polydipsia and polyuria.  Genitourinary:  Positive for frequency and urgency. Negative for dysuria, genital sores, vaginal bleeding  and vaginal discharge.  Musculoskeletal:  Negative for arthralgias, gait problem and joint swelling.  Skin:  Negative for color change and rash.  Neurological:  Negative for dizziness, tremors, light-headedness and headaches.  Hematological:  Negative for adenopathy. Does not bruise/bleed easily.  Psychiatric/Behavioral:  Negative for dysphoric mood and sleep disturbance. The patient is not nervous/anxious.     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  Current Meds  Medication Sig   b complex vitamins tablet Take 1 tablet by mouth daily.   Calcium Citrate-Vitamin D (CALCIUM CITRATE + D PO) Take 2 tablets by mouth daily.   CHOLECALCIFEROL PO Take 5,000 Units by mouth daily.   D-MANNOSE PO Take 1 capsule by mouth daily.   DILT-XR 180 MG 24 hr capsule Take 1 capsule by mouth once daily   Fe Bisgly-Vit C-Vit B12-FA (GENTLE IRON PO) Take 25 mg by mouth in the morning and at bedtime.   flecainide (TAMBOCOR) 150 MG tablet Take 1 tablet (150 mg total) by mouth 2 (two) times daily as needed.   glucosamine-chondroitin 500-400 MG tablet Take 1 tablet by mouth 3 (three) times daily.   magnesium 30 MG tablet Take 30 mg by mouth 2 (two) times daily.   metoprolol succinate (TOPROL-XL) 25 MG 24 hr tablet Take 1 tablet (25 mg total) by mouth daily. Take with or immediately following a meal.   oxybutynin (DITROPAN XL) 5 MG 24 hr tablet Take 1 tablet (5 mg total) by mouth at bedtime.   Zoster Vaccine Adjuvanted [facility]) injection Inject 0.5 mLs into the muscle once for 1  dose.       09/07/2021   10:51 AM 12/29/2020    1:52 PM 04/05/2020    8:43 AM  GAD 7 : Generalized Anxiety Score  Nervous, Anxious, on Edge 0 0 0  Control/stop worrying 0 0 0  Worry too much - different things 0 0 0  Trouble relaxing 0 0 0  Restless 0 0 0  Easily annoyed or irritable 0 0 0  Afraid - awful might happen 0 0 0  Total GAD 7 Score 0 0 0  Anxiety Difficulty Not difficult at all Not difficult at all        09/07/2021   10:51 AM 12/29/2020    1:52 PM 04/05/2020    8:43 AM  Depression screen PHQ 2/9  Decreased Interest 1 1 0  Down, Depressed, Hopeless 1 0 0  PHQ - 2 Score 2 1 0  Altered  sleeping 1 0 0  Tired, decreased energy 1 1 0  Change in appetite 0 1 0  Feeling bad or failure about yourself  0 0 0  Trouble concentrating 0 0 0  Moving slowly or fidgety/restless 0 0 0  Suicidal thoughts 0 0 0  PHQ-9 Score 4 3 0  Difficult doing work/chores Not difficult at all Not difficult at all     BP Readings from Last 3 Encounters:  09/07/21 (!) 142/82  03/28/21 133/82  12/29/20 122/76    Physical Exam Vitals and nursing note reviewed.  Constitutional:      General: She is not in acute distress.    Appearance: She is well-developed.  HENT:     Head: Normocephalic and atraumatic.     Right Ear: Tympanic membrane and ear canal normal.     Left Ear: Tympanic membrane and ear canal normal.     Nose:     Right Sinus: No maxillary sinus tenderness.     Left Sinus: No maxillary sinus tenderness.  Eyes:     General: No scleral icterus.       Right eye: No discharge.        Left eye: No discharge.     Conjunctiva/sclera: Conjunctivae normal.  Neck:     Thyroid: No thyromegaly.     Vascular: No carotid bruit.  Cardiovascular:     Rate and Rhythm: Normal rate and regular rhythm.     Pulses: Normal pulses.     Heart sounds: Normal heart sounds.  Pulmonary:     Effort: Pulmonary effort is normal. No respiratory distress.     Breath sounds: No wheezing.  Chest:   Breasts:    Right: No mass, nipple discharge, skin change or tenderness.     Left: No mass, nipple discharge, skin change or tenderness.  Abdominal:     General: Bowel sounds are normal.     Palpations: Abdomen is soft.     Tenderness: There is no abdominal tenderness.  Musculoskeletal:     Cervical back: Normal range of motion. No erythema.     Right lower leg: No edema.     Left lower leg: No edema.  Lymphadenopathy:     Cervical: No cervical adenopathy.  Skin:    General: Skin is warm and dry.     Findings: No rash.  Neurological:     Mental Status: She is alert and oriented to person, place, and time.     Cranial Nerves: No cranial nerve deficit.     Sensory: No sensory deficit.     Deep Tendon Reflexes: Reflexes are normal and symmetric.  Psychiatric:        Attention and Perception: Attention normal.        Mood and Affect: Mood normal.     Wt Readings from Last 3 Encounters:  09/07/21 251 lb (113.9 kg)  12/29/20 244 lb 12.8 oz (111 kg)  10/18/20 242 lb 8 oz (110 kg)    BP (!) 142/82 (BP Location: Left Arm, Cuff Size: Large)   Pulse 64   Resp 12   Ht '5\' 5"'$  (1.651 m)   Wt 251 lb (113.9 kg)   SpO2 95%   BMI 41.77 kg/m   Assessment and Plan: 1. Benign essential HTN BP fairly well controlled on metoprolol and Diltiazem. Continue diet efforts and low sodium Exercise as able - CBC with Differential/Platelet - Comprehensive metabolic panel - TSH - Urinalysis, Routine w reflex microscopic - diltiazem (DILT-XR) 180 MG  24 hr capsule; Take 1 capsule (180 mg total) by mouth daily.  Dispense: 90 capsule; Refill: 1  2. Mixed hyperlipidemia Check labs and advise - Lipid panel  3. Encounter for screening mammogram for breast cancer [facility] - MM 3D SCREEN BREAST BILATERAL  4. Colon cancer screening Refer for colonoscopy - Ambulatory referral to Gastroenterology  5. BMI 40.0-44.9, adult ([place]) [place] planned  6. Paroxysmal atrial fibrillation  (HCC) Intermittent symptoms - not on DOAC Uses Flecanide PRN Seeing cardiology later this month  7. OAB (overactive bladder) Trial of ditropan - oxybutynin (DITROPAN XL) 5 MG 24 hr tablet; Take 1 tablet (5 mg total) by mouth at bedtime.  Dispense: 30 tablet; Refill: 0  8. Hyperglycemia Continue dietary modifications to manage symptoms - Hemoglobin A1c  9. Need for shingles vaccine Obtain from local pharmacy if desired - Zoster Vaccine Adjuvanted [facility]) injection; Inject 0.5 mLs into the muscle once for 1 dose.  Dispense: 0.5 mL; Refill: 1  Patient will be due for Prevnar-20 in a few days at age 17.  She can obtain this here or at her local pharmacy.  Partially dictated using Editor, commissioning. Any errors are unintentional.  Halina Maidens, MD [facility]  09/07/2021

## 2021-09-08 ENCOUNTER — Other Ambulatory Visit: Payer: Self-pay

## 2021-09-08 DIAGNOSIS — Z86010 Personal history of colonic polyps: Secondary | ICD-10-CM

## 2021-09-08 MED ORDER — NA SULFATE-K SULFATE-MG SULF 17.5-3.13-1.6 GM/177ML PO SOLN: 1.0000 | Freq: Once | ORAL | 0 refills | Status: AC

## 2021-09-08 NOTE — Progress Notes (Signed)
Gastroenterology Pre-Procedure Review  Request Date: 09/29/2021 Requesting Physician: Dr. Vicente Males  PATIENT REVIEW QUESTIONS: The patient responded to the following health history questions as indicated:    1. Are you having any GI issues? no 2. Do you have a personal history of Polyps? yes (15 years ago ) 3. Do you have a family history of Colon Cancer or Polyps? no 4. Diabetes Mellitus? no 5. Joint replacements in the past 12 months?no 6. Major health problems in the past 3 months?no 7. Any artificial heart valves, MVP, or defibrillator?no    MEDICATIONS & ALLERGIES:    Patient reports the following regarding taking any anticoagulation/antiplatelet therapy:   Plavix, Coumadin, Eliquis, Xarelto, Lovenox, Pradaxa, Brilinta, or Effient? no Aspirin? no  Patient confirms/reports the following medications:  Current Outpatient Medications  Medication Sig Dispense Refill   b complex vitamins tablet Take 1 tablet by mouth daily.     Calcium Citrate-Vitamin D (CALCIUM CITRATE + D PO) Take 2 tablets by mouth daily.     CHOLECALCIFEROL PO Take 5,000 Units by mouth daily.     D-MANNOSE PO Take 1 capsule by mouth daily.     DILT-XR 180 MG 24 hr capsule Take 1 capsule by mouth once daily 90 capsule 0   diltiazem (DILT-XR) 180 MG 24 hr capsule Take 1 capsule (180 mg total) by mouth daily. 90 capsule 1   Fe Bisgly-Vit C-Vit B12-FA (GENTLE IRON PO) Take 25 mg by mouth in the morning and at bedtime.     flecainide (TAMBOCOR) 150 MG tablet Take 1 tablet (150 mg total) by mouth 2 (two) times daily as needed. 30 tablet 1   glucosamine-chondroitin 500-400 MG tablet Take 1 tablet by mouth 3 (three) times daily.     magnesium 30 MG tablet Take 30 mg by mouth 2 (two) times daily.     metoprolol succinate (TOPROL-XL) 25 MG 24 hr tablet Take 1 tablet (25 mg total) by mouth daily. Take with or immediately following a meal. 90 tablet 4   oxybutynin (DITROPAN XL) 5 MG 24 hr tablet Take 1 tablet (5 mg total) by  mouth at bedtime. 30 tablet 0   No current facility-administered medications for this visit.    Patient confirms/reports the following allergies:  Allergies  Allergen Reactions   Norco [Hydrocodone-Acetaminophen] Itching   Tetracyclines & Related Itching and Rash    No orders of the defined types were placed in this encounter.   AUTHORIZATION INFORMATION Primary Insurance: 1D#: Group #:  Secondary Insurance: 1D#: Group #:  SCHEDULE INFORMATION: Date: 09/29/2021 Time: Location: [facility]

## 2021-09-08 NOTE — Progress Notes (Signed)
Cardiology Office Note  Date:  09/09/2021   ID:  Judith Wood, DOB [birth year], MRN [id number]  PCP:  Glean Hess, MD   Chief Complaint  Patient presents with   discuss watchman procedure     HPI:  [name] is a 66 year old woman with past medical history of paroxysmal atrial fibrillation by outside cardiologist [place] Hyperlipidemia Hypertension History of smoking A. Fib, CHADS VASC 2, not on anticoagulation secondary to no insurance Who presents for follow-up of her paroxysmal atrial fibrillation, recent COVID infection  Last seen in clinic August 2022 Continues to have rare palpitations concerning for atrial fibrillation Comfortable with her current regiment of taking diltiazem ER and metoprolol daily with flecainide PRN for palpitations, "heart beating out of chest" Rate >100-125 when she has episodes Has a watch that monitors heart rate  Going to weight watchers Trying to do exercise  Previously declined anticoagulation Now has Medicare, interested in starting a NOAC Questions concerning Watchman device  EKG personally reviewed by myself on todays visit Normal sinus rhythm rate 61 bpm no significant ST-T wave changes  Other past medical history reviewed ZIO monitor reviewed Patient had a min HR of 46 bpm, max HR of 175 bpm, and avg HR of 65 bpm.  Predominant underlying rhythm was Sinus Rhythm.    1 run of Ventricular Tachycardia occurred lasting 5 beats with a max rate of 135 bpm (avg 127 bpm).    Atrial Fibrillation/Flutter occurred (2% burden), ranging from 50-175 bpm (avg of 74 bpm), the longest lasting 6 hours 9 mins with an avg rate of 74 bpm.  Atrial Fibrillation/Flutter was detected within +/- 45 seconds of symptomatic patient event(s).    Isolated SVEs were rare (<1.0%),SVE Couplets were rare (<1.0%), and SVE Triplets were rare (<1.0%). Isolated VEs were rare (<1.0%, 874), VE Couplets were rare (<1.0%, 7), and VE Triplets were rare (<1.0%,  1). Ventricular Trigeminy was present.    COVID end of May 2022, worsening symptoms into June Went to the ER August 12, 2020 EKG showed normal sinus rhythm  Echocardiogram August 2019 NORMAL LEFT VENTRICULAR SYSTOLIC FUNCTION   WITH MILD LVH  NORMAL RIGHT VENTRICULAR SYSTOLIC FUNCTION  MILD VALVULAR REGURGITATION (See above)  NO VALVULAR STENOSIS  TRIVIAL MR  MILD TR  EF 50%   Outside stress test August 2019, treadmill only  PMH:   has a past medical history of Abnormal mammogram of right breast (01/18/2016), Afib ([place]), and Hypertension.  PSH:    Past Surgical History:  Procedure Laterality Date   CHOLECYSTECTOMY     ROUX-EN-Y GASTRIC BYPASS  2009   TOTAL ABDOMINAL HYSTERECTOMY  2000   WRIST SURGERY      Current Outpatient Medications  Medication Sig Dispense Refill   apixaban (ELIQUIS) 5 MG TABS tablet Take 1 tablet (5 mg total) by mouth 2 (two) times daily. 60 tablet 3   b complex vitamins tablet Take 1 tablet by mouth daily.     Calcium Citrate-Vitamin D (CALCIUM CITRATE + D PO) Take 2 tablets by mouth daily.     CHOLECALCIFEROL PO Take 5,000 Units by mouth daily.     D-MANNOSE PO Take 1 capsule by mouth daily.     DILT-XR 180 MG 24 hr capsule Take 1 capsule by mouth once daily (Patient taking differently: Take 180 mg by mouth daily.) 90 capsule 0   diltiazem (DILT-XR) 180 MG 24 hr capsule Take 1 capsule (180 mg total) by mouth daily. 90 capsule 1   Fe Bisgly-Vit  C-Vit B12-FA (GENTLE IRON PO) Take 25 mg by mouth in the morning and at bedtime.     flecainide (TAMBOCOR) 150 MG tablet Take 1 tablet (150 mg total) by mouth 2 (two) times daily as needed. 30 tablet 1   glucosamine-chondroitin 500-400 MG tablet Take 1 tablet by mouth 3 (three) times daily.     magnesium 30 MG tablet Take 30 mg by mouth 2 (two) times daily.     oxybutynin (DITROPAN XL) 5 MG 24 hr tablet Take 1 tablet (5 mg total) by mouth at bedtime. 30 tablet 0   metoprolol succinate (TOPROL-XL) 25 MG 24 hr tablet  Take 1 tablet (25 mg total) by mouth daily. Take with or immediately following a meal. 90 tablet 3   No current facility-administered medications for this visit.    Allergies:   Norco [hydrocodone-acetaminophen] and Tetracyclines & related   Social History:  The patient  reports that she has never smoked. She has never used smokeless tobacco. She reports that she does not drink alcohol and does not use drugs.   Family History:   family history includes Arrhythmia in her brother, brother, mother, and sister; Heart attack (age of onset: 37) in her father; Hypertension in her mother.    Review of Systems: Review of Systems  Constitutional: Negative.   HENT: Negative.    Respiratory: Negative.    Cardiovascular:  Positive for palpitations.  Gastrointestinal: Negative.   Musculoskeletal: Negative.   Neurological: Negative.   Psychiatric/Behavioral: Negative.    All other systems reviewed and are negative.   PHYSICAL EXAM: VS:  BP 112/72 (BP Location: Left Arm, Patient Position: Sitting)   Pulse 61   Ht '5\' 5"'$  (1.651 m)   Wt 249 lb 12.8 oz (113.3 kg)   SpO2 94%   BMI 41.57 kg/m  , BMI Body mass index is 41.57 kg/m. Constitutional:  oriented to person, place, and time. No distress.  HENT:  Head: Grossly normal Eyes:  no discharge. No scleral icterus.  Neck: No JVD, no carotid bruits  Cardiovascular: Regular rate and rhythm, no murmurs appreciated Pulmonary/Chest: Clear to auscultation bilaterally, no wheezes or rails Abdominal: Soft.  no distension.  no tenderness.  Musculoskeletal: Normal range of motion Neurological:  normal muscle tone. Coordination normal. No atrophy Skin: Skin warm and dry Psychiatric: normal affect, pleasant  Recent Labs: 03/12/2021: ALT 26; BUN 20; Creatinine 0.8; Hemoglobin 12.1; Platelets 383; Potassium 4.7; Sodium 136; TSH 2.12    Lipid Panel Lab Results  Component Value Date   CHOL 160 03/12/2021   HDL 45 03/12/2021   LDLCALC 91 03/12/2021    TRIG 141 03/12/2021      Wt Readings from Last 3 Encounters:  09/09/21 249 lb 12.8 oz (113.3 kg)  09/07/21 251 lb (113.9 kg)  12/29/20 244 lb 12.8 oz (111 kg)     ASSESSMENT AND PLAN:  Atrial fibrillation, paroxysmal Continues to have paroxysmal episodes of atrial fibrillation Monitor reviewed showing 2% burden atrial fibrillation, frequent paroxysmal episodes Discussed CHADS VASC 2 She is willing to start Eliquis 5 twice daily, prescription initiated Continue current dose of diltiazem ER and metoprolol with flecainide as needed For increasing frequency of episodes may need to take flecainide on a regular basis  HTN: Blood pressure is well controlled on today's visit. No changes made to the medications.    Total encounter time more than 30 minutes  Greater than 50% was spent in counseling and coordination of care with the patient    Signed,  Esmond Plants, M.D., Ph.D. [place], [place]

## 2021-09-09 ENCOUNTER — Encounter: Payer: Self-pay | Admitting: Cardiovascular Disease

## 2021-09-09 ENCOUNTER — Ambulatory Visit (INDEPENDENT_AMBULATORY_CARE_PROVIDER_SITE_OTHER): Payer: Medicare Other | Admitting: Cardiovascular Disease

## 2021-09-09 VITALS — BP 112/72 | HR 61 | Ht 65.0 in | Wt 249.8 lb

## 2021-09-09 DIAGNOSIS — E782 Mixed hyperlipidemia: Secondary | ICD-10-CM

## 2021-09-09 DIAGNOSIS — I10 Essential (primary) hypertension: Secondary | ICD-10-CM | POA: Diagnosis not present

## 2021-09-09 DIAGNOSIS — I480 Paroxysmal atrial fibrillation: Secondary | ICD-10-CM

## 2021-09-09 MED ORDER — APIXABAN 5 MG PO TABS: 5.0000 mg | ORAL_TABLET | Freq: Two times a day (BID) | ORAL | 3 refills | Status: DC

## 2021-09-09 MED ORDER — METOPROLOL SUCCINATE ER 25 MG PO TB24: 25.0000 mg | ORAL_TABLET | Freq: Every day | ORAL | 3 refills | Status: DC

## 2021-09-09 MED ORDER — FLECAINIDE ACETATE 150 MG PO TABS: 150.0000 mg | ORAL_TABLET | Freq: Two times a day (BID) | ORAL | 3 refills | Status: DC | PRN

## 2021-09-09 NOTE — Patient Instructions (Addendum)
Medication Instructions:  Please start eliquis 5 mg twice a day  If you need a refill on your cardiac medications before your next appointment, please call your pharmacy.   Lab work: No new labs needed  Testing/Procedures: No new testing needed  Follow-Up: At [facility], you and your health needs are our priority.  As part of our continuing mission to provide you with exceptional heart care, we have created designated Provider Care Teams.  These Care Teams include your primary Cardiologist (physician) and Advanced Practice Providers (APPs -  Physician Assistants and Nurse Practitioners) who all work together to provide you with the care you need, when you need it.  You will need a follow up appointment in 12 months  Providers on your designated Care Team:   Murray Hodgkins, NP Christell Faith, PA-C Cadence Kathlen Mody, [place]  COVID-19 Vaccine Information can be found at: [email or web address] For questions related to vaccine distribution or appointments, please email [email or web address] or call [phone].

## 2021-09-10 ENCOUNTER — Ambulatory Visit
Admission: EM | Admit: 2021-09-10 | Discharge: 2021-09-10 | Disposition: A | Discharge status: Home / Self Care | Payer: Medicare Other | Attending: Emergency Medicine | Admitting: Emergency Medicine

## 2021-09-10 ENCOUNTER — Encounter: Payer: Self-pay | Admitting: Emergency Medicine

## 2021-09-10 DIAGNOSIS — N3000 Acute cystitis without hematuria: Secondary | ICD-10-CM | POA: Insufficient documentation

## 2021-09-10 LAB — URINALYSIS, MICROSCOPIC (REFLEX)
RBC / HPF: >50 RBC/hpf (ref 0–5)
WBC, UA: >50 WBC/hpf (ref 0–5)

## 2021-09-10 LAB — URINALYSIS, ROUTINE W REFLEX MICROSCOPIC
Bilirubin Urine: NEGATIVE
Glucose, UA: NEGATIVE mg/dL
Ketones, ur: NEGATIVE mg/dL
Nitrite: NEGATIVE
Protein, ur: 100 mg/dL — AB
Specific Gravity, Urine: 1.015 (ref 1.005–1.030)
pH: 6 (ref 5.0–8.0)

## 2021-09-10 MED ORDER — CEPHALEXIN 500 MG PO CAPS: 500.0000 mg | ORAL_CAPSULE | Freq: Two times a day (BID) | ORAL | 0 refills | Status: AC

## 2021-09-10 NOTE — ED Provider Notes (Signed)
[facility]    CSN: [id number] Arrival date & time: 09/10/21  0858      History   Chief Complaint Chief Complaint  Patient presents with   Dysuria    HPI Judith Wood is a 66 y.o. female.   Patient presents with dysuria and urinary frequency beginning 1 day ago.  Has attempted use of a vitamin which was ineffective.  Denies hematuria, lower abdominal pain or pressure, flank pain, fever, chills or vaginal symptoms.  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      Home Medications    Prior to Admission medications   Medication Sig Start Date End Date Taking? Authorizing Provider  apixaban (ELIQUIS) 5 MG TABS tablet Take 1 tablet (5 mg total) by mouth 2 (two) times daily. 09/09/21   Minna Merritts, MD  b complex vitamins tablet Take 1 tablet by mouth daily.    [provider]  Calcium Citrate-Vitamin D (CALCIUM CITRATE + D PO) Take 2 tablets by mouth daily.    [provider]  CHOLECALCIFEROL PO Take 5,000 Units by mouth daily.    [provider]  D-MANNOSE PO Take 1 capsule by mouth daily.    [provider]  DILT-XR 180 MG 24 hr capsule Take 1 capsule by mouth once daily Patient taking  differently: Take 180 mg by mouth daily. 06/27/21   Glean Hess, MD  diltiazem (DILT-XR) 180 MG 24 hr capsule Take 1 capsule (180 mg total) by mouth daily. 09/07/21   Glean Hess, MD  Fe Bisgly-Vit C-Vit B12-FA (GENTLE IRON PO) Take 25 mg by mouth in the morning and at bedtime.    [provider]  flecainide (TAMBOCOR) 150 MG tablet Take 1 tablet (150 mg total) by mouth 2 (two) times daily as needed. 09/09/21   Minna Merritts, MD  glucosamine-chondroitin 500-400 MG tablet Take 1 tablet by mouth 3 (three) times daily.    [provider]  magnesium 30 MG tablet Take 30 mg by mouth 2 (two) times daily.    [provider]  metoprolol succinate (TOPROL-XL) 25 MG 24 hr tablet Take 1 tablet (25 mg total) by mouth daily. Take with or immediately following a meal. 09/09/21   Gollan, Kathlene November, MD  oxybutynin (DITROPAN XL) 5 MG 24 hr tablet Take 1 tablet (5 mg total) by mouth at bedtime. 09/07/21   Glean Hess, MD    Family History Family History  Problem Relation Age of Onset   Hypertension Mother    Arrhythmia Mother        Pacemaker implant    Heart attack Father 91   Arrhythmia Sister    Arrhythmia Brother  Arrhythmia Brother    Breast cancer Neg Hx     Social History Social History   Tobacco Use   Smoking status: Never   Smokeless tobacco: Never  Vaping Use   Vaping Use: Never used  Substance Use Topics   Alcohol use: No   Drug use: No     Allergies   Norco [hydrocodone-acetaminophen] and Tetracyclines & related   Review of Systems Review of Systems  Constitutional: Negative.   Respiratory: Negative.    Cardiovascular: Negative.   Genitourinary:  Positive for dysuria and frequency. Negative for decreased urine volume, difficulty urinating, dyspareunia, enuresis, flank pain, genital sores, hematuria, menstrual problem, pelvic pain, urgency, vaginal bleeding, vaginal discharge and vaginal pain.     Physical Exam Triage Vital  Signs ED Triage Vitals  Enc Vitals Group     BP 09/10/21 0913 139/78     Pulse Rate 09/10/21 0913 60     Resp 09/10/21 0913 14     Temp 09/10/21 0913 98.2 F (36.8 C)     Temp Source 09/10/21 0913 Oral     SpO2 09/10/21 0913 99 %     Weight 09/10/21 0911 249 lb 12.5 oz (113.3 kg)     Height 09/10/21 0911 '5\' 5"'$  (1.651 m)     Head Circumference --      Peak Flow --      Pain Score 09/10/21 0911 8     Pain Loc --      Pain Edu? --      Excl. in [place]? --    No data found.  Updated Vital Signs BP 139/78 (BP Location: Left Arm)   Pulse 60   Temp 98.2 F (36.8 C) (Oral)   Resp 14   Ht '5\' 5"'$  (1.651 m)   Wt 249 lb 12.5 oz (113.3 kg)   SpO2 99%   BMI 41.57 kg/m   Visual Acuity Right Eye Distance:   Left Eye Distance:   Bilateral Distance:    Right Eye Near:   Left Eye Near:    Bilateral Near:     Physical Exam Constitutional:      Appearance: Normal appearance.  HENT:     Head: Normocephalic.  Eyes:     Extraocular Movements: Extraocular movements intact.  Pulmonary:     Effort: Pulmonary effort is normal.  Abdominal:     General: Abdomen is flat. Bowel sounds are normal. There is no distension.     Palpations: Abdomen is soft.     Tenderness: There is no abdominal tenderness. There is no right CVA tenderness, left CVA tenderness or guarding.  Neurological:     Mental Status: She is alert and oriented to person, place, and time. Mental status is at baseline.  Psychiatric:        Mood and Affect: Mood normal.        Behavior: Behavior normal.      UC Treatments / Results  Labs (all labs ordered are listed, but only abnormal results are displayed) Labs Reviewed  URINALYSIS, [place] MICROSCOPIC    EKG   Radiology No results found.  Procedures Procedures (including critical care time)  Medications Ordered in UC Medications - No data to display  Initial Impression / Assessment and Plan / UC Course  I have reviewed the triage vital signs and  the nursing notes.  Pertinent labs & imaging results that were available during my care of the patient were reviewed by me and considered in my medical decision  making (see chart for details).  Acute cystitis without hematuria  Vital signs are stable and patient is in no signs of distress, urinalysis showing [name] blood cells and bacteria underneath the microscope, sent for culture, discussed findings with patient, Keflex 5-day course prescribed, may use over-the-counter Pyridium, Tylenol or ibuprofen for management of discomfort, advised increase fluid intake and good hygiene for additional support, given strict precautions for persisting or reoccurring symptoms to follow-up with urgent care or primary doctor for reevaluation Final Clinical Impressions(s) / UC Diagnoses   Final diagnoses:  None   Discharge Instructions   None    ED Prescriptions   None    PDMP not reviewed this encounter.   Hans [place], [place] 09/10/21 [phone]

## 2021-09-10 NOTE — ED Triage Notes (Signed)
Patient c/o burning when urinating and urinary frequency that started yesterday.  Patient denies fevers.

## 2021-09-10 NOTE — Discharge Instructions (Addendum)
Your urinalysis shows Judith Wood blood cells and nitrates which are indicative of infection, your urine will be sent to the lab to determine exactly which bacteria is present, if any changes need to be made to your medications you will be notified  Begin use of keflex every morning and every evening for 5 days   You may use over-the-counter Azo to help minimize your symptoms until antibiotic removes bacteria, this medication will turn your urine orange  Increase your fluid intake through use of water  As always practice good hygiene, wiping front to back and avoidance of scented vaginal products to prevent further irritation  If symptoms continue to persist after use of medication or recur please follow-up with urgent care or your primary doctor as needed  

## 2021-09-12 LAB — URINE CULTURE: Culture: <10000 — AB

## 2021-09-13 LAB — MICROSCOPIC EXAMINATION
Bacteria, UA: NONE SEEN
Casts: NONE SEEN /lpf

## 2021-09-13 LAB — CBC WITH DIFFERENTIAL/PLATELET
Basophils Absolute: 0 10*3/uL (ref 0.0–0.2)
Basos: 0 %
EOS (ABSOLUTE): 0 10*3/uL (ref 0.0–0.4)
Eos: 0 %
Hematocrit: 38.5 % (ref 34.0–46.6)
Hemoglobin: 12.2 g/dL (ref 11.1–15.9)
Immature Grans (Abs): 0 10*3/uL (ref 0.0–0.1)
Immature Granulocytes: 0 %
Lymphocytes Absolute: 1.8 10*3/uL (ref 0.7–3.1)
Lymphs: 36 %
MCH: 26.2 pg — ABNORMAL LOW (ref 26.6–33.0)
MCHC: 31.7 g/dL (ref 31.5–35.7)
MCV: 83 fL (ref 79–97)
Monocytes Absolute: 0.6 10*3/uL (ref 0.1–0.9)
Monocytes: 11 %
Neutrophils Absolute: 2.6 10*3/uL (ref 1.4–7.0)
Neutrophils: 53 %
Platelets: 344 10*3/uL (ref 150–450)
RBC: 4.66 x10E6/uL (ref 3.77–5.28)
RDW: 14.1 % (ref 11.7–15.4)
WBC: 5 10*3/uL (ref 3.4–10.8)

## 2021-09-13 LAB — URINALYSIS, ROUTINE W REFLEX MICROSCOPIC
Bilirubin, UA: NEGATIVE
Glucose, UA: NEGATIVE
Ketones, UA: NEGATIVE
Nitrite, UA: NEGATIVE
Protein,UA: NEGATIVE
RBC, UA: NEGATIVE
Specific Gravity, UA: 1.016 (ref 1.005–1.030)
Urobilinogen, Ur: 0.2 mg/dL (ref 0.2–1.0)
pH, UA: 7 (ref 5.0–7.5)

## 2021-09-13 LAB — LIPID PANEL
Chol/HDL Ratio: 3.4 ratio (ref 0.0–4.4)
Cholesterol, Total: 145 mg/dL (ref 100–199)
HDL: 43 mg/dL (ref >39)
LDL Chol Calc (NIH): 78 mg/dL (ref 0–99)
Triglycerides: 137 mg/dL (ref 0–149)
VLDL Cholesterol Cal: 24 mg/dL (ref 5–40)

## 2021-09-13 LAB — COMPREHENSIVE METABOLIC PANEL
ALT: 35 IU/L — ABNORMAL HIGH (ref 0–32)
AST: 36 IU/L (ref 0–40)
Albumin/Globulin Ratio: 1.4 (ref 1.2–2.2)
Albumin: 4.2 g/dL (ref 3.9–4.9)
Alkaline Phosphatase: 87 IU/L (ref 44–121)
BUN/Creatinine Ratio: 24 (ref 12–28)
BUN: 18 mg/dL (ref 8–27)
Bilirubin Total: 0.4 mg/dL (ref 0.0–1.2)
CO2: 24 mmol/L (ref 20–29)
Calcium: 9.2 mg/dL (ref 8.7–10.3)
Chloride: 98 mmol/L (ref 96–106)
Creatinine, Ser: 0.76 mg/dL (ref 0.57–1.00)
Globulin, Total: 3.1 g/dL (ref 1.5–4.5)
Glucose: 98 mg/dL (ref 70–99)
Potassium: 4.9 mmol/L (ref 3.5–5.2)
Sodium: 135 mmol/L (ref 134–144)
Total Protein: 7.3 g/dL (ref 6.0–8.5)
eGFR: 87 mL/min/{1.73_m2} (ref >59)

## 2021-09-13 LAB — HEMOGLOBIN A1C
Est. average glucose Bld gHb Est-mCnc: 126 mg/dL
Hgb A1c MFr Bld: 6 % — ABNORMAL HIGH (ref 4.8–5.6)

## 2021-09-13 LAB — TSH: TSH: 2.73 u[IU]/mL (ref 0.450–4.500)

## 2021-09-20 ENCOUNTER — Telehealth: Payer: Self-pay

## 2021-09-20 ENCOUNTER — Ambulatory Visit (INDEPENDENT_AMBULATORY_CARE_PROVIDER_SITE_OTHER): Payer: Medicare Other

## 2021-09-20 ENCOUNTER — Encounter: Payer: Self-pay | Admitting: Podiatry

## 2021-09-20 ENCOUNTER — Encounter: Payer: Self-pay | Admitting: Internal Medicine

## 2021-09-20 ENCOUNTER — Ambulatory Visit (INDEPENDENT_AMBULATORY_CARE_PROVIDER_SITE_OTHER): Payer: Medicare Other | Admitting: Podiatry

## 2021-09-20 DIAGNOSIS — M779 Enthesopathy, unspecified: Secondary | ICD-10-CM | POA: Diagnosis not present

## 2021-09-20 DIAGNOSIS — M7752 Other enthesopathy of left foot: Secondary | ICD-10-CM

## 2021-09-20 DIAGNOSIS — M19072 Primary osteoarthritis, left ankle and foot: Secondary | ICD-10-CM

## 2021-09-20 NOTE — Progress Notes (Signed)
Subjective:  Patient ID: Judith Wood, female    DOB: 10-03-1955,  MRN: [id number]  Chief Complaint  Patient presents with   Foot Pain    "My left foot hurts on top of the big toe."    66 y.o. female presents with the above complaint.  Patient presents with complaint of left first MPJ capsulitis with underlying osteoarthritis.  Patient states that started all of a sudden this morning and has progressive gotten worse.  She states it hurts with bending.  She wanted get it evaluated.  She denies any other treatment options she has not seen anyone else prior to seeing me.  She denies any other acute complaints.  Pain scale 7 out of 10.   Review of Systems: Negative except as noted in the HPI. Denies N/V/F/Ch.  Past Medical History:  Diagnosis Date   Abnormal mammogram of right breast 01/18/2016   Afib ([facility])    Hypertension     Current Outpatient Medications:    apixaban (ELIQUIS) 5 MG TABS tablet, Take 1 tablet (5 mg total) by mouth 2 (two) times daily., Disp: 60 tablet, Rfl: 3   b complex vitamins tablet, Take 1 tablet by mouth daily., Disp: , Rfl:    Calcium Citrate-Vitamin D (CALCIUM CITRATE + D PO), Take 2 tablets by mouth daily., Disp: , Rfl:    CHOLECALCIFEROL PO, Take 5,000 Units by mouth daily., Disp: , Rfl:    D-MANNOSE PO, Take 1 capsule by mouth daily., Disp: , Rfl:    DILT-XR 180 MG 24 hr capsule, Take 1 capsule by mouth once daily (Patient taking differently: Take 180 mg by mouth daily.), Disp: 90 capsule, Rfl: 0   diltiazem (DILT-XR) 180 MG 24 hr capsule, Take 1 capsule (180 mg total) by mouth daily., Disp: 90 capsule, Rfl: 1   Fe Bisgly-Vit C-Vit B12-FA (GENTLE IRON PO), Take 25 mg by mouth in the morning and at bedtime., Disp: , Rfl:    flecainide (TAMBOCOR) 150 MG tablet, Take 1 tablet (150 mg total) by mouth 2 (two) times daily as needed., Disp: 90 tablet, Rfl: 3   glucosamine-chondroitin 500-400 MG tablet, Take 1 tablet by mouth 3 (three) times daily., Disp: , Rfl:     magnesium 30 MG tablet, Take 30 mg by mouth 2 (two) times daily., Disp: , Rfl:    metoprolol succinate (TOPROL-XL) 25 MG 24 hr tablet, Take 1 tablet (25 mg total) by mouth daily. Take with or immediately following a meal., Disp: 90 tablet, Rfl: 3   oxybutynin (DITROPAN XL) 5 MG 24 hr tablet, Take 1 tablet (5 mg total) by mouth at bedtime., Disp: 30 tablet, Rfl: 0  Social History   Tobacco Use  Smoking Status Never  Smokeless Tobacco Never    Allergies  Allergen Reactions   Norco [Hydrocodone-Acetaminophen] Itching   Tetracyclines & Related Itching and Rash   Objective:  There were no vitals filed for this visit. There is no height or weight on file to calculate BMI. Constitutional Well developed. Well nourished.  Vascular Dorsalis pedis pulses palpable bilaterally. Posterior tibial pulses palpable bilaterally. Capillary refill normal to all digits.  No cyanosis or clubbing noted. Pedal hair growth normal.  Neurologic Normal speech. Oriented to person, place, and time. Epicritic sensation to light touch grossly present bilaterally.  Dermatologic Nails well groomed and normal in appearance. No open wounds. No skin lesions.  Orthopedic: Pain on palpation at the end range of motion of first MPJ deep intra-articular first MPJ pain noted.  Some  crepitus noted.  Pain with range of motion of the first MPJ joint.  Sesamoiditis noted.   Radiographs: 3 views of skeletally mature adult left foot: Osteoarthritic changes noted to the left first MPJ hallux rigidus noted.  No spurring or osteophytes noted.  No other bony abnormalities identified Assessment:   1. Capsulitis of metatarsophalangeal (MTP) joint of left foot   2. Arthritis of first metatarsophalangeal (MTP) joint of left foot    Plan:  Patient was evaluated and treated and all questions answered.  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  Given the amount of pain that she is having she will benefit from a steroid injection of decrease pain inflammatory component associate with pain.  Patient agrees with plan like to proceed with steroid injection -A steroid injection was performed at left first MTP using 1% plain Lidocaine and 10 mg of Kenalog. This was well tolerated.   No follow-ups on file.

## 2021-09-20 NOTE — Telephone Encounter (Signed)
Blood thinner clearance faxed to Dr Donivan Scull office for [employer]

## 2021-09-21 ENCOUNTER — Telehealth: Payer: Self-pay | Admitting: Cardiovascular Disease

## 2021-09-21 NOTE — Telephone Encounter (Signed)
Please review.  KP

## 2021-09-21 NOTE — Telephone Encounter (Signed)
   Pre-operative Risk Assessment    Patient Name: Judith Wood  DOB: 25-Nov-1955 MRN: [id number]      Request for Surgical Clearance    Procedure:   COLONOSCOPY  Date of Surgery:  Clearance 09/29/21                                Surgeon:  NOT LISTED Surgeon's Group or Practice Name:  [facility] Phone number:  [phone] Fax number:  [phone]  Type of Clearance Requested:   - Pharmacy:  Hold Apixaban (Eliquis)    Type of Anesthesia:  Not Indicated   Additional requests/questions:    Signed, Eli Phillips   09/21/2021, 1:51 PM

## 2021-09-22 NOTE — Telephone Encounter (Signed)
Patient with diagnosis of afib on Eliquis for anticoagulation.    Procedure: colonoscopy Date of procedure: 09/29/21  CHA2DS2-VASc Score = 3  This indicates a 3.2% annual risk of stroke. The patient's score is based upon: CHF History: 0 HTN History: 1 Diabetes History: 0 Stroke History: 0 Vascular Disease History: 0 Age Score: 1 Gender Score: 1  CrCl 40m/min using adjusted body weight due to obesity Platelet count 344K  Per office protocol, patient can hold Eliquis for 1-2 days prior to procedure.    **This guidance is not considered finalized until pre-operative APP has relayed final recommendations.**

## 2021-09-22 NOTE — Telephone Encounter (Signed)
   Primary Cardiologist: Ida Rogue, MD  Chart reviewed as part of pre-operative protocol coverage. Given past medical history and time since last visit, based on ACC/AHA guidelines, Judith Wood would be at acceptable risk for the planned procedure without further cardiovascular testing.   Per office protocol, patient can hold Eliquis for 1-2 days prior to procedure.    I will route this recommendation to the requesting party via Epic fax function and remove from pre-op pool.  Please call with questions.  Emmaline Life, NP-C    09/22/2021, 11:41 AM [place] [address] Office [phone] Fax [phone]

## 2021-09-23 ENCOUNTER — Telehealth: Payer: Self-pay

## 2021-09-23 NOTE — Telephone Encounter (Signed)
Patients call has been returned in regards to she should hold her Eliquis.  She has been advised  from Dr. Donivan Scull chart note 09/21/21 "Per office protocol, patient can hold Eliquis for 1-2 days prior to procedure".  Pt verbalized understanding.  Thanks,  [place], [place]

## 2021-09-28 ENCOUNTER — Encounter: Payer: Self-pay | Admitting: Gastroenterology

## 2021-09-29 ENCOUNTER — Ambulatory Visit
Admission: RE | Admit: 2021-09-29 | Discharge: 2021-09-29 | Disposition: A | Discharge status: Home / Self Care | Payer: Medicare Other | Attending: Gastroenterology | Admitting: Gastroenterology

## 2021-09-29 ENCOUNTER — Encounter: Payer: Self-pay | Admitting: Gastroenterology

## 2021-09-29 ENCOUNTER — Ambulatory Visit: Payer: Medicare Other | Admitting: Anesthesiology

## 2021-09-29 ENCOUNTER — Encounter: Payer: Self-pay | Admitting: Cardiovascular Disease

## 2021-09-29 ENCOUNTER — Encounter
Admission: RE | Disposition: A | Discharge status: Home / Self Care | Payer: Self-pay | Source: Home / Self Care | Attending: Gastroenterology

## 2021-09-29 DIAGNOSIS — D126 Benign neoplasm of colon, unspecified: Secondary | ICD-10-CM

## 2021-09-29 DIAGNOSIS — K635 Polyp of colon: Secondary | ICD-10-CM | POA: Diagnosis not present

## 2021-09-29 DIAGNOSIS — Z86010 Personal history of colonic polyps: Secondary | ICD-10-CM

## 2021-09-29 DIAGNOSIS — Z79899 Other long term (current) drug therapy: Secondary | ICD-10-CM | POA: Insufficient documentation

## 2021-09-29 DIAGNOSIS — I4891 Unspecified atrial fibrillation: Secondary | ICD-10-CM | POA: Insufficient documentation

## 2021-09-29 DIAGNOSIS — Z6839 Body mass index (BMI) 39.0-39.9, adult: Secondary | ICD-10-CM | POA: Diagnosis not present

## 2021-09-29 DIAGNOSIS — Z9049 Acquired absence of other specified parts of digestive tract: Secondary | ICD-10-CM | POA: Insufficient documentation

## 2021-09-29 DIAGNOSIS — I10 Essential (primary) hypertension: Secondary | ICD-10-CM | POA: Diagnosis not present

## 2021-09-29 DIAGNOSIS — Z1211 Encounter for screening for malignant neoplasm of colon: Secondary | ICD-10-CM | POA: Insufficient documentation

## 2021-09-29 DIAGNOSIS — Z9884 Bariatric surgery status: Secondary | ICD-10-CM | POA: Insufficient documentation

## 2021-09-29 HISTORY — PX: COLONOSCOPY WITH PROPOFOL: SHX5780

## 2021-09-29 HISTORY — DX: Cardiac arrhythmia, unspecified: I49.9

## 2021-09-29 SURGERY — COLONOSCOPY WITH PROPOFOL
Anesthesia: General

## 2021-09-29 MED ORDER — PHENYLEPHRINE 80 MCG/ML (10ML) SYRINGE FOR IV PUSH (FOR BLOOD PRESSURE SUPPORT)
PREFILLED_SYRINGE | INTRAVENOUS | Status: DC | PRN
  Administered 2021-09-29 (×2): 160 ug via INTRAVENOUS
  Administered 2021-09-29: 80 ug via INTRAVENOUS

## 2021-09-29 MED ORDER — SODIUM CHLORIDE 0.9 % IV SOLN: INTRAVENOUS | Status: DC

## 2021-09-29 MED ORDER — PROPOFOL 500 MG/50ML IV EMUL
INTRAVENOUS | Status: DC | PRN
  Administered 2021-09-29: 70 mg via INTRAVENOUS
  Administered 2021-09-29: 125 ug/kg/min via INTRAVENOUS

## 2021-09-29 MED ORDER — LIDOCAINE HCL (CARDIAC) PF 100 MG/5ML IV SOSY
PREFILLED_SYRINGE | INTRAVENOUS | Status: DC | PRN
  Administered 2021-09-29: 50 mg via INTRAVENOUS

## 2021-09-29 NOTE — Op Note (Signed)
[facility] Patient Name: Judith Wood Procedure Date: 09/29/2021 11:05 AM MRN: [id number] Account #: [id number] Date of Birth: 1955-05-30 Admit Type: Outpatient Age: 66 Room: [facility] ENDO ROOM 3 Gender: Female Note Status: Finalized Instrument Name: Jasper Riling [id number] Procedure:             Colonoscopy Indications:           Screening for colorectal malignant neoplasm Providers:             Jonathon Bellows MD, MD Referring MD:          Halina Maidens, MD (Referring MD) Medicines:             Monitored Anesthesia Care Complications:         No immediate complications. Procedure:             Pre-Anesthesia Assessment:                        - Prior to the procedure, a History and Physical was                         performed, and patient medications, allergies and                         sensitivities were reviewed. The patient's tolerance                         of previous anesthesia was reviewed.                        - The risks and benefits of the procedure and the                         sedation options and risks were discussed with the                         patient. All questions were answered and informed                         consent was obtained.                        - ASA Grade Assessment: II - A patient with mild                         systemic disease.                        After obtaining informed consent, the colonoscope was                         passed under direct vision. Throughout the procedure,                         the patient's blood pressure, pulse, and oxygen                         saturations were monitored continuously. The                         Colonoscope was introduced  through the anus and                         advanced to the the cecum, identified by the                         appendiceal orifice. The colonoscopy was performed                         with ease. The patient tolerated the procedure well.                          The quality of the bowel preparation was excellent. Findings:      The perianal and digital rectal examinations were normal.      A 5 mm polyp was found in the sigmoid colon. The polyp was sessile. The       polyp was removed with a cold snare. Resection and retrieval were       complete.      The exam was otherwise without abnormality on direct and retroflexion       views. Impression:            - One 5 mm polyp in the sigmoid colon, removed with a                         cold snare. Resected and retrieved.                        - The examination was otherwise normal on direct and                         retroflexion views. Recommendation:        - Discharge patient to home (with escort).                        - Resume previous diet.                        - Continue present medications.                        - Await pathology results.                        - Repeat colonoscopy for surveillance based on                         pathology results. Procedure Code(s):     --- Professional ---                        [phone], Colonoscopy, flexible; with removal of                         tumor(s), polyp(s), or other lesion(s) by snare                         technique Diagnosis Code(s):     --- Professional ---  Z12.11, Encounter for screening for malignant neoplasm                         of colon                        K63.5, Polyp of colon CPT copyright 2019 American Medical Association. All rights reserved. The codes documented in this report are preliminary and upon coder review may  be revised to meet current compliance requirements. Jonathon Bellows, MD Jonathon Bellows MD, MD 09/29/2021 11:34:09 AM This report has been signed electronically. Number of Addenda: 0 Note Initiated On: 09/29/2021 11:05 AM Scope Withdrawal Time: 0 hours 12 minutes 3 seconds  Total Procedure Duration: 0 hours 15 minutes 19 seconds  Estimated Blood Loss:  Estimated  blood loss: none.      [facility]

## 2021-09-29 NOTE — Transfer of Care (Signed)
Immediate Anesthesia Transfer of Care Note  Patient: Judith Wood  Procedure(s) Performed: COLONOSCOPY WITH PROPOFOL  Patient Location: Endoscopy Unit  Anesthesia Type:General  Level of Consciousness: awake, alert  and oriented  Airway & Oxygen Therapy: Patient Spontanous Breathing  Post-op Assessment: Report given to RN and Post -op Vital signs reviewed and stable  Post vital signs: Reviewed and stable  Last Vitals:  Vitals Value Taken Time  BP 99/69 09/29/21 1139  Temp 35.7 C 09/29/21 1137  Pulse 91 09/29/21 1139  Resp 21 09/29/21 1139  SpO2 95 % 09/29/21 1139    Last Pain:  Vitals:   09/29/21 1137  TempSrc: Temporal  PainSc: Asleep         Complications: No notable events documented.

## 2021-09-29 NOTE — H&P (Signed)
Jonathon Bellows, MD [address] [place], [place], [place], [address] [address], [place], [place], [place], [address] Phone: [phone]  Fax: [phone]  Primary Care Physician:  Glean Hess, MD   Pre-Procedure History & Physical: HPI:  Judith Wood is a 66 y.o. female is here for an colonoscopy.   Past Medical History:  Diagnosis Date   Abnormal mammogram of right breast 01/18/2016   Afib ([place])    Dysrhythmia    Hypertension     Past Surgical History:  Procedure Laterality Date   CHOLECYSTECTOMY     ROUX-EN-Y GASTRIC BYPASS  2009   TOTAL ABDOMINAL HYSTERECTOMY  2000   WRIST SURGERY      Prior to Admission medications   Medication Sig Start Date End Date Taking? Authorizing Provider  diltiazem (DILT-XR) 180 MG 24 hr capsule Take 1 capsule (180 mg total) by mouth daily. 09/07/21  Yes Glean Hess, MD  flecainide (TAMBOCOR) 150 MG tablet Take 1 tablet (150 mg total) by mouth 2 (two) times daily as needed. 09/09/21  Yes Minna Merritts, MD  metoprolol succinate (TOPROL-XL) 25 MG 24 hr tablet Take 1 tablet (25 mg total) by mouth daily. Take with or immediately following a meal. 09/09/21  Yes Gollan, Kathlene November, MD  apixaban (ELIQUIS) 5 MG TABS tablet Take 1 tablet (5 mg total) by mouth 2 (two) times daily. 09/09/21   Minna Merritts, MD  b complex vitamins tablet Take 1 tablet by mouth daily.    [provider]  Calcium Citrate-Vitamin D (CALCIUM CITRATE + D PO) Take 2 tablets by mouth daily.    [provider]  CHOLECALCIFEROL PO Take 5,000 Units by mouth daily.    [provider]  D-MANNOSE PO Take 1 capsule by mouth daily.    [provider]  DILT-XR 180 MG 24 hr capsule Take 1 capsule by mouth once daily Patient taking differently: Take 180 mg by mouth daily. 06/27/21   Glean Hess, MD  Fe Bisgly-Vit C-Vit B12-FA (GENTLE IRON PO) Take 25 mg by mouth in the morning and at bedtime.    [provider]   glucosamine-chondroitin 500-400 MG tablet Take 1 tablet by mouth 3 (three) times daily.    [provider]  magnesium 30 MG tablet Take 30 mg by mouth 2 (two) times daily.    [provider]  oxybutynin (DITROPAN XL) 5 MG 24 hr tablet Take 1 tablet (5 mg total) by mouth at bedtime. 09/07/21   Glean Hess, MD    Allergies as of 09/08/2021 - Review Complete 09/08/2021  Allergen Reaction Noted   Norco [hydrocodone-acetaminophen] Itching 09/19/2015   Tetracyclines & related Itching and Rash 09/19/2015    Family History  Problem Relation Age of Onset   Hypertension Mother    Arrhythmia Mother        Pacemaker implant    Heart attack Father 46   Arrhythmia Sister    Arrhythmia Brother    Arrhythmia Brother    Breast cancer Neg Hx     Social History   Socioeconomic History   Marital status: Married    Spouse name: Not on file   Number of children: 1   Years of education: Not on file   Highest education level: Not on file  Occupational History   Not on file  Tobacco Use   Smoking status: Never   Smokeless tobacco: Never  Vaping Use   Vaping Use: Never used  Substance and Sexual Activity   Alcohol use: No   Drug use: No   Sexual activity: Not on file  Other Topics Concern   Not on file  Social History Narrative   Not on file   Social Determinants of Health   Financial Resource Strain: Unknown (03/11/2018)   Overall Financial Resource Strain (CARDIA)    Difficulty of Paying Living Expenses: Patient refused  Food Insecurity: Unknown (03/11/2018)   Hunger Vital Sign    Worried About Running Out of Food in the Last Year: Patient refused    [place] in the Last Year: Patient refused  Transportation Needs: Not on file  Physical Activity: Unknown (03/11/2018)   Exercise Vital Sign    Days of Exercise per Week: Patient refused    Minutes of Exercise per Session: Patient refused  Stress: Not on file  Social Connections: Unknown (03/11/2018)   Social  Connection and Isolation Panel [NHANES]    Frequency of Communication with Friends and Family: Patient refused    Frequency of Social Gatherings with Friends and Family: Patient refused    Attends Religious Services: Patient refused    Active Member of Clubs or Organizations: Patient refused    Attends Archivist Meetings: Patient refused    Marital Status: Patient refused  Intimate Partner Violence: Unknown (03/11/2018)   Humiliation, Afraid, Rape, and Kick questionnaire    Fear of Current or Ex-Partner: Patient refused    Emotionally Abused: Patient refused    Physically Abused: Patient refused    Sexually Abused: Patient refused    Review of Systems: See HPI, otherwise negative ROS  Physical Exam: BP (!) 131/92   Pulse 82   Temp (!) 96.5 F (35.8 C) (Temporal)   Resp 18   Ht '5\' 5"'$  (1.651 m)   Wt 108.9 kg   SpO2 98%   BMI 39.94 kg/m  General:   Alert,  pleasant and cooperative in NAD Head:  Normocephalic and atraumatic. Neck:  Supple; no masses or thyromegaly. Lungs:  Clear throughout to auscultation, normal respiratory effort.    Heart:  +S1, +S2, Regular rate and rhythm, No edema. Abdomen:  Soft, nontender and nondistended. Normal bowel sounds, without guarding, and without rebound.   Neurologic:  Alert and  oriented x4;  grossly normal neurologically.  Impression/Plan: Judith Wood is here for an colonoscopy to be performed for Screening colonoscopy average risk   Risks, benefits, limitations, and alternatives regarding  colonoscopy have been reviewed with the patient.  Questions have been answered.  All parties agreeable.   Jonathon Bellows, MD  09/29/2021, 11:06 AM

## 2021-09-29 NOTE — Anesthesia Preprocedure Evaluation (Signed)
Anesthesia Evaluation  Patient identified by MRN, date of birth, ID band Patient awake    Reviewed: Allergy & Precautions, NPO status , Patient's Chart, lab work & pertinent test results  Airway Mallampati: II  TM Distance: >3 FB Neck ROM: Full    Dental  (+) Teeth Intact, Caps,    Pulmonary neg pulmonary ROS,    Pulmonary exam normal  + decreased breath sounds      Cardiovascular Exercise Tolerance: Good hypertension, Pt. on medications negative cardio ROS Normal cardiovascular exam+ dysrhythmias Atrial Fibrillation  Rhythm:Irregular     Neuro/Psych negative neurological ROS  negative psych ROS   GI/Hepatic negative GI ROS, Neg liver ROS,   Endo/Other  negative endocrine ROSMorbid obesity  Renal/GU negative Renal ROS  negative genitourinary   Musculoskeletal   Abdominal (+) + obese,   Peds negative pediatric ROS (+)  Hematology negative hematology ROS (+)   Anesthesia Other Findings Past Medical History: 01/18/2016: Abnormal mammogram of right breast No date: Afib ([place]) No date: Dysrhythmia No date: Hypertension  Past Surgical History: No date: CHOLECYSTECTOMY 2009: ROUX-EN-Y GASTRIC BYPASS 2000: TOTAL ABDOMINAL HYSTERECTOMY No date: WRIST SURGERY  BMI    Body Mass Index: 39.94 kg/m      Reproductive/Obstetrics negative OB ROS                             Anesthesia Physical Anesthesia Plan  ASA: 3  Anesthesia Plan: General   Post-op Pain Management:    Induction: Intravenous  PONV Risk Score and Plan: Propofol infusion and TIVA  Airway Management Planned:   Additional Equipment:   Intra-op Plan:   Post-operative Plan:   Informed Consent: I have reviewed the patients History and Physical, chart, labs and discussed the procedure including the risks, benefits and alternatives for the proposed anesthesia with the patient or authorized representative who has  indicated his/her understanding and acceptance.     Dental Advisory Given  Plan Discussed with: CRNA and Surgeon  Anesthesia Plan Comments:         Anesthesia Quick Evaluation

## 2021-09-30 ENCOUNTER — Ambulatory Visit
Admission: RE | Admit: 2021-09-30 | Discharge: 2021-09-30 | Disposition: A | Discharge status: Home / Self Care | Payer: Medicare Other | Source: Ambulatory Visit | Attending: Internal Medicine | Admitting: Internal Medicine

## 2021-09-30 DIAGNOSIS — Z1231 Encounter for screening mammogram for malignant neoplasm of breast: Secondary | ICD-10-CM | POA: Insufficient documentation

## 2021-09-30 LAB — SURGICAL PATHOLOGY

## 2021-09-30 NOTE — Anesthesia Postprocedure Evaluation (Signed)
Anesthesia Post Note  Patient: Judith Wood  Procedure(s) Performed: COLONOSCOPY WITH PROPOFOL  Patient location during evaluation: PACU Anesthesia Type: General Level of consciousness: awake and awake and alert Pain management: satisfactory to patient Vital Signs Assessment: post-procedure vital signs reviewed and stable Respiratory status: spontaneous breathing and respiratory function stable Cardiovascular status: stable Anesthetic complications: no   No notable events documented.   Last Vitals:  Vitals:   09/29/21 1147 09/29/21 1150  BP: 138/75 138/75  Pulse: 79   Resp: (!) 22   Temp:    SpO2: 97%     Last Pain:  Vitals:   09/29/21 1147  TempSrc:   PainSc: 0-No pain                 VAN STAVEREN,Joshuah Minella

## 2021-10-03 ENCOUNTER — Encounter: Payer: Self-pay | Admitting: Gastroenterology

## 2021-10-06 ENCOUNTER — Other Ambulatory Visit: Payer: Self-pay | Admitting: Internal Medicine

## 2021-10-06 DIAGNOSIS — N3281 Overactive bladder: Secondary | ICD-10-CM

## 2021-10-06 NOTE — Telephone Encounter (Signed)
Requested Prescriptions  Pending Prescriptions Disp Refills  . oxybutynin (DITROPAN-XL) 5 MG 24 hr tablet [Pharmacy Med Name: OXYBUTYNIN CL ER 5 MG TABLET] 30 tablet 0    Sig: TAKE 1 TABLET BY MOUTH EVERYDAY AT BEDTIME     Urology:  Bladder Agents Passed - 10/06/2021  4:30 AM      Passed - Valid encounter within last 12 months    Recent Outpatient Visits          4 weeks ago Benign essential HTN   [facility] Glean Hess, MD   9 months ago [facility] Glean Hess, MD   1 year ago Annual physical exam   [facility] Glean Hess, MD   2 years ago Annual physical exam   [facility] Glean Hess, MD   3 years ago Recurrent UTI   [facility] Glean Hess, MD      Future Appointments            In 5 months Army Melia Jesse Sans, MD [facility], [facility]

## 2021-10-21 ENCOUNTER — Ambulatory Visit: Payer: Self-pay | Admitting: *Deleted

## 2021-10-21 NOTE — Telephone Encounter (Signed)
  Chief Complaint: urinary sx requesting appt  Symptoms: urinary frequency, burning with urination, odor from incontinent pads. Reports last UTI in July and concerned why she continues to have UTIs Frequency: 2 weeks  Pertinent Negatives: Patient denies fever, no blood in urine  Disposition: '[]'$ ED /'[x]'$ Urgent Care (no appt availability in office) / '[]'$ Appointment(In office/virtual)/ '[]'$  [facility]/ '[]'$ Home Care/ '[]'$ Refused Recommended Disposition /'[]'$ [facility]/ '[]'$  Follow-up with PCP Additional Notes:   No available appt patient can make today, she is going out of town. Recommended UC today due to sx. Patient reports she may go to [facility] tomorrow . Appt scheduled 10/25/21 for review of medication options for incontinence.     Reason for Disposition  All other patients with painful urination  (Exception: [1] EITHER frequency or urgency AND [2] has on-call doctor.)  Answer Assessment - Initial Assessment Questions 1. SEVERITY: "How bad is the pain?"  (e.g., Scale 1-10; mild, moderate, or severe)   - MILD (1-3): complains slightly about urination hurting   - MODERATE (4-7): interferes with normal activities     - SEVERE (8-10): excruciating, unwilling or unable to urinate because of the pain      Pain / burning with urination 2. FREQUENCY: "How many times have you had painful urination today?"      With voiding  3. PATTERN: "Is pain present every time you urinate or just sometimes?"      na 4. ONSET: "When did the painful urination start?"      Approx 2 weeks ago  5. FEVER: "Do you have a fever?" If Yes, ask: "What is your temperature, how was it measured, and when did it start?"     na 6. PAST UTI: "Have you had a urine infection before?" If Yes, ask: "When was the last time?" and "What happened that time?"      Yes last was in July  7. CAUSE: "What do you think is causing the painful urination?"  (e.g., UTI, scratch, Herpes sore)     UTI 8. OTHER SYMPTOMS: "Do you have any  other symptoms?" (e.g., blood in urine, flank pain, genital sores, urgency, vaginal discharge)     Burning, frequency, incontinence and odor 9. PREGNANCY: "Is there any chance you are pregnant?" "When was your last menstrual period?"     na  Protocols used: Urination Pain - Female-A-AH

## 2021-10-25 ENCOUNTER — Ambulatory Visit (INDEPENDENT_AMBULATORY_CARE_PROVIDER_SITE_OTHER): Payer: Medicare Other | Admitting: Internal Medicine

## 2021-10-25 ENCOUNTER — Encounter: Payer: Self-pay | Admitting: Internal Medicine

## 2021-10-25 VITALS — BP 136/70 | HR 62 | Ht 65.0 in | Wt 249.0 lb

## 2021-10-25 DIAGNOSIS — N3281 Overactive bladder: Secondary | ICD-10-CM

## 2021-10-25 DIAGNOSIS — I10 Essential (primary) hypertension: Secondary | ICD-10-CM | POA: Diagnosis not present

## 2021-10-25 DIAGNOSIS — Z6841 Body Mass Index (BMI) 40.0 and over, adult: Secondary | ICD-10-CM

## 2021-10-25 DIAGNOSIS — R300 Dysuria: Secondary | ICD-10-CM

## 2021-10-25 LAB — POCT URINALYSIS DIPSTICK
Bilirubin, UA: NEGATIVE
Blood, UA: NEGATIVE
Glucose, UA: NEGATIVE
Ketones, UA: NEGATIVE
Leukocytes, UA: NEGATIVE
Nitrite, UA: NEGATIVE
Protein, UA: NEGATIVE
Spec Grav, UA: 1.02 (ref 1.010–1.025)
Urobilinogen, UA: 0.2 E.U./dL
pH, UA: 6 (ref 5.0–8.0)

## 2021-10-25 MED ORDER — FESOTERODINE FUMARATE ER 4 MG PO TB24: 4.0000 mg | ORAL_TABLET | Freq: Every day | ORAL | 0 refills | Status: DC

## 2021-10-25 NOTE — Progress Notes (Signed)
Date:  10/25/2021   Name:  Judith Wood   DOB:  1956/01/17   MRN:  [id number]   Chief Complaint: Urinary Incontinence (X2-3 weeks. Burning while urination. Incontinence. )  Dysuria  This is a new problem. The current episode started 1 to 4 weeks ago. The problem has been waxing and waning. Associated symptoms include frequency and urgency. Pertinent negatives include no chills or hematuria.  She has had urinary incontinence for some time.  Started Ditropan and then thought she had a UTI.  Treated with Keflex.   The Ditropan was great for her OAB symptoms but every time she took it she had burning with urination.  This was partially relieved by D-mannose. Today the dysuria has resolved. UA is negative.  Weight - on [employer] now but the current program is not working.  She is interested in Medical weight loss program.  She is s/p gastric bypass.  She is concerned about her A1C at 6 last visit.  Lab Results  Component Value Date   NA 135 09/12/2021   K 4.9 09/12/2021   CO2 24 09/12/2021   GLUCOSE 98 09/12/2021   BUN 18 09/12/2021   CREATININE 0.76 09/12/2021   CALCIUM 9.2 09/12/2021   EGFR 87 09/12/2021   GFRNONAA 78 03/12/2021   Lab Results  Component Value Date   CHOL 145 09/12/2021   HDL 43 09/12/2021   LDLCALC 78 09/12/2021   TRIG 137 09/12/2021   CHOLHDL 3.4 09/12/2021   Lab Results  Component Value Date   TSH 2.730 09/12/2021   Lab Results  Component Value Date   HGBA1C 6.0 (H) 09/12/2021   Lab Results  Component Value Date   WBC 5.0 09/12/2021   HGB 12.2 09/12/2021   HCT 38.5 09/12/2021   MCV 83 09/12/2021   PLT 344 09/12/2021   Lab Results  Component Value Date   ALT 35 (H) 09/12/2021   AST 36 09/12/2021   ALKPHOS 87 09/12/2021   BILITOT 0.4 09/12/2021   Lab Results  Component Value Date   VD25OH 67 03/12/2021     Review of Systems  Constitutional:  Negative for chills, fatigue, fever and unexpected weight change.  Respiratory:  Negative for cough  and shortness of breath.   Cardiovascular:  Negative for chest pain.  Genitourinary:  Positive for dysuria, frequency and urgency. Negative for hematuria.  Neurological:  Negative for dizziness and headaches.  Psychiatric/Behavioral:  Negative for sleep disturbance. The patient is not nervous/anxious.     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   Vitamin D deficiency 01/18/2016    Allergies  Allergen Reactions   Norco [Hydrocodone-Acetaminophen] Itching   Tetracyclines & Related Itching and Rash    Past Surgical History:  Procedure Laterality Date   CHOLECYSTECTOMY     COLONOSCOPY WITH PROPOFOL N/A 09/29/2021   Procedure: COLONOSCOPY WITH PROPOFOL;  Surgeon: Jonathon Bellows, MD;  Location[facility] END[facility]COPY;  Service: Gastroenterology;  Laterality: N/A;   ROUX-EN-Y GASTRIC BYPASS  2009   TOTAL ABDOMINAL HYSTERECTOMY  2000   WRIST SURGERY      Social History   Tobacco Use   Smoking status: Never  Smokeless tobacco: Never  Vaping Use   Vaping Use: Never used  Substance Use Topics   Alcohol use: No   Drug use: No     Medication list has been reviewed and updated.  Current Meds  Medication Sig   apixaban (ELIQUIS) 5 MG TABS tablet Take 1 tablet (5 mg total) by mouth 2 (two) times daily.   b complex vitamins tablet Take 1 tablet by mouth daily.   Calcium Citrate-Vitamin D (CALCIUM CITRATE + D PO) Take 2 tablets by mouth daily.   CHOLECALCIFEROL PO Take 5,000 Units by mouth daily.   D-MANNOSE PO Take 1 capsule by mouth daily.   DILT-XR 180 MG 24 hr capsule Take 1 capsule by mouth once daily (Patient taking  differently: Take 180 mg by mouth daily.)   Fe Bisgly-Vit C-Vit B12-FA (GENTLE IRON PO) Take 25 mg by mouth in the morning and at bedtime.   flecainide (TAMBOCOR) 150 MG tablet Take 1 tablet (150 mg total) by mouth 2 (two) times daily as needed.   glucosamine-chondroitin 500-400 MG tablet Take 1 tablet by mouth 3 (three) times daily.   magnesium 30 MG tablet Take 30 mg by mouth 2 (two) times daily.   metoprolol succinate (TOPROL-XL) 25 MG 24 hr tablet Take 1 tablet (25 mg total) by mouth daily. Take with or immediately following a meal.       09/07/2021   10:51 AM 12/29/2020    1:52 PM 04/05/2020    8:43 AM  GAD 7 : Generalized Anxiety Score  Nervous, Anxious, on Edge 0 0 0  Control/stop worrying 0 0 0  Worry too much - different things 0 0 0  Trouble relaxing 0 0 0  Restless 0 0 0  Easily annoyed or irritable 0 0 0  Afraid - awful might happen 0 0 0  Total GAD 7 Score 0 0 0  Anxiety Difficulty Not difficult at all Not difficult at all        09/07/2021   10:51 AM 12/29/2020    1:52 PM 04/05/2020    8:43 AM  Depression screen PHQ 2/9  Decreased Interest 1 1 0  Down, Depressed, Hopeless 1 0 0  PHQ - 2 Score 2 1 0  Altered sleeping 1 0 0  Tired, decreased energy 1 1 0  Change in appetite 0 1 0  Feeling bad or failure about yourself  0 0 0  Trouble concentrating 0 0 0  Moving slowly or fidgety/restless 0 0 0  Suicidal thoughts 0 0 0  PHQ-9 Score 4 3 0  Difficult doing work/chores Not difficult at all Not difficult at all     BP Readings from Last 3 Encounters:  10/25/21 136/70  09/29/21 138/75  09/10/21 139/78    Physical Exam Vitals and nursing note reviewed.  Constitutional:      General: She is not in acute distress.    Appearance: Normal appearance. She is well-developed.  HENT:     Head: Normocephalic and atraumatic.  Cardiovascular:     Rate and Rhythm: Normal rate and regular rhythm.  Pulmonary:     Effort: Pulmonary effort is normal. No respiratory  distress.     Breath sounds: No wheezing or rhonchi.  Abdominal:     Tenderness: There is no right CVA tenderness or left CVA tenderness.  Musculoskeletal:     Cervical back: Normal range of motion.  Lymphadenopathy:     Cervical: No cervical adenopathy.  Skin:    General: Skin  is warm and dry.     Findings: No rash.  Neurological:     Mental Status: She is alert and oriented to person, place, and time.  Psychiatric:        Mood and Affect: Mood normal.        Behavior: Behavior normal.     Wt Readings from Last 3 Encounters:  10/25/21 249 lb (112.9 kg)  09/29/21 240 lb (108.9 kg)  09/10/21 249 lb 12.5 oz (113.3 kg)    BP 136/70   Pulse 62   Ht 5' 5"  (1.651 m)   Wt 249 lb (112.9 kg)   SpO2 96%   BMI 41.44 kg/m   Assessment and Plan: 1. Dysuria UA negative - suspect she may have a sensitivity to Ditropan - POCT Urinalysis Dipstick  2. OAB (overactive bladder) Will try another agent - Toviaz Call if she wants a refill.  If symptoms recur, will refer to Urology. - fesoterodine (TOVIAZ) 4 MG TB24 tablet; Take 1 tablet (4 mg total) by mouth daily.  Dispense: 30 tablet; Refill: 0  3. BMI 40.0-44.9, adult ([facility]) No success with [employer] - Amb Ref to Medical Weight Management  4. Benign essential HTN Clinically stable exam with well controlled BP. Tolerating medications without side effects at this time. Pt to continue current regimen and low sodium diet   Partially dictated using Editor, commissioning. Any errors are unintentional.  Halina Maidens, MD [facility]  10/25/2021

## 2021-10-28 ENCOUNTER — Other Ambulatory Visit: Payer: Self-pay

## 2021-10-28 ENCOUNTER — Encounter: Payer: Self-pay | Admitting: Internal Medicine

## 2021-10-28 ENCOUNTER — Encounter: Payer: Self-pay | Admitting: Cardiovascular Disease

## 2021-10-28 MED ORDER — APIXABAN 5 MG PO TABS: 5.0000 mg | ORAL_TABLET | Freq: Two times a day (BID) | ORAL | 1 refills | Status: DC

## 2021-10-28 NOTE — Telephone Encounter (Signed)
Prescription refill request for Eliquis received. Indication:Afib Last office visit:7/23 Scr:0.7 Age: 66 Weight:112.9 kg  Prescription refilled

## 2021-11-01 DIAGNOSIS — Z0289 Encounter for other administrative examinations: Secondary | ICD-10-CM

## 2021-11-10 ENCOUNTER — Encounter: Payer: Self-pay | Admitting: Internal Medicine

## 2021-11-11 ENCOUNTER — Other Ambulatory Visit: Payer: Self-pay | Admitting: Internal Medicine

## 2021-11-11 DIAGNOSIS — N3281 Overactive bladder: Secondary | ICD-10-CM

## 2021-11-11 MED ORDER — FESOTERODINE FUMARATE ER 4 MG PO TB24: 4.0000 mg | ORAL_TABLET | Freq: Every day | ORAL | 1 refills | Status: DC

## 2021-11-16 ENCOUNTER — Ambulatory Visit (INDEPENDENT_AMBULATORY_CARE_PROVIDER_SITE_OTHER): Payer: Medicare Other | Admitting: Family Medicine

## 2021-11-30 ENCOUNTER — Ambulatory Visit (INDEPENDENT_AMBULATORY_CARE_PROVIDER_SITE_OTHER): Payer: Medicare Other | Admitting: Family Medicine

## 2021-12-15 ENCOUNTER — Encounter: Payer: Self-pay | Admitting: Cardiovascular Disease

## 2022-01-05 ENCOUNTER — Encounter: Payer: Self-pay | Admitting: Internal Medicine

## 2022-02-28 ENCOUNTER — Encounter: Payer: Self-pay | Admitting: Cardiovascular Disease

## 2022-03-01 MED ORDER — APIXABAN 5 MG PO TABS: 5.0000 mg | ORAL_TABLET | Freq: Two times a day (BID) | ORAL | 1 refills | Status: DC

## 2022-03-01 NOTE — Telephone Encounter (Signed)
Prescription refill request for Judith Wood received. Indication:Afib Last office visit:7/23 Scr:0.7 Age: 66 Weight:112.9 kg   Prescription sent to [facility] on [address]

## 2022-03-06 ENCOUNTER — Other Ambulatory Visit: Payer: Self-pay | Admitting: Internal Medicine

## 2022-03-06 DIAGNOSIS — I10 Essential (primary) hypertension: Secondary | ICD-10-CM

## 2022-03-07 NOTE — Telephone Encounter (Signed)
Requested Prescriptions  Pending Prescriptions Disp Refills   diltiazem (DILT-XR) 180 MG 24 hr capsule [Pharmacy Med Name: DILT XR 180 MG CAPSULE] 90 capsule 0    Sig: Take 1 capsule (180 mg total) by mouth daily.     Cardiovascular: Calcium Channel Blockers 3 Failed - 03/06/2022  4:38 AM      Failed - ALT in normal range and within 360 days    ALT  Date Value Ref Range Status  09/12/2021 35 (H) 0 - 32 IU/L Final         Passed - AST in normal range and within 360 days    AST  Date Value Ref Range Status  09/12/2021 36 0 - 40 IU/L Final         Passed - Cr in normal range and within 360 days    Creatinine, Ser  Date Value Ref Range Status  09/12/2021 0.76 0.57 - 1.00 mg/dL Final         Passed - Last BP in normal range    BP Readings from Last 1 Encounters:  10/25/21 136/70         Passed - Last Heart Rate in normal range    Pulse Readings from Last 1 Encounters:  10/25/21 62         Passed - Valid encounter within last 6 months    Recent Outpatient Visits           4 months ago Dysuria   [facility], Jesse Sans, MD   6 months ago Benign essential HTN   [place] [facility], Jesse Sans, MD   1 year ago [place] [facility], Jesse Sans, MD   1 year ago Annual physical exam   [facility] at [facility], Jesse Sans, MD   2 years ago Annual physical exam   [facility] at [facility], Jesse Sans, MD       Future Appointments             In 3 days Army Melia Jesse Sans, MD [place] [facility] at [facility], [facility]

## 2022-03-10 ENCOUNTER — Encounter: Payer: Self-pay | Admitting: Internal Medicine

## 2022-03-10 ENCOUNTER — Ambulatory Visit (INDEPENDENT_AMBULATORY_CARE_PROVIDER_SITE_OTHER): Payer: Medicare Other | Admitting: Internal Medicine

## 2022-03-10 ENCOUNTER — Ambulatory Visit: Payer: Medicare Other | Admitting: Internal Medicine

## 2022-03-10 VITALS — BP 128/78 | HR 75 | Ht 65.0 in | Wt 255.0 lb

## 2022-03-10 DIAGNOSIS — I10 Essential (primary) hypertension: Secondary | ICD-10-CM

## 2022-03-10 DIAGNOSIS — R7303 Prediabetes: Secondary | ICD-10-CM

## 2022-03-10 LAB — POCT GLYCOSYLATED HEMOGLOBIN (HGB A1C): Hemoglobin A1C: 5.7 % — AB (ref 4.0–5.6)

## 2022-03-10 MED ORDER — DILTIAZEM HCL ER 180 MG PO CP24: 180.0000 mg | ORAL_CAPSULE | Freq: Every day | ORAL | 1 refills | Status: DC

## 2022-03-10 NOTE — Assessment & Plan Note (Addendum)
A1C 6.0 in 2023 Recommend diet changes and recheck today A1c 5.7 today She plans to get back in touch with [facility]

## 2022-03-10 NOTE — Assessment & Plan Note (Signed)
Clinically stable exam with well controlled BP. Tolerating medications without side effects at this time. Pt to continue current regimen and low sodium diet

## 2022-03-10 NOTE — Progress Notes (Signed)
Date:  03/10/2022   Name:  Judith Wood   DOB:  March 22, 1955   MRN:  [id number]   Chief Complaint: Hypertension and Diabetes  Hypertension This is a chronic problem. The problem is controlled. Pertinent negatives include no chest pain, headaches, palpitations or shortness of breath. Past treatments include beta blockers and calcium channel blockers. There is no history of CVA.  Diabetes She presents for her follow-up diabetic visit. Diabetes type: prediabetes. Her disease course has been stable. Pertinent negatives for hypoglycemia include no dizziness or headaches. Pertinent negatives for diabetes include no chest pain, no fatigue and no weakness. Diabetic complications include heart disease (Afib intermittent). Pertinent negatives for diabetic complications include no CVA or nephropathy. An ACE inhibitor/angiotensin II receptor blocker is not being taken.  She is frustrated with her lack of motivation to lose weight.  She was going to [facility] but had an issue with payment so did not follow through.  She has tried weight watchers with limited benefit in the past.  Considered revision gastric bypass.  Lab Results  Component Value Date   NA 135 09/12/2021   K 4.9 09/12/2021   CO2 24 09/12/2021   GLUCOSE 98 09/12/2021   BUN 18 09/12/2021   CREATININE 0.76 09/12/2021   CALCIUM 9.2 09/12/2021   EGFR 87 09/12/2021   GFRNONAA 78 03/12/2021   Lab Results  Component Value Date   CHOL 145 09/12/2021   HDL 43 09/12/2021   LDLCALC 78 09/12/2021   TRIG 137 09/12/2021   CHOLHDL 3.4 09/12/2021   Lab Results  Component Value Date   TSH 2.730 09/12/2021   Lab Results  Component Value Date   HGBA1C 5.7 (A) 03/10/2022   Lab Results  Component Value Date   WBC 5.0 09/12/2021   HGB 12.2 09/12/2021   HCT 38.5 09/12/2021   MCV 83 09/12/2021   PLT 344 09/12/2021   Lab Results  Component Value Date   ALT 35 (H) 09/12/2021   AST 36 09/12/2021   ALKPHOS 87 09/12/2021   BILITOT 0.4  09/12/2021   Lab Results  Component Value Date   VD25OH 67 03/12/2021     Review of Systems  Constitutional:  Negative for fatigue and unexpected weight change.  HENT:  Negative for nosebleeds.   Eyes:  Negative for visual disturbance.  Respiratory:  Negative for cough, chest tightness, shortness of breath and wheezing.   Cardiovascular:  Negative for chest pain, palpitations and leg swelling.  Gastrointestinal:  Negative for abdominal pain, constipation and diarrhea.  Neurological:  Negative for dizziness, weakness, light-headedness and headaches.    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   Vitamin D deficiency 01/18/2016    Allergies  Allergen Reactions   Norco [Hydrocodone-Acetaminophen] Itching   Tetracyclines & Related Itching and Rash    Past Surgical History:  Procedure Laterality Date   CHOLECYSTECTOMY     COLONOSCOPY WITH PROPOFOL N/A 09/29/2021   Procedure: COLONOSCOPY WITH PROPOFOL;  Surgeon: Jonathon Bellows, MD;  Location: [facility] ENDOSCOPY;  Service: Gastroenterology;  Laterality: N/A;   ROUX-EN-Y GASTRIC BYPASS  2009  TOTAL ABDOMINAL HYSTERECTOMY  2000   WRIST SURGERY      Social History   Tobacco Use   Smoking status: Never   Smokeless tobacco: Never  Vaping Use   Vaping Use: Never used  Substance Use Topics   Alcohol use: No   Drug use: No     Medication list has been reviewed and updated.  Current Meds  Medication Sig   apixaban (ELIQUIS) 5 MG TABS tablet Take 1 tablet (5 mg total) by mouth 2 (two) times daily.    b complex vitamins tablet Take 1 tablet by mouth daily.   Calcium Citrate-Vitamin D (CALCIUM CITRATE + D PO) Take 2 tablets by mouth daily.   CHOLECALCIFEROL PO Take 5,000 Units by mouth daily.   D-MANNOSE PO Take 1 capsule by mouth daily.   Fe Bisgly-Vit C-Vit B12-FA (GENTLE IRON PO) Take 25 mg by mouth in the morning and at bedtime.   flecainide (TAMBOCOR) 150 MG tablet Take 1 tablet (150 mg total) by mouth 2 (two) times daily as needed.   glucosamine-chondroitin 500-400 MG tablet Take 1 tablet by mouth 3 (three) times daily.   magnesium 30 MG tablet Take 30 mg by mouth 2 (two) times daily.   metoprolol succinate (TOPROL-XL) 25 MG 24 hr tablet Take 1 tablet (25 mg total) by mouth daily. Take with or immediately following a meal.   [DISCONTINUED] diltiazem (DILT-XR) 180 MG 24 hr capsule Take 1 capsule (180 mg total) by mouth daily.       09/07/2021   10:51 AM 12/29/2020    1:52 PM 04/05/2020    8:43 AM  GAD 7 : Generalized Anxiety Score  Nervous, Anxious, on Edge 0 0 0  Control/stop worrying 0 0 0  Worry too much - different things 0 0 0  Trouble relaxing 0 0 0  Restless 0 0 0  Easily annoyed or irritable 0 0 0  Afraid - awful might happen 0 0 0  Total GAD 7 Score 0 0 0  Anxiety Difficulty Not difficult at all Not difficult at all        09/07/2021   10:51 AM 12/29/2020    1:52 PM 04/05/2020    8:43 AM  Depression screen PHQ 2/9  Decreased Interest 1 1 0  Down, Depressed, Hopeless 1 0 0  PHQ - 2 Score 2 1 0  Altered sleeping 1 0 0  Tired, decreased energy 1 1 0  Change in appetite 0 1 0  Feeling bad or failure about yourself  0 0 0  Trouble concentrating 0 0 0  Moving slowly or fidgety/restless 0 0 0  Suicidal thoughts 0 0 0  PHQ-9 Score 4 3 0  Difficult doing work/chores Not difficult at all Not difficult at all     BP Readings from Last 3 Encounters:  03/10/22 128/78  10/25/21 136/70  09/29/21 138/75    Physical Exam Vitals and nursing note reviewed.   Constitutional:      General: She is not in acute distress.    Appearance: Normal appearance. She is well-developed.  HENT:     Head: Normocephalic and atraumatic.  Cardiovascular:     Rate and Rhythm: Normal rate and regular rhythm.  Pulmonary:     Effort: Pulmonary effort is normal. No respiratory distress.     Breath sounds: No wheezing or rhonchi.  Musculoskeletal:        General: Normal range of motion.     Cervical back: Normal range of motion.  Right lower leg: No edema.     Left lower leg: No edema.  Skin:    General: Skin is warm and dry.     Capillary Refill: Capillary refill takes less than 2 seconds.     Findings: No rash.  Neurological:     Mental Status: She is alert and oriented to person, place, and time.  Psychiatric:        Mood and Affect: Mood normal.        Behavior: Behavior normal.     Wt Readings from Last 3 Encounters:  03/10/22 255 lb (115.7 kg)  10/25/21 249 lb (112.9 kg)  09/29/21 240 lb (108.9 kg)    BP 128/78   Pulse 75   Ht '5\' 5"'$  (1.651 m)   Wt 255 lb (115.7 kg)   SpO2 95%   BMI 42.43 kg/m   Assessment and Plan: Problem List Items Addressed This Visit       Cardiovascular and Mediastinum   Benign essential HTN - Primary (Chronic)    Clinically stable exam with well controlled BP. Tolerating medications without side effects at this time. Pt to continue current regimen and low sodium diet      Relevant Medications   diltiazem (DILT-XR) 180 MG 24 hr capsule     Other   Prediabetes    A1C 6.0 in 2023 Recommend diet changes and recheck today A1c 5.7 today She plans to get back in touch with [facility]      Relevant Orders   POCT glycosylated hemoglobin (Hb A1C) (Completed)     Partially dictated using Editor, commissioning. Any errors are unintentional.  Halina Maidens, MD [facility]  03/10/2022

## 2022-04-11 ENCOUNTER — Ambulatory Visit (INDEPENDENT_AMBULATORY_CARE_PROVIDER_SITE_OTHER): Payer: Medicare Other

## 2022-04-11 ENCOUNTER — Ambulatory Visit
Admission: RE | Admit: 2022-04-11 | Discharge: 2022-04-11 | Disposition: A | Discharge status: Home / Self Care | Payer: Medicare Other | Source: Ambulatory Visit | Attending: Emergency Medicine | Admitting: Emergency Medicine

## 2022-04-11 VITALS — BP 141/88 | HR 65 | Temp 98.7°F | Ht 65.0 in | Wt 250.0 lb

## 2022-04-11 DIAGNOSIS — J189 Pneumonia, unspecified organism: Secondary | ICD-10-CM | POA: Diagnosis not present

## 2022-04-11 MED ORDER — PROMETHAZINE-DM 6.25-15 MG/5ML PO SYRP: 5.0000 mL | ORAL_SOLUTION | Freq: Four times a day (QID) | ORAL | 0 refills | Status: DC | PRN

## 2022-04-11 MED ORDER — BENZONATATE 100 MG PO CAPS: 200.0000 mg | ORAL_CAPSULE | Freq: Three times a day (TID) | ORAL | 0 refills | Status: DC

## 2022-04-11 MED ORDER — LEVOFLOXACIN 500 MG PO TABS: 500.0000 mg | ORAL_TABLET | Freq: Every day | ORAL | 0 refills | Status: DC

## 2022-04-11 MED ORDER — IPRATROPIUM BROMIDE 0.06 % NA SOLN: 2.0000 | Freq: Four times a day (QID) | NASAL | 12 refills | Status: DC

## 2022-04-11 NOTE — ED Provider Notes (Signed)
[facility]    CSN: [id number] Arrival date & time: 04/11/22  1249      History   Chief Complaint Chief Complaint  Patient presents with   Cough    I've had respiratory cough, snotty nose for 1 1/2 weeks. Hurts to breathe in. - Entered by patient    HPI Judith Wood is a 67 y.o. female.   HPI  67 year old female here for evaluation of respiratory complaints.  The patient reports that for the last week and a half she has been experiencing runny nose with nasal congestion and clear nasal discharge, and a cough that is intermittently productive for green sputum.  She denies any fever, shortness of breath, or wheezing.  She states that the outset she has some sinus pressure but that has resolved.  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      Home Medications    Prior to Admission medications   Medication Sig Start Date End Date Taking? Authorizing Provider  apixaban  (ELIQUIS) 5 MG TABS tablet Take 1 tablet (5 mg total) by mouth 2 (two) times daily. 03/01/22  Yes Minna Merritts, MD  b complex vitamins tablet Take 1 tablet by mouth daily.   Yes [provider]  benzonatate (TESSALON) 100 MG capsule Take 2 capsules (200 mg total) by mouth every 8 (eight) hours. 04/11/22  Yes Margarette Canada, NP  Calcium Citrate-Vitamin D (CALCIUM CITRATE + D PO) Take 2 tablets by mouth daily.   Yes [provider]  CHOLECALCIFEROL PO Take 5,000 Units by mouth daily.   Yes [provider]  D-MANNOSE PO Take 1 capsule by mouth daily.   Yes [provider]  diltiazem (DILT-XR) 180 MG 24 hr capsule Take 1 capsule (180 mg total) by mouth daily. 03/10/22  Yes Glean Hess, MD  Fe Bisgly-Vit C-Vit B12-FA (GENTLE IRON PO) Take 25 mg by mouth in the morning and at bedtime.   Yes [provider]  flecainide (TAMBOCOR) 150 MG tablet Take 1 tablet (150 mg total) by mouth 2 (two) times daily as needed. 09/09/21  Yes Gollan, Kathlene November, MD  glucosamine-chondroitin 500-400 MG tablet Take 1 tablet by mouth 3 (three) times daily.   Yes [provider]  ipratropium (ATROVENT) 0.06 % nasal spray Place 2 sprays into both nostrils 4 (four) times daily. 04/11/22  Yes Margarette Canada, NP  levofloxacin (LEVAQUIN) 500 MG tablet Take 1 tablet (500 mg  total) by mouth daily. 04/11/22  Yes Margarette Canada, NP  magnesium 30 MG tablet Take 30 mg by mouth 2 (two) times daily.   Yes [provider]  metoprolol succinate (TOPROL-XL) 25 MG 24 hr tablet Take 1 tablet (25 mg total) by mouth daily. Take with or immediately following a meal. 09/09/21  Yes Gollan, Kathlene November, MD  promethazine-dextromethorphan (PROMETHAZINE-DM) 6.25-15 MG/5ML syrup Take 5 mLs by mouth 4 (four) times daily as needed. 04/11/22  Yes Margarette Canada, NP    Family History Family History  Problem Relation Age of Onset   Hypertension Mother    Arrhythmia Mother        Pacemaker implant    Heart  attack Father 27   Arrhythmia Sister    Arrhythmia Brother    Arrhythmia Brother    Breast cancer Neg Hx     Social History Social History   Tobacco Use   Smoking status: Never   Smokeless tobacco: Never  Vaping Use   Vaping Use: Never used  Substance Use Topics   Alcohol use: No   Drug use: No     Allergies   Norco [hydrocodone-acetaminophen] and Tetracyclines & related   Review of Systems Review of Systems  Constitutional:  Negative for fever.  HENT:  Positive for congestion and rhinorrhea. Negative for sinus pressure.   Respiratory:  Positive for cough. Negative for shortness of breath and wheezing.      Physical Exam Triage Vital Signs ED Triage Vitals  Enc Vitals Group     BP 04/11/22 1336 (!) 141/88     Pulse Rate 04/11/22 1336 65     Resp --      Temp 04/11/22 1336 98.7 F (37.1 C)     Temp Source 04/11/22 1336 Oral     SpO2 04/11/22 1336 94 %     Weight 04/11/22 1335 250 lb (113.4 kg)     Height 04/11/22 1335 '5\' 5"'$  (1.651 m)     Head Circumference --      Peak Flow --      Pain Score 04/11/22 1335 2     Pain Loc --      Pain Edu? --      Excl. in [place]? --    No data found.  Updated Vital Signs BP (!) 141/88 (BP Location: Left Arm)   Pulse 65   Temp 98.7 F (37.1 C) (Oral)   Ht '5\' 5"'$  (1.651 m)   Wt 250 lb (113.4 kg)   SpO2 94%   BMI 41.60 kg/m   Visual Acuity Right Eye Distance:   Left Eye Distance:   Bilateral Distance:    Right Eye Near:   Left Eye Near:    Bilateral Near:     Physical Exam Vitals and nursing note reviewed.  Constitutional:      Appearance: Normal appearance. She is not ill-appearing.  HENT:     Head: Normocephalic and atraumatic.     Right Ear: Tympanic membrane, ear canal and external ear normal. There is no impacted cerumen.     Left Ear: Tympanic membrane, ear canal and external ear normal. There is no impacted cerumen.     Nose: Congestion and rhinorrhea present.     Comments: Nasal mucosa is erythematous  and edematous with scant clear discharge in both nares.    Mouth/Throat:     Mouth: Mucous membranes are moist.     Pharynx: Oropharynx is clear. Posterior oropharyngeal erythema present. No oropharyngeal exudate.  Comments: Mild erythema in the posterior oropharynx.  No injection.  Mild clear postnasal drip present. Cardiovascular:     Rate and Rhythm: Normal rate and regular rhythm.     Pulses: Normal pulses.     Heart sounds: Normal heart sounds. No murmur heard.    No friction rub. No gallop.  Pulmonary:     Effort: Pulmonary effort is normal.     Breath sounds: Rhonchi present. No wheezing or rales.     Comments: Patient has coarse lung sounds in the left lower lobe posteriorly. Musculoskeletal:     Cervical back: Normal range of motion and neck supple.  Lymphadenopathy:     Cervical: No cervical adenopathy.  Skin:    General: Skin is warm and dry.     Capillary Refill: Capillary refill takes less than 2 seconds.     Findings: No erythema or rash.  Neurological:     General: No focal deficit present.     Mental Status: She is alert and oriented to person, place, and time.  Psychiatric:        Mood and Affect: Mood normal.        Behavior: Behavior normal.        Thought Content: Thought content normal.        Judgment: Judgment normal.      UC Treatments / Results  Labs (all labs ordered are listed, but only abnormal results are displayed) Labs Reviewed - No data to display  EKG   Radiology DG Chest 2 View  Result Date: 04/11/2022 CLINICAL DATA:  cough For 1.5 weeks. EXAM: CHEST - 2 VIEW COMPARISON:  August 12, 2020 FINDINGS: The cardiomediastinal silhouette is unchanged in contour. No pleural effusion. No pneumothorax. Small nodular opacity in the LEFT mid lung, nonspecific. No focal consolidation. Visualized abdomen is unremarkable. Multilevel degenerative changes of the thoracic spine. IMPRESSION: Small nodular opacity in the LEFT mid lung, nonspecific. This could  reflect atelectasis, scarring or a small focus of infection. Followup PA and lateral chest X-ray is recommended in 3-4 weeks following appropriate treatment to ensure resolution and exclude underlying malignancy. Electronically Signed   By: Valentino Saxon M.D.   On: 04/11/2022 14:16    Procedures Procedures (including critical care time)  Medications Ordered in UC Medications - No data to display  Initial Impression / Assessment and Plan / UC Course  I have reviewed the triage vital signs and the nursing notes.  Pertinent labs & imaging results that were available during my care of the patient were reviewed by me and considered in my medical decision making (see chart for details).   Patient is a pleasant, nontoxic-appearing 67 year old female who has a significant past medical history to include paroxysmal A-fib, hyperlipidemia, status post Roux-en-Y bypass, benign hypertension, prediabetes, and microcytic anemia presenting for evaluation of 1.5 weeks of respiratory symptoms as outlined in HPI above.  The patient is in no acute distress and she is able to speak in full sentence without dyspnea or tachypnea.  There is no significant nasal discharge or congestion noted on exam.  She does have coarse lung sounds in the left lower lobe posteriorly so I will obtain a chest x-ray to look for any acute cardiopulmonary abnormality.  Radiology impression of chest x-ray states there is a small nodule opacity in the left midlung that is nonspecific.  Could reflect atelectasis, scarring, or small focus of infection.  Recommend follow-up two-view chest in 3 to 4 weeks to ensure resolution and exclude underlying  malignancy following appropriate treatment.  I will treat patient for community-acquired pneumonia with 500 Levaquin once daily for 7 days.  She is denying shortness breath and wheezing so not prescribed an albuterol [place] at this time.  I will prescribe Atrovent nasal spray to open the nasal  congestion and Tessalon Perles to help with cough and congestion along with Promethazine DM cough syrup she can use at bedtime.   Final Clinical Impressions(s) / UC Diagnoses   Final diagnoses:  Community acquired pneumonia of left lower lobe of lung     Discharge Instructions      Your chest x-ray showed a small opacity in your left midlung which may represent a pneumonia.  I am going to treat you for pneumonia with Levaquin 500 mg once daily for 7 days.  Use the Tessalon Perles every 8 hours during the day as needed for cough.  Take them with a small sip of water.  They may give you a numbness to the base of your tongue or metallic taste in her mouth, this is normal.  Use the Atrovent nasal spray, 2 squirts of each nostril every 6 hours, as needed for nasal congestion and runny nose.  Use the Promethazine DM cough syrup at bedtime as needed for cough and congestion.  Be mindful this medication will make you drowsy.  If you have to get up and use the bath the middle night make sure you have your bearings about you before you try and walk.  You will need a follow-up chest x-ray in 3 to 4 weeks to ensure resolution of this infection.     ED Prescriptions     Medication Sig Dispense Auth. Provider   benzonatate (TESSALON) 100 MG capsule Take 2 capsules (200 mg total) by mouth every 8 (eight) hours. 21 capsule Margarette Canada, NP   ipratropium (ATROVENT) 0.06 % nasal spray Place 2 sprays into both nostrils 4 (four) times daily. 15 mL Margarette Canada, NP   promethazine-dextromethorphan (PROMETHAZINE-DM) 6.25-15 MG/5ML syrup Take 5 mLs by mouth 4 (four) times daily as needed. 118 mL Margarette Canada, NP   levofloxacin (LEVAQUIN) 500 MG tablet Take 1 tablet (500 mg total) by mouth daily. 7 tablet Margarette Canada, NP      PDMP not reviewed this encounter.   Margarette Canada, NP 04/11/22 1432

## 2022-04-11 NOTE — Discharge Instructions (Addendum)
Your chest x-ray showed a small opacity in your left midlung which may represent a pneumonia.  I am going to treat you for pneumonia with Levaquin 500 mg once daily for 7 days.  Use the Tessalon Perles every 8 hours during the day as needed for cough.  Take them with a small sip of water.  They may give you a numbness to the base of your tongue or metallic taste in her mouth, this is normal.  Use the Atrovent nasal spray, 2 squirts of each nostril every 6 hours, as needed for nasal congestion and runny nose.  Use the Promethazine DM cough syrup at bedtime as needed for cough and congestion.  Be mindful this medication will make you drowsy.  If you have to get up and use the bath the middle night make sure you have your bearings about you before you try and walk.  You will need a follow-up chest x-ray in 3 to 4 weeks to ensure resolution of this infection.

## 2022-04-11 NOTE — ED Triage Notes (Signed)
Pt c/o cough states I've had respiratory cough, snotty nose for 1 1/2 weeks. Hurts to breathe in. - Entered by patient

## 2022-04-12 ENCOUNTER — Telehealth: Payer: Medicare Other | Admitting: Internal Medicine

## 2022-05-15 ENCOUNTER — Encounter: Payer: Self-pay | Admitting: Cardiovascular Disease

## 2022-05-15 ENCOUNTER — Telehealth: Payer: Self-pay | Admitting: Cardiovascular Disease

## 2022-05-15 ENCOUNTER — Ambulatory Visit: Payer: Medicare Other | Attending: Cardiovascular Disease | Admitting: Cardiovascular Disease

## 2022-05-15 VITALS — BP 120/80 | HR 64 | Ht 65.0 in | Wt 252.5 lb

## 2022-05-15 DIAGNOSIS — E782 Mixed hyperlipidemia: Secondary | ICD-10-CM | POA: Insufficient documentation

## 2022-05-15 DIAGNOSIS — I10 Essential (primary) hypertension: Secondary | ICD-10-CM | POA: Insufficient documentation

## 2022-05-15 DIAGNOSIS — I480 Paroxysmal atrial fibrillation: Secondary | ICD-10-CM | POA: Insufficient documentation

## 2022-05-15 MED ORDER — FLECAINIDE ACETATE 100 MG PO TABS: 100.0000 mg | ORAL_TABLET | Freq: Two times a day (BID) | ORAL | 3 refills | Status: DC

## 2022-05-15 MED ORDER — METOPROLOL SUCCINATE ER 50 MG PO TB24: 50.0000 mg | ORAL_TABLET | Freq: Every day | ORAL | 3 refills | Status: DC

## 2022-05-15 NOTE — Telephone Encounter (Signed)
Scheduled patient to see Dr. Rockey Situ on 3/11 at 4:00 pm

## 2022-05-15 NOTE — Patient Instructions (Addendum)
Flecainide treadmill in 3 weeks   Medication Instructions:  Please increase the metoprolol succinate up to 50 mg daily Please start flecainide 100 mg twice a day   If you need a refill on your cardiac medications before your next appointment, please call your pharmacy.   Lab work: No new labs needed  Testing/Procedures: Your provider has ordered a exercise tolerance test. This test will evaluate the blood supply to your heart muscle during periods of exercise and rest. For this test, you will raise your heart rate by walking on a treadmill at different levels.   you may eat a light breakfast/ lunch prior to your procedure no caffeine for 24 hours prior to your test (coffee, tea, soft drinks, or chocolate)  no smoking/ vaping for 4 hours prior to your test you may take your regular medications the day of your test  bring any inhalers with you to your test wear comfortable clothing & tennis/ non-skid shoes to walk on the treadmill  This will take place at [place] ([id number], [place]   Follow-Up: At [facility], you and your health needs are our priority.  As part of our continuing mission to provide you with exceptional heart care, we have created designated Provider Care Teams.  These Care Teams include your primary Cardiologist (physician) and Advanced Practice Providers (APPs -  Physician Assistants and Nurse Practitioners) who all work together to provide you with the care you need, when you need it.  You will need a follow up appointment in 3 months  Providers on your designated Care Team:   Murray Hodgkins, NP Christell Faith, PA-C Cadence Kathlen Mody, [place]  COVID-19 Vaccine Information can be found at: [email or web address] For questions related to vaccine distribution or appointments, please email [email or web address] or call [email or web address].

## 2022-05-15 NOTE — Progress Notes (Signed)
Cardiology Office Note  Date:  05/15/2022   ID:  Judith Wood, DOB 15-Nov-1955, MRN [id number]  PCP:  Glean Hess, MD   Chief Complaint  Patient presents with   Atrial Fibrillation    Patient c/o more frequent episodes of A-Fib. Medications reviewed by the patient verbally.     HPI:  [name] is a 67 year old woman with past medical history of paroxysmal atrial fibrillation by outside cardiologist [place] Hyperlipidemia Hypertension History of smoking A. Fib, CHADS VASC 2, not on anticoagulation secondary to no insurance Who presents for follow-up of her paroxysmal atrial fibrillation, COVID infection  Last seen in clinic July 2023 Afib spells 2x a week past few months Has been taking frequent episodes of flecainide for breakthrough atrial fibrillation with propranolol  Middle of the night having episodes Does not have OSA  Takes diltiazem ER 180 daily, metoprolol succinate 25 Compliant with Eliquis 5 twice daily  EKG personally reviewed by myself on todays visit Normal sinus rhythm rate 64 bpm no significant ST-T wave changes QTc 443  Other past medical history reviewed ZIO monitor reviewed Patient had a min HR of 46 bpm, max HR of 175 bpm, and avg HR of 65 bpm.  Predominant underlying rhythm was Sinus Rhythm.    1 run of Ventricular Tachycardia occurred lasting 5 beats with a max rate of 135 bpm (avg 127 bpm).    Atrial Fibrillation/Flutter occurred (2% burden), ranging from 50-175 bpm (avg of 74 bpm), the longest lasting 6 hours 9 mins with an avg rate of 74 bpm.  Atrial Fibrillation/Flutter was detected within +/- 45 seconds of symptomatic patient event(s).    Isolated SVEs were rare (<1.0%),SVE Couplets were rare (<1.0%), and SVE Triplets were rare (<1.0%). Isolated VEs were rare (<1.0%, 874), VE Couplets were rare (<1.0%, 7), and VE Triplets were rare (<1.0%, 1). Ventricular Trigeminy was present.    COVID end of May 2022, worsening symptoms into  June Went to the ER August 12, 2020 EKG showed normal sinus rhythm  Echocardiogram August 2019 NORMAL LEFT VENTRICULAR SYSTOLIC FUNCTION   WITH MILD LVH  NORMAL RIGHT VENTRICULAR SYSTOLIC FUNCTION  MILD VALVULAR REGURGITATION (See above)  NO VALVULAR STENOSIS  TRIVIAL MR  MILD TR  EF 50%   Outside stress test August 2019, treadmill only  PMH:   has a past medical history of Abnormal mammogram of right breast (01/18/2016), Afib ([place]), Dysrhythmia, and Hypertension.  PSH:    Past Surgical History:  Procedure Laterality Date   CHOLECYSTECTOMY     COLONOSCOPY WITH PROPOFOL N/A 09/29/2021   Procedure: COLONOSCOPY WITH PROPOFOL;  Surgeon: Jonathon Bellows, MD;  Location[facility] ENDOSCOPY;  Service: Gastroenterology;  Laterality: N/A;   ROUX-EN-Y GASTRIC BYPASS  2009   TOTAL ABDOMINAL HYSTERECTOMY  2000   WRIST SURGERY      Current Outpatient Medications  Medication Sig Dispense Refill   apixaban (ELIQUIS) 5 MG TABS tablet Take 1 tablet (5 mg total) by mouth 2 (two) times daily. 180 tablet 1   b complex vitamins tablet Take 1 tablet by mouth daily.     Calcium Citrate-Vitamin D (CALCIUM CITRATE + D PO) Take 2 tablets by mouth daily.     CHOLECALCIFEROL PO Take 5,000 Units by mouth daily.     D-MANNOSE PO Take 1 capsule by mouth daily.     diltiazem (DILT-XR) 180 MG 24 hr capsule Take 1 capsule (180 mg total) by mouth daily. 90 capsule 1   Fe Bisgly-Vit C-Vit B12-FA (GENTLE IRON  PO) Take 25 mg by mouth in the morning and at bedtime.     flecainide (TAMBOCOR) 150 MG tablet Take 1 tablet (150 mg total) by mouth 2 (two) times daily as needed. 90 tablet 3   glucosamine-chondroitin 500-400 MG tablet Take 1 tablet by mouth 3 (three) times daily.     magnesium 30 MG tablet Take 30 mg by mouth 2 (two) times daily.     metoprolol succinate (TOPROL-XL) 25 MG 24 hr tablet Take 1 tablet (25 mg total) by mouth daily. Take with or immediately following a meal. 90 tablet 3   No current facility-administered  medications for this visit.    Allergies:   Norco [hydrocodone-acetaminophen] and Tetracyclines & related   Social History:  The patient  reports that she has never smoked. She has never used smokeless tobacco. She reports that she does not drink alcohol and does not use drugs.   Family History:   family history includes Arrhythmia in her brother, brother, mother, and sister; Heart attack (age of onset: 85) in her father; Hypertension in her mother.   Review of Systems: Review of Systems  Constitutional: Negative.   HENT: Negative.    Respiratory: Negative.    Cardiovascular:  Positive for palpitations.  Gastrointestinal: Negative.   Musculoskeletal: Negative.   Neurological: Negative.   Psychiatric/Behavioral: Negative.    All other systems reviewed and are negative.  PHYSICAL EXAM: VS:  BP 120/80 (BP Location: Left Arm, Patient Position: Sitting, Cuff Size: Normal)   Pulse 64   Ht '5\' 5"'$  (1.651 m)   Wt 252 lb 8 oz (114.5 kg)   SpO2 97%   BMI 42.02 kg/m  , BMI Body mass index is 42.02 kg/m. Constitutional:  oriented to person, place, and time. No distress.  HENT:  Head: Grossly normal Eyes:  no discharge. No scleral icterus.  Neck: No JVD, no carotid bruits  Cardiovascular: Regular rate and rhythm, no murmurs appreciated Pulmonary/Chest: Clear to auscultation bilaterally, no wheezes or rails Abdominal: Soft.  no distension.  no tenderness.  Musculoskeletal: Normal range of motion Neurological:  normal muscle tone. Coordination normal. No atrophy Skin: Skin warm and dry Psychiatric: normal affect, pleasant  Recent Labs: 09/12/2021: ALT 35; BUN 18; Creatinine, Ser 0.76; Hemoglobin 12.2; Platelets 344; Potassium 4.9; Sodium 135; TSH 2.730    Lipid Panel Lab Results  Component Value Date   CHOL 145 09/12/2021   HDL 43 09/12/2021   LDLCALC 78 09/12/2021   TRIG 137 09/12/2021      Wt Readings from Last 3 Encounters:  05/15/22 252 lb 8 oz (114.5 kg)  04/11/22 250 lb  (113.4 kg)  03/10/22 255 lb (115.7 kg)     ASSESSMENT AND PLAN:  Atrial fibrillation, paroxysmal Continues to have paroxysmal episodes of atrial fibrillation Recommend she continue Eliquis 5 twice daily Continue current dose diltiazem ER 180 daily, increase metoprolol succinate up to 50 daily Recommend she start flecainide 100 twice daily Flecainide treadmill in several weeks time  HTN: Higher dose metoprolol as above, continue current dose diltiazem In general blood pressure relatively well-controlled   Total encounter time more than 30 minutes  Greater than 50% was spent in counseling and coordination of care with the patient    Signed, Esmond Plants, M.D., Ph.D. [place], [place]

## 2022-05-23 ENCOUNTER — Ambulatory Visit: Payer: Medicare Other

## 2022-06-12 ENCOUNTER — Ambulatory Visit
Admission: RE | Admit: 2022-06-12 | Discharge: 2022-06-12 | Disposition: A | Discharge status: Home / Self Care | Payer: Medicare Other | Source: Ambulatory Visit | Attending: Cardiovascular Disease | Admitting: Cardiovascular Disease

## 2022-06-12 DIAGNOSIS — I10 Essential (primary) hypertension: Secondary | ICD-10-CM

## 2022-06-12 DIAGNOSIS — I480 Paroxysmal atrial fibrillation: Secondary | ICD-10-CM | POA: Diagnosis not present

## 2022-06-12 LAB — EXERCISE TOLERANCE TEST
Angina Index: 0
Base ST Depression (mm): 0 mm
Duke Treadmill Score: 5
Estimated workload: 4.6
Exercise duration (min): 5 min
Exercise duration (sec): 0 s
MPHR: 155 {beats}/min
Peak HR: 101 {beats}/min
Percent HR: 65 %
Rest HR: 61 {beats}/min
ST Depression (mm): 0 mm

## 2022-08-06 ENCOUNTER — Other Ambulatory Visit: Payer: Self-pay | Admitting: Cardiovascular Disease

## 2022-08-07 NOTE — Telephone Encounter (Signed)
Refill request

## 2022-08-07 NOTE — Telephone Encounter (Signed)
Prescription refill request for Eliquis received. Indication:afib Last office visit:3/24 Scr:0.76  7/23 Age: 67 Weight:114.5  kg  Prescription refilled

## 2022-08-16 ENCOUNTER — Ambulatory Visit: Payer: Self-pay

## 2022-08-16 NOTE — Telephone Encounter (Signed)
    Chief Complaint: Both eyes burning, irritated, eye lids too. Itching Symptoms: Above Frequency: any pain Pertinent Negatives: Patient denies any pain. Disposition: [] ED /[] Urgent Care (no appt availability in office) / [x] Appointment(In office/virtual)/ []  [place] [facility]/ [] Home Care/ [] Refused Recommended Disposition /[] [place] [facility]/ []  Follow-up with PCP Additional Notes: No availability with PCP.  Reason for Disposition  Eye pain present > 24 hours  Answer Assessment - Initial Assessment Questions 1. ONSET: "When did the pain start?" (e.g., minutes, hours, days)     2 weeks 2. TIMING: "Does the pain come and go, or has it been constant since it started?" (e.g., constant, intermittent, fleeting)     Comes and goes 3. SEVERITY: "How bad is the pain?"   (Scale 1-10; mild, moderate or severe)   - MILD (1-3): doesn't interfere with normal activities    - MODERATE (4-7): interferes with normal activities or awakens from sleep    - SEVERE (8-10): excruciating pain and patient unable to do normal activities     Mild-moderate - burning 4. LOCATION: "Where does it hurt?"  (e.g., eyelid, eye, cheekbone)     Both eyes and lids 5. CAUSE: "What do you think is causing the pain?"     Unsure 6. VISION: "Do you have blurred vision or changes in your vision?"      No 7. EYE DISCHARGE: "Is there any discharge (pus) from the eye(s)?"  If Yes, ask: "What color is it?"      No 8. FEVER: "Do you have a fever?" If Yes, ask: "What is it, how was it measured, and when did it start?"      No 9. OTHER SYMPTOMS: "Do you have any other symptoms?" (e.g., headache, nasal discharge, facial rash)     No 10. PREGNANCY: "Is there any chance you are pregnant?" "When was your last menstrual period?"       No  Protocols used: Eye Pain and Other Symptoms-A-AH

## 2022-08-17 ENCOUNTER — Ambulatory Visit: Payer: Medicare Other | Admitting: Physician Assistant

## 2022-08-21 NOTE — Progress Notes (Signed)
Cardiology Office Note  Date:  08/22/2022   ID:  Judith Wood, DOB 1955-12-03, MRN [id number]  PCP:  Judith Milan, MD   Chief Complaint  Patient presents with   3 month follow up     "Doing well." Patient c/o heart rate running in the 40's during the night. Medications reviewed by the patient verbally.     HPI:  [name] is a 67 year old woman with past medical history of paroxysmal atrial fibrillation by outside cardiologist kernodle Hyperlipidemia Hypertension History of smoking A. Fib, CHADS VASC 2,  Who presents for follow-up of her paroxysmal atrial fibrillation, COVID infection  Last seen in clinic March 2024 Afib spells 2x a week at the time Was taking propranolol and flecainide as needed for breakthrough atrial fibrillation  On last clinic visit, Flecainide dosing changed to 100 twice daily with diltiazem ER 180 daily metoprolol succinate 50 daily  Denies significant episodes of atrial fibrillation since that time Compliant with Eliquis 5 twice daily Concerned about low heart rate at night into the 40s Also concerned low heart rate may be affecting her metabolism  Working with weight watchers, has changed her diet, weight slowly trending downward  Lab work reviewed A1C 5.7  EKG personally reviewed by myself on todays visit Normal sinus rhythm rate 56 bpm no significant ST-T wave changes QTc 451, QRS duration 100 ms  Other past medical history reviewed ZIO monitor reviewed Patient had a min HR of 46 bpm, max HR of 175 bpm, and avg HR of 65 bpm.  Predominant underlying rhythm was Sinus Rhythm.    1 run of Ventricular Tachycardia occurred lasting 5 beats with a max rate of 135 bpm (avg 127 bpm).    Atrial Fibrillation/Flutter occurred (2% burden), ranging from 50-175 bpm (avg of 74 bpm), the longest lasting 6 hours 9 mins with an avg rate of 74 bpm.  Atrial Fibrillation/Flutter was detected within +/- 45 seconds of symptomatic patient event(s).     Isolated SVEs were rare (<1.0%),SVE Couplets were rare (<1.0%), and SVE Triplets were rare (<1.0%). Isolated VEs were rare (<1.0%, 874), VE Couplets were rare (<1.0%, 7), and VE Triplets were rare (<1.0%, 1). Ventricular Trigeminy was present.    COVID end of May 2022, worsening symptoms into June Went to the ER August 12, 2020 EKG showed normal sinus rhythm  Echocardiogram August 2019 NORMAL LEFT VENTRICULAR SYSTOLIC FUNCTION   WITH MILD LVH  NORMAL RIGHT VENTRICULAR SYSTOLIC FUNCTION  MILD VALVULAR REGURGITATION (See above)  NO VALVULAR STENOSIS  TRIVIAL MR  MILD TR  EF 50%   Outside stress test August 2019, treadmill only  PMH:   has a past medical history of Abnormal mammogram of right breast (01/18/2016), Afib ([facility]), Dysrhythmia, and Hypertension.  PSH:    Past Surgical History:  Procedure Laterality Date   CHOLECYSTECTOMY     COLONOSCOPY WITH PROPOFOL N/A 09/29/2021   Procedure: COLONOSCOPY WITH PROPOFOL;  Surgeon: Wyline Mood, MD;  [facility] ENDOSCOPY;  Service: Gastroenterology;  Laterality: N/A;   ROUX-EN-Y GASTRIC BYPASS  2009   TOTAL ABDOMINAL HYSTERECTOMY  2000   WRIST SURGERY      Current Outpatient Medications  Medication Sig Dispense Refill   apixaban (ELIQUIS) 5 MG TABS tablet Take 1 tablet by mouth twice daily 180 tablet 1   b complex vitamins tablet Take 1 tablet by mouth daily.     Calcium Citrate-Vitamin D (CALCIUM CITRATE + D PO) Take 2 tablets by mouth daily.     CHOLECALCIFEROL  PO Take 5,000 Units by mouth daily.     D-MANNOSE PO Take 1 capsule by mouth daily.     diltiazem (DILT-XR) 180 MG 24 hr capsule Take 1 capsule (180 mg total) by mouth daily. 90 capsule 1   Fe Bisgly-Vit C-Vit B12-FA (GENTLE IRON PO) Take 25 mg by mouth in the morning and at bedtime.     flecainide (TAMBOCOR) 100 MG tablet Take 1 tablet (100 mg total) by mouth 2 (two) times daily. 180 tablet 3   glucosamine-chondroitin 500-400 MG tablet Take 1 tablet by mouth 3 (three) times  daily.     magnesium 30 MG tablet Take 30 mg by mouth 2 (two) times daily.     metoprolol succinate (TOPROL-XL) 50 MG 24 hr tablet Take 1 tablet (50 mg total) by mouth daily. Take with or immediately following a meal. 90 tablet 3   No current facility-administered medications for this visit.    Allergies:   Norco [hydrocodone-acetaminophen] and Tetracyclines & related   Social History:  The patient  reports that she has never smoked. She has never used smokeless tobacco. She reports that she does not drink alcohol and does not use drugs.   Family History:   family history includes Arrhythmia in her brother, brother, mother, and sister; Heart attack (age of onset: 70) in her father; Hypertension in her mother.   Review of Systems: Review of Systems  Constitutional: Negative.   HENT: Negative.    Respiratory: Negative.    Cardiovascular:  Positive for palpitations.  Gastrointestinal: Negative.   Musculoskeletal: Negative.   Neurological: Negative.   Psychiatric/Behavioral: Negative.    All other systems reviewed and are negative.  PHYSICAL EXAM: VS:  BP 130/80 (BP Location: Left Arm, Patient Position: Sitting, Cuff Size: Large)   Pulse (!) 56   Ht 5\' 5"  (1.651 m)   Wt 251 lb (113.9 kg)   SpO2 98%   BMI 41.77 kg/m  , BMI Body mass index is 41.77 kg/m. Constitutional:  oriented to person, place, and time. No distress.  HENT:  Head: Grossly normal Eyes:  no discharge. No scleral icterus.  Neck: No JVD, no carotid bruits  Cardiovascular: Regular rate and rhythm, no murmurs appreciated Pulmonary/Chest: Clear to auscultation bilaterally, no wheezes or rails Abdominal: Soft.  no distension.  no tenderness.  Musculoskeletal: Normal range of motion Neurological:  normal muscle tone. Coordination normal. No atrophy Skin: Skin warm and dry Psychiatric: normal affect, pleasant  Recent Labs: 09/12/2021: ALT 35; BUN 18; Creatinine, Ser 0.76; Hemoglobin 12.2; Platelets 344; Potassium  4.9; Sodium 135; TSH 2.730    Lipid Panel Lab Results  Component Value Date   CHOL 145 09/12/2021   HDL 43 09/12/2021   LDLCALC 78 09/12/2021   TRIG 137 09/12/2021      Wt Readings from Last 3 Encounters:  08/22/22 251 lb (113.9 kg)  05/15/22 252 lb 8 oz (114.5 kg)  04/11/22 250 lb (113.4 kg)     ASSESSMENT AND PLAN:  Atrial fibrillation, paroxysmal Rhythm well-controlled on flecainide 100 twice daily diltiazem ER 180 daily metoprolol succinate 50 daily Recommend she could try metoprolol succinate 25 daily for bradycardia Continue on anticoagulation Eliquis 5 twice daily  HTN: Blood pressure is well controlled on today's visit. No changes made to the medications.  Obesity We have encouraged continued exercise, careful diet management in an effort to lose weight.  She is working closely with weight watchers   Total encounter time more than 30 minutes  Greater than  50% was spent in counseling and coordination of care with the patient    Signed, Esmond Plants, M.D., Ph.D. [place], [place]

## 2022-08-22 ENCOUNTER — Ambulatory Visit: Payer: Medicare Other | Attending: Cardiovascular Disease | Admitting: Cardiovascular Disease

## 2022-08-22 ENCOUNTER — Encounter: Payer: Self-pay | Admitting: Cardiovascular Disease

## 2022-08-22 VITALS — BP 130/80 | HR 56 | Ht 65.0 in | Wt 251.0 lb

## 2022-08-22 DIAGNOSIS — E782 Mixed hyperlipidemia: Secondary | ICD-10-CM | POA: Insufficient documentation

## 2022-08-22 DIAGNOSIS — R7303 Prediabetes: Secondary | ICD-10-CM | POA: Diagnosis present

## 2022-08-22 DIAGNOSIS — I480 Paroxysmal atrial fibrillation: Secondary | ICD-10-CM | POA: Insufficient documentation

## 2022-08-22 DIAGNOSIS — Z9884 Bariatric surgery status: Secondary | ICD-10-CM | POA: Diagnosis not present

## 2022-08-22 DIAGNOSIS — I10 Essential (primary) hypertension: Secondary | ICD-10-CM | POA: Diagnosis not present

## 2022-08-22 DIAGNOSIS — Z6841 Body Mass Index (BMI) 40.0 and over, adult: Secondary | ICD-10-CM | POA: Insufficient documentation

## 2022-08-22 NOTE — Patient Instructions (Addendum)
Medication Instructions:  No changes  If you need a refill on your cardiac medications before your next appointment, please call your pharmacy.    Lab work: No new labs needed   Testing/Procedures: No new testing needed   Follow-Up: At [facility], you and your health needs are our priority.  As part of our continuing mission to provide you with exceptional heart care, we have created designated Provider Care Teams.  These Care Teams include your primary Cardiologist (physician) and Advanced Practice Providers (APPs -  Physician Assistants and Nurse Practitioners) who all work together to provide you with the care you need, when you need it.  You will need a follow up appointment in 6 months  Providers on your designated Care Team:   Christopher Berge, NP Ryan Dunn, PA-C Cadence Furth, PA-C  COVID-19 Vaccine Information can be found at: [email or web address] For questions related to vaccine distribution or appointments, please email [email or web address] or call [phone].   

## 2022-08-29 ENCOUNTER — Encounter: Payer: Self-pay | Admitting: Cardiovascular Disease

## 2022-08-30 ENCOUNTER — Other Ambulatory Visit: Payer: Self-pay | Admitting: Internal Medicine

## 2022-08-30 ENCOUNTER — Encounter: Payer: Self-pay | Admitting: Internal Medicine

## 2022-08-30 DIAGNOSIS — Z1231 Encounter for screening mammogram for malignant neoplasm of breast: Secondary | ICD-10-CM

## 2022-09-15 ENCOUNTER — Ambulatory Visit (INDEPENDENT_AMBULATORY_CARE_PROVIDER_SITE_OTHER): Payer: Medicare Other | Admitting: Internal Medicine

## 2022-09-15 ENCOUNTER — Encounter: Payer: Self-pay | Admitting: Internal Medicine

## 2022-09-15 VITALS — BP 134/78 | HR 56 | Ht 65.0 in | Wt 247.0 lb

## 2022-09-15 DIAGNOSIS — R7303 Prediabetes: Secondary | ICD-10-CM | POA: Diagnosis not present

## 2022-09-15 DIAGNOSIS — Z1159 Encounter for screening for other viral diseases: Secondary | ICD-10-CM

## 2022-09-15 DIAGNOSIS — E782 Mixed hyperlipidemia: Secondary | ICD-10-CM | POA: Diagnosis not present

## 2022-09-15 DIAGNOSIS — I10 Essential (primary) hypertension: Secondary | ICD-10-CM | POA: Diagnosis not present

## 2022-09-15 DIAGNOSIS — I480 Paroxysmal atrial fibrillation: Secondary | ICD-10-CM | POA: Diagnosis not present

## 2022-09-15 DIAGNOSIS — D6869 Other thrombophilia: Secondary | ICD-10-CM | POA: Insufficient documentation

## 2022-09-15 MED ORDER — DILTIAZEM HCL ER 180 MG PO CP24: 180.0000 mg | ORAL_CAPSULE | Freq: Every day | ORAL | 1 refills | Status: DC

## 2022-09-15 NOTE — Assessment & Plan Note (Addendum)
Rate controlled on Flecainide and metoprolol. Anticoagulated in Eliquis.

## 2022-09-15 NOTE — Assessment & Plan Note (Signed)
LDL is  Lab Results  Component Value Date   LDLCALC 78 09/12/2021

## 2022-09-15 NOTE — Assessment & Plan Note (Addendum)
Glucoses controlled with diet changes and [employer]. She has noticed a few low blood sugars after eating concentrated sweets for her birthday. Lab Results  Component Value Date   HGBA1C 5.7 (A) 03/10/2022

## 2022-09-15 NOTE — Progress Notes (Signed)
Date:  09/15/2022   Name:  Judith Wood   DOB:  1955-08-18   MRN:  [id number]   Chief Complaint: Annual Exam Judith Wood is a 67 y.o. female who presents today for her Complete Annual Exam. She feels well. She reports exercising - none. She reports she is sleeping well. Breast complaints - none.  She is on [employer] and has lost about 10 lbs.  She has no new complaints today.  Mammogram: scheduled DEXA: none Colonoscopy: 09/2021 repeat 10 yrs  Health Maintenance Due  Topic Date Due   Medicare Annual Wellness (AWV)  Never done   Hepatitis C Screening  Never done   COVID-19 Vaccine (3 - Pfizer risk series) 08/14/2019    Immunization History  Administered Date(s) Administered   PFIZER Comirnaty(Gray Top)Covid-19 Tri-Sucrose Vaccine 06/25/2019, 07/17/2019   PNEUMOCOCCAL CONJUGATE-20 09/09/2021   Tdap 09/09/2020   Zoster Recombinant(Shingrix) 10/07/2021, 12/27/2021    Hypertension This is a chronic problem. The problem is controlled. Pertinent negatives include no chest pain, headaches, palpitations or shortness of breath. Past treatments include calcium channel blockers and beta blockers. Hypertensive end-organ damage includes CAD/MI. There is no history of kidney disease or CVA.  Afib - no afib on flecainide. Anticoagulated on Eliquis.  Lab Results  Component Value Date   NA 135 09/12/2021   K 4.9 09/12/2021   CO2 24 09/12/2021   GLUCOSE 98 09/12/2021   BUN 18 09/12/2021   CREATININE 0.76 09/12/2021   CALCIUM 9.2 09/12/2021   EGFR 87 09/12/2021   GFRNONAA 78 03/12/2021   Lab Results  Component Value Date   CHOL 145 09/12/2021   HDL 43 09/12/2021   LDLCALC 78 09/12/2021   TRIG 137 09/12/2021   CHOLHDL 3.4 09/12/2021   Lab Results  Component Value Date   TSH 2.730 09/12/2021   Lab Results  Component Value Date   HGBA1C 5.7 (A) 03/10/2022   Lab Results  Component Value Date   WBC 5.0 09/12/2021   HGB 12.2 09/12/2021   HCT 38.5 09/12/2021   MCV 83 09/12/2021    PLT 344 09/12/2021   Lab Results  Component Value Date   ALT 35 (H) 09/12/2021   AST 36 09/12/2021   ALKPHOS 87 09/12/2021   BILITOT 0.4 09/12/2021   Lab Results  Component Value Date   VD25OH 67 03/12/2021     Review of Systems  Constitutional:  Negative for chills, fatigue and fever.  HENT:  Negative for congestion, hearing loss, tinnitus, trouble swallowing and voice change.   Eyes:  Negative for visual disturbance.  Respiratory:  Negative for cough, chest tightness, shortness of breath and wheezing.   Cardiovascular:  Negative for chest pain, palpitations and leg swelling.  Gastrointestinal:  Negative for abdominal pain, constipation, diarrhea and vomiting.  Endocrine: Negative for polydipsia and polyuria.  Genitourinary:  Negative for dysuria, frequency, genital sores, vaginal bleeding and vaginal discharge.  Musculoskeletal:  Negative for arthralgias, gait problem and joint swelling.  Skin:  Negative for color change and rash.  Neurological:  Negative for dizziness, tremors, light-headedness and headaches.  Hematological:  Negative for adenopathy. Does not bruise/bleed easily.  Psychiatric/Behavioral:  Negative for dysphoric mood and sleep disturbance. The patient is not nervous/anxious.     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   Vitamin D deficiency 01/18/2016    Allergies  Allergen Reactions   Norco [Hydrocodone-Acetaminophen] Itching   Tetracyclines & Related Itching and Rash    Past  Surgical History:  Procedure Laterality Date   CHOLECYSTECTOMY     COLONOSCOPY WITH PROPOFOL N/A 09/29/2021   Procedure: COLONOSCOPY WITH PROPOFOL;  Surgeon: Wyline Mood, MD;  Location: [facility] ENDOSCOPY;  Service: Gastroenterology;  Laterality: N/A;   ROUX-EN-Y GASTRIC BYPASS  2009   TOTAL ABDOMINAL HYSTERECTOMY  2000   WRIST SURGERY      Social History   Tobacco Use   Smoking status: Never   Smokeless tobacco: Never  Vaping Use   Vaping status: Never Used  Substance Use Topics   Alcohol use: No   Drug use: No     Medication list has been reviewed and updated.  Current Meds  Medication Sig   apixaban (ELIQUIS) 5 MG TABS tablet Take 1 tablet by mouth twice daily   b complex vitamins tablet Take 1 tablet by mouth daily.   Calcium Citrate-Vitamin D (CALCIUM CITRATE + D PO) Take 2 tablets by mouth daily.   CHOLECALCIFEROL PO Take 5,000 Units by mouth daily.   D-MANNOSE PO Take 1 capsule by mouth daily.   Fe Bisgly-Vit C-Vit B12-FA (GENTLE IRON PO) Take 25 mg by mouth in the morning and at bedtime.   flecainide (TAMBOCOR) 100 MG tablet Take 1 tablet (100 mg total) by mouth 2 (two) times daily.   glucosamine-chondroitin 500-400 MG tablet Take 1 tablet by mouth 3 (three) times daily.   magnesium 30 MG tablet Take 30 mg by mouth 2 (two) times daily.   metoprolol succinate (TOPROL-XL) 50 MG 24 hr tablet Take 1 tablet (50 mg total) by mouth daily. Take with or immediately following a meal.   [DISCONTINUED] diltiazem (DILT-XR) 180 MG 24 hr capsule Take 1 capsule (180 mg total) by mouth daily.       09/07/2021   10:51 AM 12/29/2020    1:52 PM 04/05/2020    8:43 AM  GAD 7 : Generalized Anxiety Score  Nervous, Anxious, on Edge 0 0 0  Control/stop worrying 0 0 0  Worry too much - different things 0 0 0  Trouble relaxing 0 0 0  Restless 0 0 0  Easily annoyed or irritable 0 0 0  Afraid - awful might happen 0 0 0  Total GAD 7 Score 0 0 0  Anxiety Difficulty Not difficult at all Not  difficult at all        09/07/2021   10:51 AM 12/29/2020    1:52 PM 04/05/2020    8:43 AM  Depression screen PHQ 2/9  Decreased Interest 1 1 0  Down, Depressed, Hopeless 1 0 0  PHQ - 2 Score 2 1 0  Altered sleeping 1 0 0  Tired, decreased energy 1 1 0  Change in appetite 0 1 0  Feeling bad or failure about yourself  0 0 0  Trouble concentrating 0 0 0  Moving slowly or fidgety/restless 0 0 0  Suicidal thoughts 0 0 0  PHQ-9 Score 4 3 0  Difficult doing work/chores Not difficult at all Not difficult at all     BP Readings from Last 3 Encounters:  09/15/22 134/78  08/22/22 130/80  05/15/22 120/80    Physical Exam Vitals and nursing note reviewed.  Constitutional:      General: She is not in acute distress.    Appearance: She is well-developed.  HENT:     Head: Normocephalic and atraumatic.     Right Ear: Tympanic membrane and ear canal normal.     Left Ear: Tympanic membrane and ear canal normal.     Nose:     Right Sinus: No maxillary sinus tenderness.     Left Sinus: No maxillary sinus tenderness.  Eyes:     General: No scleral icterus.       Right eye: No discharge.        Left eye: No discharge.     Conjunctiva/sclera: Conjunctivae normal.  Neck:     Thyroid: No thyromegaly.     Vascular: No carotid bruit.  Cardiovascular:     Rate and Rhythm: Normal rate and regular rhythm.     Pulses: Normal pulses.     Heart sounds: Normal heart sounds.  Pulmonary:     Effort: Pulmonary effort is normal. No respiratory distress.     Breath sounds: No wheezing.  Chest:     Comments: Breast exam declined - mammo scheduled Abdominal:     General: Bowel sounds are normal.     Palpations: Abdomen is soft.     Tenderness: There is no abdominal tenderness.  Musculoskeletal:     Cervical back: Normal range of motion. No erythema.     Right lower leg: No edema.     Left lower leg: No edema.  Lymphadenopathy:     Cervical: No cervical adenopathy.  Skin:    General: Skin is  warm and dry.     Capillary Refill: Capillary refill takes less than 2 seconds.     Findings: No rash.  Neurological:     General: No focal deficit present.     Mental Status: She is alert and oriented to person, place, and time.     Cranial Nerves: No cranial nerve deficit.     Sensory: No sensory deficit.     Deep Tendon Reflexes: Reflexes are normal and symmetric.  Psychiatric:        Attention and Perception: Attention normal.        Mood and Affect: Mood normal.     Wt Readings from Last 3 Encounters:  09/15/22 247 lb (112 kg)  08/22/22 251 lb (113.9 kg)  05/15/22 252 lb 8 oz (114.5 kg)    BP 134/78   Pulse (!) 56   Ht 5\' 5"  (1.651 m)   Wt 247 lb (112 kg)   SpO2 98%   BMI 41.10 kg/m   Assessment and Plan:  Problem List Items Addressed This Visit     Prediabetes    Glucoses controlled with diet changes and [employer]. She has noticed a few low blood sugars after eating concentrated sweets for her birthday. Lab Results  Component Value Date   HGBA1C 5.7 (A) 03/10/2022         Relevant Orders   Comprehensive metabolic panel   Hemoglobin A1c   Paroxysmal atrial fibrillation (HCC) (Chronic)    Rate controlled on Flecainide and metoprolol. Anticoagulated in Eliquis.       Relevant Medications   diltiazem (DILT-XR) 180 MG 24 hr capsule   Hyperlipidemia (Chronic)    LDL is  Lab Results  Component Value Date   LDLCALC 78 09/12/2021         Relevant Medications  diltiazem (DILT-XR) 180 MG 24 hr capsule   Other Relevant Orders   Lipid panel   Benign essential HTN - Primary (Chronic)    Stable exam with well controlled BP.  Currently taking metoprolol and diltiazem. Tolerating medications without concerns or side effects. Will continue to recommend low sodium diet and current regimen.       Relevant Medications   diltiazem (DILT-XR) 180 MG 24 hr capsule   Other Relevant Orders   CBC with Differential/Platelet   Comprehensive metabolic panel   TSH    Acquired thrombophilia (HCC)   Other Visit Diagnoses     Need for hepatitis C screening test       Relevant Orders   Hepatitis C antibody      She declines DEXA and depression screenings.  Return in about 6 months (around 03/18/2023) for HTN.   Partially dictated using Dragon software, any errors are not intentional.  Reubin Milan, MD [facility] [place], [place]

## 2022-09-15 NOTE — Assessment & Plan Note (Signed)
Stable exam with well controlled BP.  Currently taking metoprolol and diltiazem. Tolerating medications without concerns or side effects. Will continue to recommend low sodium diet and current regimen.

## 2022-09-20 ENCOUNTER — Ambulatory Visit: Payer: Medicare Other

## 2022-09-20 VITALS — Ht 65.0 in | Wt 247.0 lb

## 2022-09-20 DIAGNOSIS — Z0000 Encounter for general adult medical examination without abnormal findings: Secondary | ICD-10-CM | POA: Diagnosis not present

## 2022-09-20 NOTE — Patient Instructions (Signed)
Judith Wood , Thank you for taking time to come for your Medicare Wellness Visit. I appreciate your ongoing commitment to your health goals. Please review the following plan we discussed and let me know if I can assist you in the future.   These are the goals we discussed:  Goals      DIET - EAT MORE FRUITS AND VEGETABLES        This is a list of the screening recommended for you and due dates:  Health Maintenance  Topic Date Due   Hepatitis C Screening  Never done   COVID-19 Vaccine (3 - Pfizer risk series) 08/14/2019   DEXA scan (bone density measurement)  09/15/2023*   Mammogram  10/01/2022   Flu Shot  10/05/2022   Medicare Annual Wellness Visit  09/20/2023   DTaP/Tdap/Td vaccine (2 - Td or Tdap) 09/10/2030   Colon Cancer Screening  09/30/2031   Pneumonia Vaccine  Completed   Zoster (Shingles) Vaccine  Completed   HPV Vaccine  Aged Out  *Topic was postponed. The date shown is not the original due date.    Advanced directives: no  Conditions/risks identified: none  Next appointment: Follow up in one year for your annual wellness visit 09/26/23 @ 9:15 am by phone   Preventive Care 65 Years and Older, Female Preventive care refers to lifestyle choices and visits with your health care provider that can promote health and wellness. What does preventive care include? A yearly physical exam. This is also called an annual well check. Dental exams once or twice a year. Routine eye exams. Ask your health care provider how often you should have your eyes checked. Personal lifestyle choices, including: Daily care of your teeth and gums. Regular physical activity. Eating a healthy diet. Avoiding tobacco and drug use. Limiting alcohol use. Practicing safe sex. Taking low-dose aspirin every day. Taking vitamin and mineral supplements as recommended by your health care provider. What happens during an annual well check? The services and screenings done by your health care  provider during your annual well check will depend on your age, overall health, lifestyle risk factors, and family history of disease. Counseling  Your health care provider may ask you questions about your: Alcohol use. Tobacco use. Drug use. Emotional well-being. Home and relationship well-being. Sexual activity. Eating habits. History of falls. Memory and ability to understand (cognition). Work and work Astronomer. Reproductive health. Screening  You may have the following tests or measurements: Height, weight, and BMI. Blood pressure. Lipid and cholesterol levels. These may be checked every 5 years, or more frequently if you are over 51 years old. Skin check. Lung cancer screening. You may have this screening every year starting at age 38 if you have a 30-pack-year history of smoking and currently smoke or have quit within the past 15 years. Fecal occult blood test (FOBT) of the stool. You may have this test every year starting at age 30. Flexible sigmoidoscopy or colonoscopy. You may have a sigmoidoscopy every 5 years or a colonoscopy every 10 years starting at age 5. Hepatitis C blood test. Hepatitis B blood test. Sexually transmitted disease (STD) testing. Diabetes screening. This is done by checking your blood sugar (glucose) after you have not eaten for a while (fasting). You may have this done every 1-3 years. Bone density scan. This is done to screen for osteoporosis. You may have this done starting at age 63. Mammogram. This may be done every 1-2 years. Talk to your health care provider about  how often you should have regular mammograms. Talk with your health care provider about your test results, treatment options, and if necessary, the need for more tests. Vaccines  Your health care provider may recommend certain vaccines, such as: Influenza vaccine. This is recommended every year. Tetanus, diphtheria, and acellular pertussis (Tdap, Td) vaccine. You may need a Td  booster every 10 years. Zoster vaccine. You may need this after age 79. Pneumococcal 13-valent conjugate (PCV13) vaccine. One dose is recommended after age 32. Pneumococcal polysaccharide (PPSV23) vaccine. One dose is recommended after age 29. Talk to your health care provider about which screenings and vaccines you need and how often you need them. This information is not intended to replace advice given to you by your health care provider. Make sure you discuss any questions you have with your health care provider. Document Released: 03/19/2015 Document Revised: 11/10/2015 Document Reviewed: 12/22/2014 Elsevier Interactive Patient Education  2017 [employer].  Fall Prevention in the Home Falls can cause injuries. They can happen to people of all ages. There are many things you can do to make your home safe and to help prevent falls. What can I do on the outside of my home? Regularly fix the edges of walkways and driveways and fix any cracks. Remove anything that might make you trip as you walk through a door, such as a raised step or threshold. Trim any bushes or trees on the path to your home. Use bright outdoor lighting. Clear any walking paths of anything that might make someone trip, such as rocks or tools. Regularly check to see if handrails are loose or broken. Make sure that both sides of any steps have handrails. Any raised decks and porches should have guardrails on the edges. Have any leaves, snow, or ice cleared regularly. Use sand or salt on walking paths during winter. Clean up any spills in your garage right away. This includes oil or grease spills. What can I do in the bathroom? Use night lights. Install grab bars by the toilet and in the tub and shower. Do not use towel bars as grab bars. Use non-skid mats or decals in the tub or shower. If you need to sit down in the shower, use a plastic, non-slip stool. Keep the floor dry. Clean up any water that spills on the floor  as soon as it happens. Remove soap buildup in the tub or shower regularly. Attach bath mats securely with double-sided non-slip rug tape. Do not have throw rugs and other things on the floor that can make you trip. What can I do in the bedroom? Use night lights. Make sure that you have a light by your bed that is easy to reach. Do not use any sheets or blankets that are too big for your bed. They should not hang down onto the floor. Have a firm chair that has side arms. You can use this for support while you get dressed. Do not have throw rugs and other things on the floor that can make you trip. What can I do in the kitchen? Clean up any spills right away. Avoid walking on wet floors. Keep items that you use a lot in easy-to-reach places. If you need to reach something above you, use a strong step stool that has a grab bar. Keep electrical cords out of the way. Do not use floor polish or wax that makes floors slippery. If you must use wax, use non-skid floor wax. Do not have throw rugs and other  things on the floor that can make you trip. What can I do with my stairs? Do not leave any items on the stairs. Make sure that there are handrails on both sides of the stairs and use them. Fix handrails that are broken or loose. Make sure that handrails are as long as the stairways. Check any carpeting to make sure that it is firmly attached to the stairs. Fix any carpet that is loose or worn. Avoid having throw rugs at the top or bottom of the stairs. If you do have throw rugs, attach them to the floor with carpet tape. Make sure that you have a light switch at the top of the stairs and the bottom of the stairs. If you do not have them, ask someone to add them for you. What else can I do to help prevent falls? Wear shoes that: Do not have high heels. Have rubber bottoms. Are comfortable and fit you well. Are closed at the toe. Do not wear sandals. If you use a stepladder: Make sure that it is  fully opened. Do not climb a closed stepladder. Make sure that both sides of the stepladder are locked into place. Ask someone to hold it for you, if possible. Clearly mark and make sure that you can see: Any grab bars or handrails. First and last steps. Where the edge of each step is. Use tools that help you move around (mobility aids) if they are needed. These include: Canes. Walkers. Scooters. Crutches. Turn on the lights when you go into a dark area. Replace any light bulbs as soon as they burn out. Set up your furniture so you have a clear path. Avoid moving your furniture around. If any of your floors are uneven, fix them. If there are any pets around you, be aware of where they are. Review your medicines with your doctor. Some medicines can make you feel dizzy. This can increase your chance of falling. Ask your doctor what other things that you can do to help prevent falls. This information is not intended to replace advice given to you by your health care provider. Make sure you discuss any questions you have with your health care provider. Document Released: 12/17/2008 Document Revised: 07/29/2015 Document Reviewed: 03/27/2014 Elsevier Interactive Patient Education  2017 [employer].

## 2022-09-20 NOTE — Progress Notes (Signed)
Subjective:   Judith Wood is a 67 y.o. female who presents for an Initial Medicare Annual Wellness Visit.  Per patient no change in vitals since last visit, unable to obtain new vitals due to telehealth visit   Visit Complete: Virtual  I connected with  [name] on 09/20/22 by a audio enabled telemedicine application and verified that I am speaking with the correct person using two identifiers.  Patient Location: Home  Provider Location: Office/Clinic  I discussed the limitations of evaluation and management by telemedicine. The patient expressed understanding and agreed to proceed.  Review of Systems     Cardiac Risk Factors include: advanced age (>53men, >45 women);dyslipidemia;hypertension;sedentary lifestyle     Objective:    Today's Vitals   09/20/22 1005 09/20/22 1016  Weight:  247 lb (112 kg)  Height:  5\' 5"  (1.651 m)  PainSc: 0-No pain    Body mass index is 41.1 kg/m.     09/20/2022   10:09 AM 09/29/2021   10:29 AM 09/10/2021    9:13 AM 08/12/2020    9:34 AM 03/09/2017    7:37 PM 01/18/2016    2:17 PM 10/14/2015    5:38 PM  Advanced Directives  Does Patient Have a Medical Advance Directive? No Yes Yes Yes No Yes Yes  Type of Special educational needs teacher of [place];Living will Healthcare Power of [place];Living will Healthcare Power of [place];Living will  Living will Out of facility DNR (pink MOST or yellow form)  Would patient like information on creating a medical advance directive? No - Patient declined          Current Medications (verified) Outpatient Encounter Medications as of 09/20/2022  Medication Sig   apixaban (ELIQUIS) 5 MG TABS tablet Take 1 tablet by mouth twice daily   b complex vitamins tablet Take 1 tablet by mouth daily.   Calcium Citrate-Vitamin D (CALCIUM CITRATE + D PO) Take 2 tablets by mouth daily.   CHOLECALCIFEROL PO Take 5,000 Units by mouth daily.   D-MANNOSE PO Take 1 capsule by mouth daily.   diltiazem (DILT-XR)  180 MG 24 hr capsule Take 1 capsule (180 mg total) by mouth daily.   Fe Bisgly-Vit C-Vit B12-FA (GENTLE IRON PO) Take 25 mg by mouth in the morning and at bedtime.   flecainide (TAMBOCOR) 100 MG tablet Take 1 tablet (100 mg total) by mouth 2 (two) times daily.   glucosamine-chondroitin 500-400 MG tablet Take 1 tablet by mouth 3 (three) times daily.   magnesium 30 MG tablet Take 30 mg by mouth 2 (two) times daily.   metoprolol succinate (TOPROL-XL) 50 MG 24 hr tablet Take 1 tablet (50 mg total) by mouth daily. Take with or immediately following a meal.   No facility-administered encounter medications on file as of 09/20/2022.    Allergies (verified) Norco [hydrocodone-acetaminophen] and Tetracyclines & related   History: Past Medical History:  Diagnosis Date   Abnormal mammogram of right breast 01/18/2016   Afib (HCC)    Dysrhythmia    Hypertension    Past Surgical History:  Procedure Laterality Date   CHOLECYSTECTOMY     COLONOSCOPY WITH PROPOFOL N/A 09/29/2021   Procedure: COLONOSCOPY WITH PROPOFOL;  Surgeon: Wyline Mood, MD;  [facility];  Service: Gastroenterology;  Laterality: N/A;   ROUX-EN-Y GASTRIC BYPASS  2009   TOTAL ABDOMINAL HYSTERECTOMY  2000   WRIST SURGERY     Family History  Problem Relation Age of Onset   Hypertension Mother    Arrhythmia  Mother        Pacemaker implant    Heart attack Father 53   Arrhythmia Sister    Arrhythmia Brother    Arrhythmia Brother    Breast cancer Neg Hx    Social History   Socioeconomic History   Marital status: Married    Spouse name: Not on file   Number of children: 1   Years of education: Not on file   Highest education level: Not on file  Occupational History   Not on file  Tobacco Use   Smoking status: Never   Smokeless tobacco: Never  Vaping Use   Vaping status: Never Used  Substance and Sexual Activity   Alcohol use: No   Drug use: No   Sexual activity: Not on file  Other Topics Concern   Not  on file  Social History Narrative   Not on file   Social Determinants of Health   Financial Resource Strain: Patient Declined (09/20/2022)   Overall Financial Resource Strain (CARDIA)    Difficulty of Paying Living Expenses: Patient declined  Food Insecurity: No Food Insecurity (09/20/2022)   Hunger Vital Sign    Worried About Running Out of Food in the Last Year: Never true    Ran Out of Food in the Last Year: Never true  Transportation Needs: No Transportation Needs (09/20/2022)   PRAPARE - Administrator, Civil Service (Medical): No    Lack of Transportation (Non-Medical): No  Physical Activity: Inactive (09/20/2022)   Exercise Vital Sign    Days of Exercise per Week: 0 days    Minutes of Exercise per Session: 0 min  Stress: No Stress Concern Present (09/20/2022)   [employer] of Occupational Health - Occupational Stress Questionnaire    Feeling of Stress : Not at all  Social Connections: Unknown (09/20/2022)   Social Connection and Isolation Panel [NHANES]    Frequency of Communication with Friends and Family: Patient declined    Frequency of Social Gatherings with Friends and Family: Patient declined    Attends Religious Services: Patient declined    Database administrator or Organizations: Patient declined    Attends Engineer, structural: Patient declined    Marital Status: Married    Tobacco Counseling Counseling given: Not Answered   Clinical Intake:  Pre-visit preparation completed: Yes  Pain : No/denies pain Pain Score: 0-No pain     Nutritional Risks: None Diabetes: No  How often do you need to have someone help you when you read instructions, pamphlets, or other written materials from your doctor or pharmacy?: 1 - Never  Interpreter Needed?: No  Information entered by :: Kennedy Bucker, LPN   Activities of Daily Living    09/20/2022   10:10 AM 09/19/2022    1:27 PM  In your present state of health, do you have any difficulty  performing the following activities:  Hearing? 0 0  Vision? 0 0  Difficulty concentrating or making decisions? 0 0  Walking or climbing stairs? 0 0  Dressing or bathing? 0 0  Doing errands, shopping? 0 0  Preparing Food and eating ? N N  Using the Toilet? N N  In the past six months, have you accidently leaked urine? N Y  Do you have problems with loss of bowel control? N N  Managing your Medications? N N  Managing your Finances? N N  Housekeeping or managing your Housekeeping? N N    Patient Care Team: Reubin Milan, MD  as PCP - General (Internal Medicine) Mariah Milling, Tollie Pizza, MD as PCP - Cardiology (Cardiology) Abbie Sons, MD as Referring Physician (Dermatology)  Indicate any recent Medical Services you may have received from other than [facility] providers in the past year (date may be approximate).     Assessment:   This is a routine wellness examination for Evah.  Hearing/Vision screen Hearing Screening - Comments:: No aids Vision Screening - Comments:: Wears glasses- [facility]  Dietary issues and exercise activities discussed:     Goals Addressed             This Visit's Progress    DIET - EAT MORE FRUITS AND VEGETABLES         Depression Screen    09/20/2022   10:07 AM 09/15/2022    9:56 AM 03/10/2022    1:35 PM 10/25/2021    2:23 PM 09/07/2021   10:51 AM 12/29/2020    1:52 PM 04/05/2020    8:43 AM  PHQ 2/9 Scores  PHQ - 2 Score 0    2 1 0  PHQ- 9 Score 0    4 3 0  Exception Documentation  Patient refusal Patient refusal Patient refusal       Fall Risk    09/20/2022   10:09 AM 09/19/2022    1:27 PM 09/15/2022    9:55 AM 03/10/2022    1:34 PM 10/25/2021    2:23 PM  Fall Risk   Falls in the past year? 0 0 0 0 0  Number falls in past yr: 0  0 0 0  Injury with Fall? 0  0 0 0  Risk for fall due to : No Fall Risks  No Fall Risks No Fall Risks No Fall Risks  Follow up Falls prevention discussed;Falls evaluation completed  Falls evaluation completed  Falls evaluation completed Falls evaluation completed    MEDICARE RISK AT HOME:  Medicare Risk at Home - 09/20/22 1009     Any stairs in or around the home? Yes    If so, are there any without handrails? No    Home free of loose throw rugs in walkways, pet beds, electrical cords, etc? No    Adequate lighting in your home to reduce risk of falls? Yes    Life alert? No    Use of a cane, walker or w/c? No    Grab bars in the bathroom? Yes    Shower chair or bench in shower? No    Elevated toilet seat or a handicapped toilet? No             TIMED UP AND GO:  Was the test performed? No    Cognitive Function:refused to participate in testing 2024- pt A & O x 3        Immunizations Immunization History  Administered Date(s) Administered   PFIZER Comirnaty(Gray Top)Covid-19 Tri-Sucrose Vaccine 06/25/2019, 07/17/2019   PNEUMOCOCCAL CONJUGATE-20 09/09/2021   Tdap 09/09/2020   Zoster Recombinant(Shingrix) 10/07/2021, 12/27/2021    TDAP status: Up to date  Flu Vaccine status: Declined, Education has been provided regarding the importance of this vaccine but patient still declined. Advised may receive this vaccine at local pharmacy or Health Dept. Aware to provide a copy of the vaccination record if obtained from local pharmacy or Health Dept. Verbalized acceptance and understanding.  Pneumococcal vaccine status: Up to date  Covid-19 vaccine status: Completed vaccines  Qualifies for Shingles Vaccine? Yes   Zostavax completed No   Shingrix Completed?: Yes  Screening Tests Health Maintenance  Topic Date Due   Hepatitis C Screening  Never done   COVID-19 Vaccine (3 - Pfizer risk series) 08/14/2019   DEXA SCAN  09/15/2023 (Originally 09/12/2021)   MAMMOGRAM  10/01/2022   INFLUENZA VACCINE  10/05/2022   Medicare Annual Wellness (AWV)  09/20/2023   DTaP/Tdap/Td (2 - Td or Tdap) 09/10/2030   Colonoscopy  09/30/2031   Pneumonia Vaccine 80+ Years old  Completed   Zoster  Vaccines- Shingrix  Completed   HPV VACCINES  Aged Out    Health Maintenance  Health Maintenance Due  Topic Date Due   Hepatitis C Screening  Never done   COVID-19 Vaccine (3 - Pfizer risk series) 08/14/2019    Colorectal cancer screening: Type of screening: Colonoscopy. Completed 09/29/21. Repeat every 10 years  Mammogram status: Completed SCHEDULED 10/03/22. Repeat every year  POSTPONED UNTIL 09/15/23  Lung Cancer Screening: (Low Dose CT Chest recommended if Age 38-80 years, 20 pack-year currently smoking OR have quit w/in 15years.) does not qualify.    Additional Screening:  Hepatitis C Screening: does qualify; Completed NO  Vision Screening: Recommended annual ophthalmology exams for early detection of glaucoma and other disorders of the eye. Is the patient up to date with their annual eye exam?  Yes  Who is the provider or what is the name of the office in which the patient attends annual eye exams? [facility] If pt is not established with a provider, would they like to be referred to a provider to establish care? No .   Dental Screening: Recommended annual dental exams for proper oral hygiene   Community Resource Referral / Chronic Care Management: CRR required this visit?  No   CCM required this visit?  No     Plan:     I have personally reviewed and noted the following in the patient's chart:   Medical and social history Use of alcohol, tobacco or illicit drugs  Current medications and supplements including opioid prescriptions. Patient is not currently taking opioid prescriptions. Functional ability and status Nutritional status Physical activity Advanced directives List of other physicians Hospitalizations, surgeries, and ER visits in previous 12 months Vitals Screenings to include cognitive, depression, and falls Referrals and appointments  In addition, I have reviewed and discussed with patient certain preventive protocols, quality metrics, and  best practice recommendations. A written personalized care plan for preventive services as well as general preventive health recommendations were provided to patient.     Hal Hope, LPN   0/27/2536   After Visit Summary: (MyChart) Due to this being a telephonic visit, the after visit summary with patients personalized plan was offered to patient via MyChart   Nurse Notes: none

## 2022-09-21 ENCOUNTER — Emergency Department: Payer: Medicare Other

## 2022-09-21 ENCOUNTER — Other Ambulatory Visit: Payer: Self-pay

## 2022-09-21 ENCOUNTER — Emergency Department
Admission: EM | Admit: 2022-09-21 | Discharge: 2022-09-22 | Discharge status: Against Medical Advice | Payer: Medicare Other | Attending: Emergency Medicine | Admitting: Emergency Medicine

## 2022-09-21 DIAGNOSIS — Y92194 Driveway of other specified residential institution as the place of occurrence of the external cause: Secondary | ICD-10-CM | POA: Diagnosis not present

## 2022-09-21 DIAGNOSIS — S0532XA Ocular laceration without prolapse or loss of intraocular tissue, left eye, initial encounter: Secondary | ICD-10-CM | POA: Diagnosis not present

## 2022-09-21 DIAGNOSIS — W01198A Fall on same level from slipping, tripping and stumbling with subsequent striking against other object, initial encounter: Secondary | ICD-10-CM | POA: Insufficient documentation

## 2022-09-21 DIAGNOSIS — Y9301 Activity, walking, marching and hiking: Secondary | ICD-10-CM | POA: Diagnosis not present

## 2022-09-21 DIAGNOSIS — S0990XA Unspecified injury of head, initial encounter: Secondary | ICD-10-CM | POA: Diagnosis not present

## 2022-09-21 DIAGNOSIS — M25532 Pain in left wrist: Secondary | ICD-10-CM | POA: Insufficient documentation

## 2022-09-21 NOTE — ED Triage Notes (Addendum)
Pt to ED via POV c/o head injury. Pt reports she was walking on driveway when she tripped over something and fell. Pt hit her head on concrete, no loc and pt takes eliquis. Pt reports some soreness to left wrist. Pt with abrasion above left eye and ~2in laceration to left of left eye. No bleeding at this time.

## 2022-09-22 ENCOUNTER — Ambulatory Visit: Payer: Medicare Other

## 2022-09-25 ENCOUNTER — Telehealth: Payer: Self-pay

## 2022-09-25 NOTE — Transitions of Care (Post Inpatient/ED Visit) (Signed)
09/25/2022  Name: Judith Wood MRN: [id number] DOB: Aug 03, 1955  Today's TOC FU Call Status: Today's TOC FU Call Status:: Successful TOC FU Call Competed TOC FU Call Complete Date: 09/25/22  Transition Care Management Follow-up Telephone Call Date of Discharge: 09/21/22 Discharge Facility[facility]) Type of Discharge: Emergency Department Reason for ED Visit: Other: (Fall) How have you been since you were released from the hospital?: Same Any questions or concerns?: No  Items Reviewed: Did you receive and understand the discharge instructions provided?: No Medications obtained,verified, and reconciled?: Yes (Medications Reviewed) Any new allergies since your discharge?: No Dietary orders reviewed?: No Do you have support at home?: No  Medications Reviewed Today: Medications Reviewed Today     Reviewed by Mariel Sleet, CMA (Certified Medical Assistant) on 09/25/22 at 1132  Med List Status: <None>   Medication Order Taking? Sig Documenting Provider Last Dose Status Informant  apixaban (ELIQUIS) 5 MG TABS tablet [id number] Yes Take 1 tablet by mouth twice daily Antonieta Iba, MD Taking Active   b complex vitamins tablet [id number] Yes Take 1 tablet by mouth daily. [provider] Taking Active   Calcium Citrate-Vitamin D (CALCIUM CITRATE + D PO) [id number] Yes Take 2 tablets by mouth daily. [provider] Taking Active   cephALEXin (KEFLEX) 500 MG capsule [id number] Yes Take 1,000 mg by mouth 2 (two) times daily. [provider] Taking Active   CHOLECALCIFEROL PO [id number] Yes Take 5,000 Units by mouth daily. [provider] Taking Active   D-MANNOSE PO [id number] Yes Take 1 capsule by mouth daily. [provider] Taking Active   diltiazem (DILT-XR) 180 MG 24 hr capsule [id number] Yes Take 1 capsule (180 mg total) by mouth daily. Reubin Milan, MD Taking Active   Fe Bisgly-Vit C-Vit B12-FA (GENTLE  IRON PO) [id number] Yes Take 25 mg by mouth in the morning and at bedtime. [provider] Taking Active   flecainide (TAMBOCOR) 100 MG tablet [id number] Yes Take 1 tablet (100 mg total) by mouth 2 (two) times daily. Antonieta Iba, MD Taking Active   glucosamine-chondroitin 500-400 MG tablet [id number] Yes Take 1 tablet by mouth 3 (three) times daily. [provider] Taking Active   magnesium 30 MG tablet [id number] Yes Take 30 mg by mouth 2 (two) times daily. [provider] Taking Active   metoprolol succinate (TOPROL-XL) 50 MG 24 hr tablet [id number] Yes Take 1 tablet (50 mg total) by mouth daily. Take with or immediately following a meal. Gollan, Tollie Pizza, MD Taking Active             Home Care and Equipment/Supplies: Were Home Health Services Ordered?: No Any new equipment or medical supplies ordered?: No  Functional Questionnaire: Do you need assistance with bathing/showering or dressing?: No Do you need assistance with meal preparation?: No Do you need assistance with eating?: No Do you have difficulty maintaining continence: No Do you need assistance with getting out of bed/getting out of a chair/moving?: No Do you have difficulty managing or taking your medications?: No  Follow up appointments reviewed: PCP Follow-up appointment confirmed?: No MD Provider Line Number:[phone] Given: Yes Specialist Hospital Follow-up appointment confirmed?: No Do you need transportation to your follow-up appointment?: No Do you understand care options if your condition(s) worsen?: Yes-patient verbalized understanding    Joyous Gleghorn [facility]  Primary Care & Sports Medicine at [facility] CMA, AAMA [address]  [place] [address] Office [phone]  Fax: [phone]

## 2022-10-03 ENCOUNTER — Ambulatory Visit
Admission: RE | Admit: 2022-10-03 | Discharge: 2022-10-03 | Disposition: A | Discharge status: Home / Self Care | Payer: Medicare Other | Source: Ambulatory Visit | Attending: Internal Medicine | Admitting: Internal Medicine

## 2022-10-03 DIAGNOSIS — Z1231 Encounter for screening mammogram for malignant neoplasm of breast: Secondary | ICD-10-CM | POA: Diagnosis present

## 2023-02-23 ENCOUNTER — Ambulatory Visit: Payer: Medicare Other | Attending: Cardiovascular Disease | Admitting: Cardiovascular Disease

## 2023-02-23 ENCOUNTER — Encounter: Payer: Self-pay | Admitting: Cardiovascular Disease

## 2023-02-23 VITALS — BP 110/70 | HR 55 | Ht 65.0 in | Wt 251.1 lb

## 2023-02-23 DIAGNOSIS — Z9884 Bariatric surgery status: Secondary | ICD-10-CM | POA: Diagnosis not present

## 2023-02-23 DIAGNOSIS — Z6841 Body Mass Index (BMI) 40.0 and over, adult: Secondary | ICD-10-CM | POA: Diagnosis present

## 2023-02-23 DIAGNOSIS — I480 Paroxysmal atrial fibrillation: Secondary | ICD-10-CM | POA: Insufficient documentation

## 2023-02-23 DIAGNOSIS — E782 Mixed hyperlipidemia: Secondary | ICD-10-CM | POA: Insufficient documentation

## 2023-02-23 DIAGNOSIS — R7303 Prediabetes: Secondary | ICD-10-CM | POA: Diagnosis present

## 2023-02-23 DIAGNOSIS — I10 Essential (primary) hypertension: Secondary | ICD-10-CM | POA: Diagnosis present

## 2023-02-23 NOTE — Progress Notes (Signed)
Cardiology Office Note  Date:  02/23/2023   ID:  Soleil, [place] 1955-03-09, MRN [id number]  PCP:  Reubin Milan, MD   Chief Complaint  Patient presents with   6 month follow up     "Doing well." Medications reviewed by the patient verbally.     HPI:  Ms. Lucette Petruso is a 67 year old woman with past medical history of paroxysmal atrial fibrillation by outside cardiologist kernodle Hyperlipidemia Hypertension History of smoking A. Fib, CHADS VASC 2,  Roux-y-surgery Who presents for follow-up of her paroxysmal atrial fibrillation, COVID infection  Last seen in clinic June 2024 In follow-up she reports that her atrial fibrillation has been well-controlled Denies significant tachycardia or palpitations  Continues on Flecainide 100 twice daily with diltiazem ER 180 daily metoprolol succinate 50 daily  Denies orthostasis symptoms Aggressively trying to lose weight through dietary changes, working with weight watchers No regular exercise program  No significant chest pain, no shortness of breath, no lower extremity swelling  Denies any bleeding on Eliquis 5 twice daily  Lab work reviewed A1C 5.7  EKG personally reviewed by myself on todays visit EKG Interpretation Date/Time:  Friday February 23 2023 10:40:46 EST Ventricular Rate:  55 PR Interval:  202 QRS Duration:  102 QT Interval:  454 QTC Calculation: 434 R Axis:   21  Text Interpretation: Sinus bradycardia When compared with ECG of 09-Mar-2017 19:40, No significant change was found Confirmed by Julien Nordmann [phone]) on 02/23/2023 10:59:43 AM    Other past medical history reviewed ZIO monitor reviewed Patient had a min HR of 46 bpm, max HR of 175 bpm, and avg HR of 65 bpm.  Predominant underlying rhythm was Sinus Rhythm.    1 run of Ventricular Tachycardia occurred lasting 5 beats with a max rate of 135 bpm (avg 127 bpm).    Atrial Fibrillation/Flutter occurred (2% burden), ranging from 50-175 bpm  (avg of 74 bpm), the longest lasting 6 hours 9 mins with an avg rate of 74 bpm.  Atrial Fibrillation/Flutter was detected within +/- 45 seconds of symptomatic patient event(s).    Isolated SVEs were rare (<1.0%),SVE Couplets were rare (<1.0%), and SVE Triplets were rare (<1.0%). Isolated VEs were rare (<1.0%, 874), VE Couplets were rare (<1.0%, 7), and VE Triplets were rare (<1.0%, 1). Ventricular Trigeminy was present.    COVID end of May 2022, worsening symptoms into June Went to the ER August 12, 2020 EKG showed normal sinus rhythm  Echocardiogram August 2019 NORMAL LEFT VENTRICULAR SYSTOLIC FUNCTION   WITH MILD LVH  NORMAL RIGHT VENTRICULAR SYSTOLIC FUNCTION  MILD VALVULAR REGURGITATION (See above)  NO VALVULAR STENOSIS  TRIVIAL MR  MILD TR  EF 50%   Outside stress test August 2019, treadmill only  PMH:   has a past medical history of Abnormal mammogram of right breast (01/18/2016), Afib (HCC), Dysrhythmia, and Hypertension.  PSH:    Past Surgical History:  Procedure Laterality Date   CHOLECYSTECTOMY     COLONOSCOPY WITH PROPOFOL N/A 09/29/2021   Procedure: COLONOSCOPY WITH PROPOFOL;  Surgeon: Wyline Mood, MD;  [facility]: [facility] ENDOSCOPY;  Service: Gastroenterology;  Laterality: N/A;   ROUX-EN-Y GASTRIC BYPASS  2009   TOTAL ABDOMINAL HYSTERECTOMY  2000   WRIST SURGERY      Current Outpatient Medications  Medication Sig Dispense Refill   apixaban (ELIQUIS) 5 MG TABS tablet Take 1 tablet by mouth twice daily 180 tablet 1   b complex vitamins tablet Take 1 tablet by mouth daily.  Calcium Citrate-Vitamin D (CALCIUM CITRATE + D PO) Take 2 tablets by mouth daily.     CHOLECALCIFEROL PO Take 5,000 Units by mouth daily.     D-MANNOSE PO Take 1 capsule by mouth daily.     diltiazem (DILT-XR) 180 MG 24 hr capsule Take 1 capsule (180 mg total) by mouth daily. 90 capsule 1   Fe Bisgly-Vit C-Vit B12-FA (GENTLE IRON PO) Take 25 mg by mouth in the morning and at bedtime.     flecainide  (TAMBOCOR) 100 MG tablet Take 1 tablet (100 mg total) by mouth 2 (two) times daily. 180 tablet 3   glucosamine-chondroitin 500-400 MG tablet Take 1 tablet by mouth 3 (three) times daily.     magnesium 30 MG tablet Take 30 mg by mouth 2 (two) times daily.     metoprolol succinate (TOPROL-XL) 50 MG 24 hr tablet Take 1 tablet (50 mg total) by mouth daily. Take with or immediately following a meal. 90 tablet 3   No current facility-administered medications for this visit.    Allergies:   Norco [hydrocodone-acetaminophen] and Tetracyclines & related   Social History:  The patient  reports that she has never smoked. She has never used smokeless tobacco. She reports that she does not drink alcohol and does not use drugs.   Family History:   family history includes Arrhythmia in her brother, brother, mother, and sister; Heart attack (age of onset: 69) in her father; Hypertension in her mother.   Review of Systems: Review of Systems  Constitutional: Negative.   HENT: Negative.    Respiratory: Negative.    Cardiovascular: Negative.   Gastrointestinal: Negative.   Musculoskeletal: Negative.   Neurological: Negative.   Psychiatric/Behavioral: Negative.    All other systems reviewed and are negative.  PHYSICAL EXAM: VS:  BP 110/70 (BP Location: Left Arm, Patient Position: Sitting, Cuff Size: Large)   Pulse (!) 55   Ht 5\' 5"  (1.651 m)   Wt 251 lb 2 oz (113.9 kg)   SpO2 96%   BMI 41.79 kg/m  , BMI Body mass index is 41.79 kg/m. Constitutional:  oriented to person, place, and time. No distress.  HENT:  Head: Grossly normal Eyes:  no discharge. No scleral icterus.  Neck: No JVD, no carotid bruits  Cardiovascular: Regular rate and rhythm, no murmurs appreciated Pulmonary/Chest: Clear to auscultation bilaterally, no wheezes or rails Abdominal: Soft.  no distension.  no tenderness.  Musculoskeletal: Normal range of motion Neurological:  normal muscle tone. Coordination normal. No  atrophy Skin: Skin warm and dry Psychiatric: normal affect, pleasant  Recent Labs: 10/02/2022: ALT 70; BUN 20; Creatinine, Ser 0.91; Hemoglobin 13.7; Platelets 364; Potassium 5.0; Sodium 129; TSH 2.340    Lipid Panel Lab Results  Component Value Date   CHOL 155 10/02/2022   HDL 39 (L) 10/02/2022   LDLCALC 82 10/02/2022   TRIG 203 (H) 10/02/2022     Wt Readings from Last 3 Encounters:  02/23/23 251 lb 2 oz (113.9 kg)  09/21/22 248 lb (112.5 kg)  09/20/22 247 lb (112 kg)     ASSESSMENT AND PLAN:  Atrial fibrillation, paroxysmal Maintaining normal sinus rhythm, no symptoms for paroxysmal atrial fibrillation Recommend she continue flecainide 100 twice daily diltiazem ER 180 daily metoprolol succinate 50 daily Continue on anticoagulation Eliquis 5 twice daily For orthostatic hypotension symptoms may need to decrease diltiazem ER down to 120 daily  HTN: Blood pressure is well controlled on today's visit. No changes made to the medications. For orthostasis  symptoms may need to decrease diltiazem ER dosing  Obesity We have encouraged continued exercise, careful diet management in an effort to lose weight.    Signed, Dossie Arbour, M.D., Ph.D. [facility] [place], [place] [phone]

## 2023-02-23 NOTE — Patient Instructions (Signed)

## 2023-03-04 ENCOUNTER — Other Ambulatory Visit: Payer: Self-pay | Admitting: Cardiovascular Disease

## 2023-03-05 NOTE — Telephone Encounter (Signed)
Prescription refill request for Eliquis received. Indication:afib Last office visit:12/24 Scr:0.91  7/24 Age: 67 Weight:113.9  kg  Prescription refilled

## 2023-03-20 ENCOUNTER — Ambulatory Visit (INDEPENDENT_AMBULATORY_CARE_PROVIDER_SITE_OTHER): Payer: Medicare Other | Admitting: Internal Medicine

## 2023-03-20 ENCOUNTER — Encounter: Payer: Self-pay | Admitting: Internal Medicine

## 2023-03-20 VITALS — BP 120/76 | HR 56 | Ht 65.0 in | Wt 250.0 lb

## 2023-03-20 DIAGNOSIS — R7303 Prediabetes: Secondary | ICD-10-CM

## 2023-03-20 DIAGNOSIS — Z6841 Body Mass Index (BMI) 40.0 and over, adult: Secondary | ICD-10-CM

## 2023-03-20 DIAGNOSIS — I480 Paroxysmal atrial fibrillation: Secondary | ICD-10-CM

## 2023-03-20 DIAGNOSIS — I10 Essential (primary) hypertension: Secondary | ICD-10-CM

## 2023-03-20 DIAGNOSIS — G8929 Other chronic pain: Secondary | ICD-10-CM

## 2023-03-20 DIAGNOSIS — M25512 Pain in left shoulder: Secondary | ICD-10-CM

## 2023-03-20 MED ORDER — DILTIAZEM HCL ER 180 MG PO CP24: 180.0000 mg | ORAL_CAPSULE | Freq: Every day | ORAL | 1 refills | Status: DC

## 2023-03-20 NOTE — Assessment & Plan Note (Signed)
 Stable exam with well controlled BP on metoprolol and diltiazem. Tolerating medications without concerns or side effects. Will continue to recommend low sodium diet and current regimen.

## 2023-03-20 NOTE — Assessment & Plan Note (Signed)
 Mild intermittent discomfort without limitation to ROM Can get imaging to further evaluate if worsening

## 2023-03-20 NOTE — Assessment & Plan Note (Signed)
 S/p gastric bypass [employer] not working - planning to do her own low calorie diet. Interested in [place] but it is not covered by insurance.

## 2023-03-20 NOTE — Progress Notes (Signed)
 Date:  03/20/2023   Name:  Judith Wood   DOB:  1955/09/07   MRN:  [id number]   Chief Complaint: Hypertension  Hypertension This is a chronic problem. The problem is controlled. Pertinent negatives include no chest pain, headaches, palpitations or shortness of breath.    Review of Systems  Constitutional:  Negative for fatigue and unexpected weight change.  HENT:  Negative for nosebleeds.   Eyes:  Negative for visual disturbance.  Respiratory:  Negative for cough, chest tightness, shortness of breath and wheezing.   Cardiovascular:  Negative for chest pain, palpitations and leg swelling.  Gastrointestinal:  Negative for abdominal pain, constipation and diarrhea.  Musculoskeletal:  Positive for arthralgias (left shoulder - mild intermittent).  Neurological:  Negative for dizziness, weakness, light-headedness and headaches.  Psychiatric/Behavioral:  Negative for dysphoric mood and sleep disturbance. The patient is not nervous/anxious.      Lab Results  Component Value Date   NA 129 (L) 10/02/2022   K 5.0 10/02/2022   CO2 24 10/02/2022   GLUCOSE 100 (H) 10/02/2022   BUN 20 10/02/2022   CREATININE 0.91 10/02/2022   CALCIUM 9.3 10/02/2022   EGFR 70 10/02/2022   GFRNONAA 78 03/12/2021   Lab Results  Component Value Date   CHOL 155 10/02/2022   HDL 39 (L) 10/02/2022   LDLCALC 82 10/02/2022   TRIG 203 (H) 10/02/2022   CHOLHDL 4.0 10/02/2022   Lab Results  Component Value Date   TSH 2.340 10/02/2022   Lab Results  Component Value Date   HGBA1C 5.7 (H) 10/02/2022   Lab Results  Component Value Date   WBC 4.6 10/02/2022   HGB 13.7 10/02/2022   HCT 40.8 10/02/2022   MCV 89 10/02/2022   PLT 364 10/02/2022   Lab Results  Component Value Date   ALT 70 (H) 10/02/2022   AST 49 (H) 10/02/2022   ALKPHOS 97 10/02/2022   BILITOT 0.4 10/02/2022   Lab Results  Component Value Date   VD25OH 67 03/12/2021     Patient Active Problem List   Diagnosis Date Noted    Acquired thrombophilia (HCC) 09/15/2022   Prediabetes 03/10/2022   OAB (overactive bladder) 10/25/2021   BMI 40.0-44.9, adult (HCC) 09/07/2021   Status post total hysterectomy 06/08/2021   Microcytic anemia 12/29/2020   Chronic left shoulder pain 04/05/2020   Benign essential HTN 10/01/2017   Seborrheic keratoses, inflamed 08/31/2016   Paroxysmal atrial fibrillation (HCC) 01/18/2016   Hyperlipidemia 01/18/2016   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   Vitamin D  deficiency 01/18/2016    Allergies  Allergen Reactions   Norco [Hydrocodone-Acetaminophen ] Itching   Tetracyclines & Related Itching and Rash    Past Surgical History:  Procedure Laterality Date   CHOLECYSTECTOMY     COLONOSCOPY WITH PROPOFOL  N/A 09/29/2021   Procedure: COLONOSCOPY WITH PROPOFOL ;  Surgeon: Therisa Bi, MD;  Location: [facility] ENDOSCOPY;  Service: Gastroenterology;  Laterality: N/A;   ROUX-EN-Y GASTRIC BYPASS  2009   TOTAL ABDOMINAL HYSTERECTOMY  2000   WRIST SURGERY      Social History   Tobacco Use   Smoking status: Never   Smokeless tobacco: Never  Vaping Use   Vaping status: Never Used  Substance Use Topics   Alcohol use: No   Drug use: No     Medication list has been reviewed and updated.  Current Meds  Medication Sig   apixaban  (ELIQUIS ) 5 MG TABS tablet Take 1 tablet by mouth twice daily   b complex  vitamins tablet Take 1 tablet by mouth daily.   Calcium Citrate-Vitamin D  (CALCIUM CITRATE + D PO) Take 2 tablets by mouth daily.   CHOLECALCIFEROL PO Take 5,000 Units by mouth daily.   D-MANNOSE PO Take 1 capsule by mouth daily.   Fe Bisgly-Vit C-Vit B12-FA (GENTLE IRON PO) Take 25 mg by mouth in the morning and at bedtime.   flecainide  (TAMBOCOR ) 100 MG tablet Take 1 tablet (100 mg total) by mouth 2 (two) times daily.   glucosamine-chondroitin 500-400 MG tablet Take 1 tablet by mouth 3 (three) times daily.   magnesium 30 MG tablet Take 30 mg by mouth 2 (two) times daily.    metoprolol  succinate (TOPROL -XL) 50 MG 24 hr tablet Take 1 tablet (50 mg total) by mouth daily. Take with or immediately following a meal.   [DISCONTINUED] diltiazem  (DILT-XR) 180 MG 24 hr capsule Take 1 capsule (180 mg total) by mouth daily.       09/07/2021   10:51 AM 12/29/2020    1:52 PM 04/05/2020    8:43 AM  GAD 7 : Generalized Anxiety Score  Nervous, Anxious, on Edge 0 0 0  Control/stop worrying 0 0 0  Worry too much - different things 0 0 0  Trouble relaxing 0 0 0  Restless 0 0 0  Easily annoyed or irritable 0 0 0  Afraid - awful might happen 0 0 0  Total GAD 7 Score 0 0 0  Anxiety Difficulty Not difficult at all Not difficult at all        03/20/2023    9:37 AM 09/20/2022   10:07 AM 09/07/2021   10:51 AM  Depression screen PHQ 2/9  Decreased Interest 0 0 1  Down, Depressed, Hopeless 0 0 1  PHQ - 2 Score 0 0 2  Altered sleeping  0 1  Tired, decreased energy  0 1  Change in appetite  0 0  Feeling bad or failure about yourself   0 0  Trouble concentrating  0 0  Moving slowly or fidgety/restless  0 0  Suicidal thoughts  0 0  PHQ-9 Score  0 4  Difficult doing work/chores  Not difficult at all Not difficult at all    BP Readings from Last 3 Encounters:  03/20/23 120/76  02/23/23 110/70  09/21/22 (!) 149/89    Physical Exam Vitals and nursing note reviewed.  Constitutional:      General: She is not in acute distress.    Appearance: She is well-developed.  HENT:     Head: Normocephalic and atraumatic.  Cardiovascular:     Rate and Rhythm: Normal rate and regular rhythm.  Pulmonary:     Effort: Pulmonary effort is normal. No respiratory distress.     Breath sounds: No wheezing or rhonchi.  Musculoskeletal:     Right shoulder: Normal.     Left shoulder: Tenderness (at [facility] point) present. Normal range of motion.     Cervical back: Normal range of motion.     Right lower leg: No edema.     Left lower leg: No edema.  Skin:    General: Skin is warm and dry.      Findings: No rash.  Neurological:     General: No focal deficit present.     Mental Status: She is alert and oriented to person, place, and time.  Psychiatric:        Mood and Affect: Mood normal.        Behavior: Behavior normal.  Wt Readings from Last 3 Encounters:  03/20/23 250 lb (113.4 kg)  02/23/23 251 lb 2 oz (113.9 kg)  09/21/22 248 lb (112.5 kg)    BP 120/76   Pulse (!) 56   Ht 5' 5 (1.651 m)   Wt 250 lb (113.4 kg)   SpO2 97%   BMI 41.60 kg/m   Assessment and Plan:  Problem List Items Addressed This Visit       Unprioritized   Paroxysmal atrial fibrillation (HCC) (Chronic)   Relevant Medications   diltiazem  (DILT-XR) 180 MG 24 hr capsule   Other Relevant Orders   CBC with Differential/Platelet   Comprehensive metabolic panel   Benign essential HTN (Chronic)   Stable exam with well controlled BP on metoprolol  and diltiazem . Tolerating medications without concerns or side effects. Will continue to recommend low sodium diet and current regimen.       Relevant Medications   diltiazem  (DILT-XR) 180 MG 24 hr capsule   Other Relevant Orders   Comprehensive metabolic panel   Chronic left shoulder pain   Mild intermittent discomfort without limitation to ROM Can get imaging to further evaluate if worsening      BMI 40.0-44.9, adult [facility])   S/p gastric bypass [employer] not working - planning to do her own low calorie diet. Interested in [place] but it is not covered by insurance.      Prediabetes - Primary   Resume dietary changes now that the holidays are over. Will recheck A1C today      Relevant Orders   Hemoglobin A1c    Return in about 6 months (around 09/17/2023) for Medicare annual.    Leita HILARIO Adie, MD [facility] Medicine [place]

## 2023-03-20 NOTE — Assessment & Plan Note (Signed)
 Resume dietary changes now that the holidays are over. Will recheck A1C today

## 2023-03-24 LAB — COMPREHENSIVE METABOLIC PANEL
ALT: 42 [IU]/L — ABNORMAL HIGH (ref 0–32)
AST: 35 [IU]/L (ref 0–40)
Albumin: 4 g/dL (ref 3.9–4.9)
Alkaline Phosphatase: 89 [IU]/L (ref 44–121)
BUN/Creatinine Ratio: 23 (ref 12–28)
BUN: 21 mg/dL (ref 8–27)
Bilirubin Total: 0.4 mg/dL (ref 0.0–1.2)
CO2: 25 mmol/L (ref 20–29)
Calcium: 8.8 mg/dL (ref 8.7–10.3)
Chloride: 97 mmol/L (ref 96–106)
Creatinine, Ser: 0.91 mg/dL (ref 0.57–1.00)
Globulin, Total: 2.7 g/dL (ref 1.5–4.5)
Glucose: 90 mg/dL (ref 70–99)
Potassium: 4.6 mmol/L (ref 3.5–5.2)
Sodium: 135 mmol/L (ref 134–144)
Total Protein: 6.7 g/dL (ref 6.0–8.5)
eGFR: 70 mL/min/{1.73_m2} (ref >59)

## 2023-03-24 LAB — CBC WITH DIFFERENTIAL/PLATELET
Basophils Absolute: 0 10*3/uL (ref 0.0–0.2)
Basos: 1 %
EOS (ABSOLUTE): 0 10*3/uL (ref 0.0–0.4)
Eos: 0 %
Hematocrit: 41.2 % (ref 34.0–46.6)
Hemoglobin: 13.6 g/dL (ref 11.1–15.9)
Immature Grans (Abs): 0 10*3/uL (ref 0.0–0.1)
Immature Granulocytes: 0 %
Lymphocytes Absolute: 1.7 10*3/uL (ref 0.7–3.1)
Lymphs: 40 %
MCH: 31.1 pg (ref 26.6–33.0)
MCHC: 33 g/dL (ref 31.5–35.7)
MCV: 94 fL (ref 79–97)
Monocytes Absolute: 0.5 10*3/uL (ref 0.1–0.9)
Monocytes: 11 %
Neutrophils Absolute: 2.1 10*3/uL (ref 1.4–7.0)
Neutrophils: 48 %
Platelets: 310 10*3/uL (ref 150–450)
RBC: 4.38 x10E6/uL (ref 3.77–5.28)
RDW: 12.5 % (ref 11.7–15.4)
WBC: 4.3 10*3/uL (ref 3.4–10.8)

## 2023-03-24 LAB — HEMOGLOBIN A1C
Est. average glucose Bld gHb Est-mCnc: 120 mg/dL
Hgb A1c MFr Bld: 5.8 % — ABNORMAL HIGH (ref 4.8–5.6)

## 2023-03-25 ENCOUNTER — Encounter: Payer: Self-pay | Admitting: Internal Medicine

## 2023-05-04 ENCOUNTER — Other Ambulatory Visit: Payer: Self-pay | Admitting: Cardiovascular Disease

## 2023-07-16 ENCOUNTER — Encounter: Payer: Self-pay | Admitting: Internal Medicine

## 2023-07-16 ENCOUNTER — Ambulatory Visit (INDEPENDENT_AMBULATORY_CARE_PROVIDER_SITE_OTHER): Admitting: Internal Medicine

## 2023-07-16 VITALS — BP 124/78 | HR 66 | Ht 65.0 in | Wt 255.4 lb

## 2023-07-16 DIAGNOSIS — I10 Essential (primary) hypertension: Secondary | ICD-10-CM

## 2023-07-16 DIAGNOSIS — R1905 Periumbilic swelling, mass or lump: Secondary | ICD-10-CM

## 2023-07-16 MED ORDER — DILTIAZEM HCL ER 180 MG PO CP24: 180.0000 mg | ORAL_CAPSULE | Freq: Every day | ORAL | 1 refills | Status: DC

## 2023-07-16 NOTE — Assessment & Plan Note (Signed)
 Blood pressure is well controlled.  Current medication is diltiazem  Will continue same regimen along with efforts to limit dietary sodium.

## 2023-07-16 NOTE — Progress Notes (Signed)
 Date:  07/16/2023   Name:  Judith Wood   DOB:  11-Aug-1955   MRN:  [id number]   Chief Complaint: Mass (Patient said she has a mass located on her abdominal/naval area, abdominal pain, first noticed it a couple months ago, no change in size, hurts a lot more when standing for long periods)  Abdominal Pain This is a new problem. The onset quality is undetermined. Pertinent negatives include no constipation, diarrhea, fever, frequency, nausea or vomiting.    Review of Systems  Constitutional:  Negative for chills, fatigue and fever.  Respiratory:  Negative for chest tightness and shortness of breath.   Gastrointestinal:  Positive for abdominal pain. Negative for constipation, diarrhea, nausea and vomiting.  Genitourinary:  Negative for frequency and urgency.  Psychiatric/Behavioral:  Negative for dysphoric mood and sleep disturbance. The patient is not nervous/anxious.      Lab Results  Component Value Date   NA 135 03/23/2023   K 4.6 03/23/2023   CO2 25 03/23/2023   GLUCOSE 90 03/23/2023   BUN 21 03/23/2023   CREATININE 0.91 03/23/2023   CALCIUM 8.8 03/23/2023   EGFR 70 03/23/2023   GFRNONAA 78 03/12/2021   Lab Results  Component Value Date   CHOL 155 10/02/2022   HDL 39 (L) 10/02/2022   LDLCALC 82 10/02/2022   TRIG 203 (H) 10/02/2022   CHOLHDL 4.0 10/02/2022   Lab Results  Component Value Date   TSH 2.340 10/02/2022   Lab Results  Component Value Date   HGBA1C 5.8 (H) 03/23/2023   Lab Results  Component Value Date   WBC 4.3 03/23/2023   HGB 13.6 03/23/2023   HCT 41.2 03/23/2023   MCV 94 03/23/2023   PLT 310 03/23/2023   Lab Results  Component Value Date   ALT 42 (H) 03/23/2023   AST 35 03/23/2023   ALKPHOS 89 03/23/2023   BILITOT 0.4 03/23/2023   Lab Results  Component Value Date   VD25OH 67 03/12/2021     Patient Active Problem List   Diagnosis Date Noted   Acquired thrombophilia (HCC) 09/15/2022   Prediabetes 03/10/2022   OAB (overactive  bladder) 10/25/2021   BMI 40.0-44.9, adult (HCC) 09/07/2021   Status post total hysterectomy 06/08/2021   Microcytic anemia 12/29/2020   Chronic left shoulder pain 04/05/2020   Benign essential HTN 10/01/2017   Seborrheic keratoses, inflamed 08/31/2016   Paroxysmal atrial fibrillation (HCC) 01/18/2016   Hyperlipidemia 01/18/2016   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   Vitamin D  deficiency 01/18/2016    Allergies  Allergen Reactions   Norco [Hydrocodone-Acetaminophen ] Itching   Tetracyclines & Related Itching and Rash    Past Surgical History:  Procedure Laterality Date   CHOLECYSTECTOMY     COLONOSCOPY WITH PROPOFOL  N/A 09/29/2021   Procedure: COLONOSCOPY WITH PROPOFOL ;  Surgeon: Luke Salaam, MD;  Location: [facility] ENDOSCOPY;  Service: Gastroenterology;  Laterality: N/A;   ROUX-EN-Y GASTRIC BYPASS  2009   TOTAL ABDOMINAL HYSTERECTOMY  2000   WRIST SURGERY      Social History   Tobacco Use   Smoking status: Never   Smokeless tobacco: Never  Vaping Use   Vaping status: Never Used  Substance Use Topics   Alcohol use: No   Drug use: No     Medication list has been reviewed and updated.  Current Meds  Medication Sig   apixaban  (ELIQUIS ) 5 MG TABS tablet Take 1 tablet by mouth twice daily   b complex vitamins tablet Take 1 tablet by  mouth daily.   Calcium Citrate-Vitamin D  (CALCIUM CITRATE + D PO) Take 2 tablets by mouth daily.   CHOLECALCIFEROL PO Take 5,000 Units by mouth daily.   D-MANNOSE PO Take 1 capsule by mouth daily.   Fe Bisgly-Vit C-Vit B12-FA (GENTLE IRON PO) Take 25 mg by mouth in the morning and at bedtime.   flecainide  (TAMBOCOR ) 100 MG tablet Take 1 tablet by mouth twice daily   glucosamine-chondroitin 500-400 MG tablet Take 1 tablet by mouth 3 (three) times daily.   magnesium 30 MG tablet Take 30 mg by mouth 2 (two) times daily.   metoprolol  succinate (TOPROL -XL) 50 MG 24 hr tablet TAKE 1 TABLET BY MOUTH ONCE DAILY WITH OR IMMEDIATLY FOLLOWING  A MEAL   [DISCONTINUED] diltiazem  (DILT-XR) 180 MG 24 hr capsule Take 1 capsule (180 mg total) by mouth daily.       09/07/2021   10:51 AM 12/29/2020    1:52 PM 04/05/2020    8:43 AM  GAD 7 : Generalized Anxiety Score  Nervous, Anxious, on Edge 0 0 0  Control/stop worrying 0 0 0  Worry too much - different things 0 0 0  Trouble relaxing 0 0 0  Restless 0 0 0  Easily annoyed or irritable 0 0 0  Afraid - awful might happen 0 0 0  Total GAD 7 Score 0 0 0  Anxiety Difficulty Not difficult at all Not difficult at all        03/20/2023    9:37 AM 09/20/2022   10:07 AM 09/07/2021   10:51 AM  Depression screen PHQ 2/9  Decreased Interest 0 0 1  Down, Depressed, Hopeless 0 0 1  PHQ - 2 Score 0 0 2  Altered sleeping  0 1  Tired, decreased energy  0 1  Change in appetite  0 0  Feeling bad or failure about yourself   0 0  Trouble concentrating  0 0  Moving slowly or fidgety/restless  0 0  Suicidal thoughts  0 0  PHQ-9 Score  0 4  Difficult doing work/chores  Not difficult at all Not difficult at all    BP Readings from Last 3 Encounters:  07/16/23 124/78  03/20/23 120/76  02/23/23 110/70    Physical Exam Vitals and nursing note reviewed.  Constitutional:      General: She is not in acute distress.    Appearance: She is well-developed.  HENT:     Head: Normocephalic and atraumatic.  Pulmonary:     Effort: Pulmonary effort is normal. No respiratory distress.  Abdominal:     General: Abdomen is protuberant.     Palpations: There is mass.       Comments: Likely ventral hernia - becomes softer when supine and more prominent when standing  Skin:    General: Skin is warm and dry.     Findings: No rash.  Neurological:     Mental Status: She is alert and oriented to person, place, and time.  Psychiatric:        Mood and Affect: Mood normal.        Behavior: Behavior normal.     Wt Readings from Last 3 Encounters:  07/16/23 255 lb 6 oz (115.8 kg)  03/20/23 250 lb (113.4  kg)  02/23/23 251 lb 2 oz (113.9 kg)    BP 124/78   Pulse 66   Ht 5\' 5"  (1.651 m)   Wt 255 lb 6 oz (115.8 kg)   SpO2 94%  BMI 42.50 kg/m   Assessment and Plan:  Problem List Items Addressed This Visit       Unprioritized   Benign essential HTN (Chronic)   Blood pressure is well controlled.  Current medication is diltiazem  Will continue same regimen along with efforts to limit dietary sodium.       Relevant Medications   diltiazem  (DILT-XR) 180 MG 24 hr capsule   Other Visit Diagnoses       Abdominal wall mass of periumbilical region    -  Primary   suspect ventral hernia will get CT to further characterize then refer as indicated   Relevant Orders   CT ABDOMEN PELVIS W CONTRAST       No follow-ups on file.    Sheron Dixons, MD [facility] Medicine [place]

## 2023-07-20 ENCOUNTER — Ambulatory Visit
Admission: RE | Admit: 2023-07-20 | Discharge: 2023-07-20 | Disposition: A | Discharge status: Home / Self Care | Source: Ambulatory Visit | Attending: Internal Medicine | Admitting: Internal Medicine

## 2023-07-20 DIAGNOSIS — R1905 Periumbilic swelling, mass or lump: Secondary | ICD-10-CM | POA: Insufficient documentation

## 2023-07-20 MED ORDER — IOHEXOL 9 MG/ML PO SOLN
500.0000 mL | ORAL | Status: AC
  Administered 2023-07-20 (×2): 500 mL via ORAL

## 2023-07-20 MED ORDER — IOHEXOL 300 MG/ML  SOLN
100.0000 mL | Freq: Once | INTRAMUSCULAR | Status: AC | PRN
  Administered 2023-07-20: 100 mL via INTRAVENOUS

## 2023-07-22 ENCOUNTER — Ambulatory Visit: Payer: Self-pay | Admitting: Internal Medicine

## 2023-07-23 ENCOUNTER — Other Ambulatory Visit: Payer: Self-pay | Admitting: Internal Medicine

## 2023-07-23 DIAGNOSIS — K439 Ventral hernia without obstruction or gangrene: Secondary | ICD-10-CM | POA: Insufficient documentation

## 2023-07-23 NOTE — Progress Notes (Unsigned)
 Date:  07/23/2023   Name:  Judith Wood   DOB:  1955-12-27   MRN:  [id number]   Chief Complaint: No chief complaint on file.  HPI  Review of Systems   Lab Results  Component Value Date   NA 135 03/23/2023   K 4.6 03/23/2023   CO2 25 03/23/2023   GLUCOSE 90 03/23/2023   BUN 21 03/23/2023   CREATININE 0.91 03/23/2023   CALCIUM 8.8 03/23/2023   EGFR 70 03/23/2023   GFRNONAA 78 03/12/2021   Lab Results  Component Value Date   CHOL 155 10/02/2022   HDL 39 (L) 10/02/2022   LDLCALC 82 10/02/2022   TRIG 203 (H) 10/02/2022   CHOLHDL 4.0 10/02/2022   Lab Results  Component Value Date   TSH 2.340 10/02/2022   Lab Results  Component Value Date   HGBA1C 5.8 (H) 03/23/2023   Lab Results  Component Value Date   WBC 4.3 03/23/2023   HGB 13.6 03/23/2023   HCT 41.2 03/23/2023   MCV 94 03/23/2023   PLT 310 03/23/2023   Lab Results  Component Value Date   ALT 42 (H) 03/23/2023   AST 35 03/23/2023   ALKPHOS 89 03/23/2023   BILITOT 0.4 03/23/2023   Lab Results  Component Value Date   VD25OH 67 03/12/2021     Patient Active Problem List   Diagnosis Date Noted   Acquired thrombophilia (HCC) 09/15/2022   Prediabetes 03/10/2022   OAB (overactive bladder) 10/25/2021   BMI 40.0-44.9, adult (HCC) 09/07/2021   Status post total hysterectomy 06/08/2021   Microcytic anemia 12/29/2020   Chronic left shoulder pain 04/05/2020   Benign essential HTN 10/01/2017   Seborrheic keratoses, inflamed 08/31/2016   Paroxysmal atrial fibrillation (HCC) 01/18/2016   Hyperlipidemia 01/18/2016   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   Vitamin D  deficiency 01/18/2016    Allergies  Allergen Reactions   Norco [Hydrocodone-Acetaminophen ] Itching   Tetracyclines & Related Itching and Rash    Past Surgical History:  Procedure Laterality Date   CHOLECYSTECTOMY     COLONOSCOPY WITH PROPOFOL  N/A 09/29/2021   Procedure: COLONOSCOPY WITH PROPOFOL ;  Surgeon: Luke Salaam, MD;   Location: [facility] ENDOSCOPY;  Service: Gastroenterology;  Laterality: N/A;   ROUX-EN-Y GASTRIC BYPASS  2009   TOTAL ABDOMINAL HYSTERECTOMY  2000   WRIST SURGERY      Social History   Tobacco Use   Smoking status: Never   Smokeless tobacco: Never  Vaping Use   Vaping status: Never Used  Substance Use Topics   Alcohol use: No   Drug use: No     Medication list has been reviewed and updated.  No outpatient medications have been marked as taking for the 07/23/23 encounter (Orders Only) with Sheron Dixons, MD.       09/07/2021   10:51 AM 12/29/2020    1:52 PM 04/05/2020    8:43 AM  GAD 7 : Generalized Anxiety Score  Nervous, Anxious, on Edge 0 0 0  Control/stop worrying 0 0 0  Worry too much - different things 0 0 0  Trouble relaxing 0 0 0  Restless 0 0 0  Easily annoyed or irritable 0 0 0  Afraid - awful might happen 0 0 0  Total GAD 7 Score 0 0 0  Anxiety Difficulty Not difficult at all Not difficult at all        03/20/2023    9:37 AM 09/20/2022   10:07 AM 09/07/2021   10:51 AM  Depression screen PHQ 2/9  Decreased Interest 0 0 1  Down, Depressed, Hopeless 0 0 1  PHQ - 2 Score 0 0 2  Altered sleeping  0 1  Tired, decreased energy  0 1  Change in appetite  0 0  Feeling bad or failure about yourself   0 0  Trouble concentrating  0 0  Moving slowly or fidgety/restless  0 0  Suicidal thoughts  0 0  PHQ-9 Score  0 4  Difficult doing work/chores  Not difficult at all Not difficult at all    BP Readings from Last 3 Encounters:  07/16/23 124/78  03/20/23 120/76  02/23/23 110/70    Physical Exam  Wt Readings from Last 3 Encounters:  07/16/23 255 lb 6 oz (115.8 kg)  03/20/23 250 lb (113.4 kg)  02/23/23 251 lb 2 oz (113.9 kg)    There were no vitals taken for this visit.  Assessment and Plan:  Problem List Items Addressed This Visit   None   No follow-ups on file.    Sheron Dixons, MD [facility] Primary Care and Sports Medicine  [place]

## 2023-07-23 NOTE — Telephone Encounter (Signed)
 Please review. JM

## 2023-07-26 ENCOUNTER — Encounter: Payer: Self-pay | Admitting: General Surgery

## 2023-07-26 ENCOUNTER — Ambulatory Visit (INDEPENDENT_AMBULATORY_CARE_PROVIDER_SITE_OTHER): Admitting: General Surgery

## 2023-07-26 VITALS — BP 107/70 | HR 79 | Ht 65.0 in | Wt 253.0 lb

## 2023-07-26 DIAGNOSIS — Z6841 Body Mass Index (BMI) 40.0 and over, adult: Secondary | ICD-10-CM | POA: Diagnosis not present

## 2023-07-26 DIAGNOSIS — K439 Ventral hernia without obstruction or gangrene: Secondary | ICD-10-CM | POA: Diagnosis not present

## 2023-07-26 NOTE — Patient Instructions (Signed)
 We will send a referral to Bariatrics at [facility]. They will call you to schedule this appointment.   Follow-up with our office as needed.  Please call and ask to speak with a nurse if you develop questions or concerns.   Hernia, Adult     A hernia happens when an organ or tissue inside your body pushes out through a weak spot in the muscles of your belly. This makes a bulge. The bulge may be: In a scar from surgery. This type of bulge is called an incisional hernia. Near your belly button. This type is called an umbilical hernia. In your groin, which is the area where your leg meets your lower belly. This type is called an inguinal hernia. If you're female, this type could also be in your scrotum. In your upper thigh. This type is called a femoral hernia. Inside your belly. This type is called a hiatal hernia. It happens when your stomach slides above your diaphragm, which is the muscle between your belly and your chest. What are the causes? You may get a hernia if: You lift heavy things. You cough over a long period of time. You have trouble pooping, also called constipation. Trouble pooping can lead to straining. There's a cut from surgery in your belly. You have a problem that's present at birth. There's fluid around your belly. You're female and have a testicle that hasn't moved down into your scrotum. You may be at greater risk for hernia if: You smoke. You're very overweight. What are the signs or symptoms? The main symptom is a bulge, but the bulge may not always be seen. It may grow bigger or be easier to see when you cough or strain, such as when you lift something heavy. If you can push the bulge back into your belly, it may not cause pain. If you can't push it back into your belly, it may lose its blood supply. This can cause: Pain. Fever. Nausea and vomiting. Swelling. Trouble pooping. How is this diagnosed? A hernia may be diagnosed based on your symptoms, medical history,  and an exam. Your health care provider may ask you to cough or move in a way that makes the bulge easier to see. You may also have tests done. These may include: X-rays. Ultrasound. CT scan. How is this treated? A hernia that's small and painless may not need to be treated. A hernia that's large or painful may be treated with surgery. Surgery involves pushing the bulge back into place and fixing the weak area of the muscle or belly. Follow these instructions at home: Activity Try not to strain. You may have to avoid lifting. Ask how much weight you can safely lift. If you lift something heavy, use your leg muscles. Do not use your back muscles to lift. Prevent trouble pooping You may need to take these actions to prevent or treat trouble pooping: Drink enough fluid to keep your pee (urine) pale yellow. Take medicines to help you poop. Eat foods high in fiber. These include beans, whole grains, and fresh fruits and vegetables. General instructions When you cough, try to cough gently. Try to push the bulge back in by very gently pressing on it when you're lying down. Do not try to force it back in if it won't push in easily. If you're overweight, work with your provider to lose weight safely. Do not smoke, vape, or use products with nicotine or tobacco in them. If you need help quitting, talk with your  provider. If you're going to have surgery, watch your hernia for changes in shape, size, or color. Tell your provider about any changes. Contact a health care provider if: You get new pain, swelling, or redness near your hernia. You have trouble pooping. Your poop is harder or larger than normal. You have a fever or chills. You have nausea or vomiting. Your hernia can't be pushed in. Get help right away if: You have belly pain that gets worse. Your hernia: Changes in shape or size. Changes color. Feels hard or hurts when you touch it. These symptoms may be an emergency. Call 911  right away. Do not wait to see if the symptoms will go away. Do not drive yourself to the hospital. This information is not intended to replace advice given to you by your health care provider. Make sure you discuss any questions you have with your health care provider. Document Revised: 11/23/2022 Document Reviewed: 05/16/2022 Elsevier Patient Education  2024 [employer].

## 2023-08-01 NOTE — Progress Notes (Addendum)
 Patient ID: Judith Wood, female   DOB: 1955/09/05, 68 y.o.   MRN: [id number] CC: Abdominal Wall Hernia History of Present Illness Judith Wood is a 68 y.o. female with past medical history significant for obesity status post Roux-en-Y gastric bypass who presents in consultation for abdominal wall hernia.  The patient reports that she has had lower abdominal pain that starts in the left lower quadrant and extends across her abdomen.  She said this will happen intermittently but mostly when she moves around or has to get up.  She says sometimes she feels like there is a bulge in the left lower quadrant.  She denies any obstructive symptoms and denies any overlying skin changes.  The pain is described as sharp and radiating.  Of note the patient has a history of Roux-en-Y gastric bypass.  She says that she did lose some weight with this but has subsequently regained most of her weight.  .  Past Medical History Past Medical History:  Diagnosis Date   Abnormal mammogram of right breast 01/18/2016   Afib (HCC)    Dysrhythmia    Hypertension        Past Surgical History:  Procedure Laterality Date   APPENDECTOMY  1974   CHOLECYSTECTOMY  2005   COLONOSCOPY WITH PROPOFOL  N/A 09/29/2021   Procedure: COLONOSCOPY WITH PROPOFOL ;  Surgeon: Luke Salaam, MD;  Location: [facility] ENDOSCOPY;  Service: Gastroenterology;  Laterality: N/A;   ROUX-EN-Y GASTRIC BYPASS  2009   TOTAL ABDOMINAL HYSTERECTOMY  2000   WRIST SURGERY Right 2017    Allergies  Allergen Reactions   Norco [Hydrocodone-Acetaminophen ] Itching   Tetracyclines & Related Itching and Rash    Current Outpatient Medications  Medication Sig Dispense Refill   apixaban  (ELIQUIS ) 5 MG TABS tablet Take 1 tablet by mouth twice daily 180 tablet 1   b complex vitamins tablet Take 1 tablet by mouth daily.     Calcium Citrate-Vitamin D  (CALCIUM CITRATE + D PO) Take 2 tablets by mouth daily.     CHOLECALCIFEROL PO Take 5,000 Units by mouth daily.      D-MANNOSE PO Take 1 capsule by mouth daily.     diltiazem  (DILT-XR) 180 MG 24 hr capsule Take 1 capsule (180 mg total) by mouth daily. 90 capsule 1   Fe Bisgly-Vit C-Vit B12-FA (GENTLE IRON PO) Take 25 mg by mouth in the morning and at bedtime.     flecainide  (TAMBOCOR ) 100 MG tablet Take 1 tablet by mouth twice daily 180 tablet 3   glucosamine-chondroitin 500-400 MG tablet Take 1 tablet by mouth 3 (three) times daily.     magnesium 30 MG tablet Take 30 mg by mouth 2 (two) times daily.     metoprolol  succinate (TOPROL -XL) 50 MG 24 hr tablet TAKE 1 TABLET BY MOUTH ONCE DAILY WITH OR IMMEDIATLY FOLLOWING A MEAL 90 tablet 3   No current facility-administered medications for this visit.    Family History Family History  Problem Relation Age of Onset   Hypertension Mother    Arrhythmia Mother        Pacemaker implant    Heart attack Father 73   Arrhythmia Sister    Arrhythmia Brother    Arrhythmia Brother    Breast cancer Neg Hx        Social History Social History   Tobacco Use   Smoking status: Never    Passive exposure: Never   Smokeless tobacco: Never  Vaping Use   Vaping status: Never Used  Substance Use  Topics   Alcohol use: No   Drug use: No        ROS Full ROS of systems performed and is otherwise negative there than what is stated in the HPI  Physical Exam Blood pressure 107/70, pulse 79, height 5\' 5"  (1.651 m), weight 253 lb (114.8 kg), SpO2 98%.  Alert and oriented x 3, normal work of breathing on room air, regular rate and rhythm, abdomen is soft, obese, some tenderness in the left lower quadrant, it is difficult to appreciate a large bulge given her body habitus, well-healed surgical scars, moving EXTR spontaneously.  Data Reviewed I independently reviewed her CT scan.  She has a small ventral hernia in the left lower quadrant as well as what looks like an umbilical hernia that is quite tiny.  These are both fat-containing.  I have personally reviewed  the patient's imaging and medical records.    Assessment/Plan    68 year old female with past medical history of morbid obesity who presents in consultation for abdominal wall hernias.  I did discuss with her that some of her pain in her left lower quadrant may be attributable to her hernia.  However, there is only fat in her hernia and right now her BMI is prohibitive to entertain any operative intervention.  She is quite frustrated with her weight loss after her bypass and asked me to review the CT scan to see if there was any concerns for technical error that may contribute to her weight regain.  I told her that while I am not a bariatric surgeon anatomically it looks like her bypass is appropriate.  However, I did offer her referral to a bariatric surgeon for evaluation and to see if there are any other surgical options such as a duodenal switch that may improve her weight loss.  I also encouraged her to discuss with her primary care doctor if any of the new GLP-1 agonist may be appropriate for her to aid in weight loss.  I discussed with her that if she is able to lose weight and get to a BMI of under 40 then I would be happy to repair her hernia or if she sees a bariatric surgeon they may be able to do this as well.  If she elects to follow-up with me for this she will give us  a call and schedule it.  We will refer her to bariatric surgery at [facility]

## 2023-08-15 NOTE — Progress Notes (Signed)
 Referral to [facility] has been sent to [phone]. Phone number [phone]. Per [facility] they have received the referral and it is in review. The will contact the patient to schedule.  Attempted to call the patient to let her know but her phone was not in service.

## 2023-08-24 ENCOUNTER — Other Ambulatory Visit: Payer: Self-pay

## 2023-08-24 DIAGNOSIS — K439 Ventral hernia without obstruction or gangrene: Secondary | ICD-10-CM

## 2023-08-26 ENCOUNTER — Other Ambulatory Visit: Payer: Self-pay | Admitting: Cardiovascular Disease

## 2023-08-27 NOTE — Telephone Encounter (Signed)
 Prescription refill request for Eliquis  received. Indication:afib Last office visit:12/24 Scr:0.91  1/25 Age: 68 Weight:114.8  kg  Prescription refilled

## 2023-09-10 ENCOUNTER — Ambulatory Visit
Admission: RE | Admit: 2023-09-10 | Discharge: 2023-09-10 | Disposition: A | Discharge status: Home / Self Care | Source: Ambulatory Visit | Attending: Emergency Medicine | Admitting: Emergency Medicine

## 2023-09-10 VITALS — BP 122/81 | HR 78 | Temp 97.0°F | Resp 18

## 2023-09-10 DIAGNOSIS — R300 Dysuria: Secondary | ICD-10-CM | POA: Insufficient documentation

## 2023-09-10 LAB — POCT URINALYSIS DIP (MANUAL ENTRY)
Bilirubin, UA: NEGATIVE
Glucose, UA: NEGATIVE mg/dL
Ketones, POC UA: NEGATIVE mg/dL
Nitrite, UA: NEGATIVE
Protein Ur, POC: NEGATIVE mg/dL
Spec Grav, UA: 1.01 (ref 1.010–1.025)
Urobilinogen, UA: 0.2 U/dL
pH, UA: 6 (ref 5.0–8.0)

## 2023-09-10 MED ORDER — CEPHALEXIN 500 MG PO CAPS: 500.0000 mg | ORAL_CAPSULE | Freq: Three times a day (TID) | ORAL | 0 refills | Status: AC

## 2023-09-10 NOTE — Discharge Instructions (Addendum)
 Take the antibiotic as directed.  The urine culture is pending.  We will call you if it shows the need to change or discontinue your antibiotic.    Follow up with your primary care provider if your symptoms are not improving.

## 2023-09-10 NOTE — ED Provider Notes (Signed)
 Judith Wood    CSN: [id number] Arrival date & time: 09/10/23  1051      History   Chief Complaint Chief Complaint  Patient presents with   Urinary Frequency    UTI - Entered by patient    HPI Judith Wood is a 68 y.o. female.  Patient presents with dysuria, urinary frequency, urinary urgency x 11 days.  No fever, abdominal pain, flank pain, hematuria, vaginal discharge, pelvic pain.  No OTC medication taken today.  The history is provided by the patient and medical records.    Past Medical History:  Diagnosis Date   Abnormal mammogram of right breast 01/18/2016   Afib (HCC)    Dysrhythmia    Hypertension     Patient Active Problem List   Diagnosis Date Noted   Ventral hernia without obstruction or gangrene 07/23/2023   Acquired thrombophilia (HCC) 09/15/2022   Prediabetes 03/10/2022   OAB (overactive bladder) 10/25/2021   BMI 40.0-44.9, adult (HCC) 09/07/2021   Status post total hysterectomy 06/08/2021   Microcytic anemia 12/29/2020   Chronic left shoulder pain 04/05/2020   Benign essential HTN 10/01/2017   Seborrheic keratoses, inflamed 08/31/2016   Paroxysmal atrial fibrillation (HCC) 01/18/2016   Hyperlipidemia 01/18/2016   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   Vitamin D  deficiency 01/18/2016    Past Surgical History:  Procedure Laterality Date   APPENDECTOMY  1974   CHOLECYSTECTOMY  2005   COLONOSCOPY WITH PROPOFOL  N/A 09/29/2021   Procedure: COLONOSCOPY WITH PROPOFOL ;  Surgeon: Therisa Bi, MD;  Location: [facility] ENDOSCOPY;  Service: Gastroenterology;  Laterality: N/A;   ROUX-EN-Y GASTRIC BYPASS  2009   TOTAL ABDOMINAL HYSTERECTOMY  2000   WRIST SURGERY Right 2017    OB History   No obstetric history on file.      Home Medications    Prior to Admission medications   Medication Sig Start Date End Date Taking? Authorizing Provider  cephALEXin  (KEFLEX ) 500 MG capsule Take 1 capsule (500 mg total) by mouth 3 (three) times daily  for 5 days. 09/10/23 09/15/23 Yes Corlis Burnard DEL, NP  apixaban  (ELIQUIS ) 5 MG TABS tablet Take 1 tablet by mouth twice daily 08/27/23   Gollan, Timothy J, MD  b complex vitamins tablet Take 1 tablet by mouth daily.    [provider]  Calcium Citrate-Vitamin D  (CALCIUM CITRATE + D PO) Take 2 tablets by mouth daily.    [provider]  CHOLECALCIFEROL PO Take 5,000 Units by mouth daily.    [provider]  D-MANNOSE PO Take 1 capsule by mouth daily.    [provider]  diltiazem  (DILT-XR) 180 MG 24 hr capsule Take 1 capsule (180 mg total) by mouth daily. 07/16/23   Justus Leita DEL, MD  Fe Bisgly-Vit C-Vit B12-FA (GENTLE IRON PO) Take 25 mg by mouth in the morning and at bedtime.    [provider]  flecainide  (TAMBOCOR ) 100 MG tablet Take 1 tablet by mouth twice daily 05/04/23   Gollan, Timothy J, MD  glucosamine-chondroitin 500-400 MG tablet Take 1 tablet by mouth 3 (three) times daily.    [provider]  magnesium 30 MG tablet Take 30 mg by mouth 2 (two) times daily.    [provider]  metoprolol  succinate (TOPROL -XL) 50 MG 24 hr tablet TAKE 1 TABLET BY MOUTH ONCE DAILY WITH OR IMMEDIATLY FOLLOWING A MEAL 05/04/23   Gollan, Timothy J, MD    Family History Family History  Problem Relation Age of Onset  Hypertension Mother    Arrhythmia Mother        Pacemaker implant    Heart attack Father 38   Arrhythmia Sister    Arrhythmia Brother    Arrhythmia Brother    Breast cancer Neg Hx     Social History Social History   Tobacco Use   Smoking status: Never    Passive exposure: Never   Smokeless tobacco: Never  Vaping Use   Vaping status: Never Used  Substance Use Topics   Alcohol use: No   Drug use: No     Allergies   Norco [hydrocodone-acetaminophen ] and Tetracyclines & related   Review of Systems Review of Systems  Constitutional:  Negative for chills and fever.  Gastrointestinal:  Negative for abdominal pain,  blood in stool, constipation, diarrhea, nausea and vomiting.  Genitourinary:  Positive for dysuria, frequency and urgency. Negative for flank pain and hematuria.     Physical Exam Triage Vital Signs ED Triage Vitals  Encounter Vitals Group     BP 09/10/23 1104 122/81     Girls Systolic BP Percentile --      Girls Diastolic BP Percentile --      Boys Systolic BP Percentile --      Boys Diastolic BP Percentile --      Pulse Rate 09/10/23 1104 78     Resp 09/10/23 1104 18     Temp 09/10/23 1104 (!) 97 F (36.1 C)     Temp src --      SpO2 09/10/23 1104 98 %     Weight --      Height --      Head Circumference --      Peak Flow --      Pain Score 09/10/23 1059 0     Pain Loc --      Pain Education --      Exclude from Growth Chart --    No data found.  Updated Vital Signs BP 122/81   Pulse 78   Temp (!) 97 F (36.1 C)   Resp 18   SpO2 98%   Visual Acuity Right Eye Distance:   Left Eye Distance:   Bilateral Distance:    Right Eye Near:   Left Eye Near:    Bilateral Near:     Physical Exam Constitutional:      General: She is not in acute distress. HENT:     Mouth/Throat:     Mouth: Mucous membranes are moist.  Cardiovascular:     Rate and Rhythm: Normal rate and regular rhythm.  Pulmonary:     Effort: Pulmonary effort is normal. No respiratory distress.  Abdominal:     General: Bowel sounds are normal.     Palpations: Abdomen is soft.     Tenderness: There is no abdominal tenderness. There is no right CVA tenderness, left CVA tenderness, guarding or rebound.  Neurological:     Mental Status: She is alert.      UC Treatments / Results  Labs (all labs ordered are listed, but only abnormal results are displayed) Labs Reviewed  POCT URINALYSIS DIP (MANUAL ENTRY) - Abnormal; Notable for the following components:      Result Value   Blood, UA trace-intact (*)    Leukocytes, UA Small (1+) (*)    All other components within normal limits  URINE CULTURE     EKG   Radiology No results found.  Procedures Procedures (including critical care time)  Medications Ordered in UC  Medications - No data to display  Initial Impression / Assessment and Plan / UC Course  I have reviewed the triage vital signs and the nursing notes.  Pertinent labs & imaging results that were available during my care of the patient were reviewed by me and considered in my medical decision making (see chart for details).    Dysuria.  Treating with Keflex . Urine culture pending. Discussed with patient that we will call her if the urine culture shows the need to change or discontinue the antibiotic. Instructed her to follow-up with her PCP if she is not improving.  Education provided on dysuria.  Patient agrees to plan of care.     Final Clinical Impressions(s) / UC Diagnoses   Final diagnoses:  Dysuria     Discharge Instructions      Take the antibiotic as directed.  The urine culture is pending.  We will call you if it shows the need to change or discontinue your antibiotic.    Follow-up with your primary care provider if your symptoms are not improving.      ED Prescriptions     Medication Sig Dispense Auth. Provider   cephALEXin  (KEFLEX ) 500 MG capsule Take 1 capsule (500 mg total) by mouth 3 (three) times daily for 5 days. 15 capsule Corlis Burnard DEL, NP      PDMP not reviewed this encounter.   Corlis Burnard DEL, NP 09/10/23 1124

## 2023-09-10 NOTE — ED Triage Notes (Signed)
 Patient to Urgent Care with complaints of dysuria/ urinary frequency and urgency. Denies any fevers.   Symptoms started approx 11 days ago.   No otc meds today.

## 2023-09-11 ENCOUNTER — Ambulatory Visit: Admitting: Internal Medicine

## 2023-09-12 LAB — URINE CULTURE: Culture: 30000 — AB

## 2023-09-13 ENCOUNTER — Ambulatory Visit (HOSPITAL_COMMUNITY): Payer: Self-pay

## 2023-09-18 ENCOUNTER — Encounter: Payer: Self-pay | Admitting: Internal Medicine

## 2023-09-25 ENCOUNTER — Ambulatory Visit (INDEPENDENT_AMBULATORY_CARE_PROVIDER_SITE_OTHER): Payer: Self-pay | Admitting: Family Medicine

## 2023-09-25 ENCOUNTER — Encounter: Payer: Self-pay | Admitting: Family Medicine

## 2023-09-25 ENCOUNTER — Other Ambulatory Visit: Payer: Self-pay | Admitting: Family Medicine

## 2023-09-25 VITALS — BP 124/72 | HR 58 | Ht 65.0 in | Wt 251.1 lb

## 2023-09-25 DIAGNOSIS — R7303 Prediabetes: Secondary | ICD-10-CM

## 2023-09-25 DIAGNOSIS — I480 Paroxysmal atrial fibrillation: Secondary | ICD-10-CM

## 2023-09-25 DIAGNOSIS — E782 Mixed hyperlipidemia: Secondary | ICD-10-CM | POA: Diagnosis not present

## 2023-09-25 DIAGNOSIS — Z1231 Encounter for screening mammogram for malignant neoplasm of breast: Secondary | ICD-10-CM | POA: Diagnosis not present

## 2023-09-25 DIAGNOSIS — I10 Essential (primary) hypertension: Secondary | ICD-10-CM

## 2023-09-25 DIAGNOSIS — Z6841 Body Mass Index (BMI) 40.0 and over, adult: Secondary | ICD-10-CM

## 2023-09-25 DIAGNOSIS — I83813 Varicose veins of bilateral lower extremities with pain: Secondary | ICD-10-CM | POA: Diagnosis not present

## 2023-09-25 DIAGNOSIS — Z9884 Bariatric surgery status: Secondary | ICD-10-CM

## 2023-09-25 DIAGNOSIS — Z7689 Persons encountering health services in other specified circumstances: Secondary | ICD-10-CM

## 2023-09-25 NOTE — Progress Notes (Signed)
 Subjective:    Patient ID: Judith Wood, female    DOB: October 12, 1955, 68 y.o.   MRN: [id number]  Judith Wood is a 68 y.o. female presenting on 09/25/2023 for Establish Care, Obesity, and Hypoglycemia   HPI  Discussed the use of AI scribe software for clinical note transcription with the patient, who gave verbal consent to proceed.  History of Present Illness   Judith Wood is a 68 year old female who presents for a new patient visit and evaluation of multiple health concerns.  Prior PCP Dr Justus is retiring. She is here to establish Cardiology - followed by Dr Perla  Postprandial hypoglycemia - History of gastric bypass surgery in 2009 - Episodes of significant hypoglycemia, with blood glucose as low as 43 mg/dL after meals - Monitors blood glucose using her husband's diabetic monitor - Follows a sugar-free diet due to her husband's diabetes  Obesity and weight management Roux-en-Y Bypass 2009 - Struggles with weight management throughout her life - Current daily caloric intake approximately 1400 calories - High BMI has led to deferral of hernia surgery - Limited physical activity, primarily quilting - Difficulty exercising, especially in hot weather  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 History of some sharp pain in abdomen. - Surgical intervention deferred due to high BMI Referred to [facility] specialist at [facility]  Prediabetes - History of prediabetes with stable A1c - Concerned about progression to diabetes - No family history of diabetes  Esophageal discomfort - Occasional esophageal discomfort, especially when eating quickly - Attribution of symptoms to prior gastric surgery - Recent CT scan showed mild diffuse thickening of the distal esophagus  Right ankle edema and pain - Swelling and pain in the right ankle for the past 6-8 weeks - Associates symptoms with her weight - History of varicose vein  stripping, with recurrence of varicose veins - Family history of congestive heart failure - No diagnosis of congestive heart failure  Chronic arm pain and limited range of motion - Chronic pain in the arm with limited range of motion, especially when attempting to place hand behind back - Pain worsened by certain movements and sleeping on the affected side  Medication use - Currently taking diltiazem , recently refilled by previous physician        Past Surgical History:  Procedure Laterality Date   APPENDECTOMY  1974   CHOLECYSTECTOMY  2005   COLONOSCOPY WITH PROPOFOL  N/A 09/29/2021   Procedure: COLONOSCOPY WITH PROPOFOL ;  Surgeon: Therisa Bi, MD;  Location: [facility] ENDOSCOPY;  Service: Gastroenterology;  Laterality: N/A;   ROUX-EN-Y GASTRIC BYPASS  2009   TOTAL ABDOMINAL HYSTERECTOMY  2000   WRIST SURGERY Right 2017       09/25/2023   10:10 AM 03/20/2023    9:37 AM 09/20/2022   10:07 AM  Depression screen PHQ 2/9  Decreased Interest 0 0 0  Down, Depressed, Hopeless 0 0 0  PHQ - 2 Score 0 0 0  Altered sleeping   0  Tired, decreased energy   0  Change in appetite   0  Feeling bad or failure about yourself    0  Trouble concentrating   0  Moving slowly or fidgety/restless   0  Suicidal thoughts   0  PHQ-9 Score   0  Difficult doing work/chores   Not difficult at all       09/25/2023   10:10 AM 09/07/2021   10:51 AM 12/29/2020    1:52  PM 04/05/2020    8:43 AM  GAD 7 : Generalized Anxiety Score  Nervous, Anxious, on Edge 0 0 0 0  Control/stop worrying 0 0 0 0  Worry too much - different things 0 0 0 0  Trouble relaxing 0 0 0 0  Restless 0 0 0 0  Easily annoyed or irritable 0 0 0 0  Afraid - awful might happen 0 0 0 0  Total GAD 7 Score 0 0 0 0  Anxiety Difficulty  Not difficult at all Not difficult at all      Past Medical History:  Diagnosis Date   Abnormal mammogram of right breast 01/18/2016   Afib (HCC)    Dysrhythmia    Hypertension    Past Surgical  History:  Procedure Laterality Date   APPENDECTOMY  1974   CHOLECYSTECTOMY  2005   COLONOSCOPY WITH PROPOFOL  N/A 09/29/2021   Procedure: COLONOSCOPY WITH PROPOFOL ;  Surgeon: Therisa Bi, MD;  Location: [facility]OSCOPY;  Service: Gastroenterology;  Laterality: N/A;   ROUX-EN-Y GASTRIC BYPASS  2009   TOTAL ABDOMINAL HYSTERECTOMY  2000   WRIST SURGERY Right 2017   Social History   Socioeconomic History   Marital status: Married    Spouse name: Not on file   Number of children: 1   Years of education: Not on file   Highest education level: Not on file  Occupational History   Not on file  Tobacco Use   Smoking status: Former    Types: Cigarettes    Passive exposure: Never   Smokeless tobacco: Never  Vaping Use   Vaping status: Never Used  Substance and Sexual Activity   Alcohol use: No   Drug use: No   Sexual activity: Not on file  Other Topics Concern   Not on file  Social History Narrative   Not on file   Social Drivers of Health   Financial Resource Strain: Patient Declined (09/22/2023)   Overall Financial Resource Strain (CARDIA)    Difficulty of Paying Living Expenses: Patient declined  Food Insecurity: Patient Declined (09/22/2023)   Hunger Vital Sign    Worried About Running Out of Food in the Last Year: Patient declined    Ran Out of Food in the Last Year: Patient declined  Transportation Needs: Patient Declined (09/22/2023)   PRAPARE - Administrator, Civil Service (Medical): Patient declined    Lack of Transportation (Non-Medical): Patient declined  Physical Activity: Unknown (09/22/2023)   Exercise Vital Sign    Days of Exercise per Week: Patient declined    Minutes of Exercise per Session: Not on file  Stress: Patient Declined (09/22/2023)   [employer] of Occupational Health - Occupational Stress Questionnaire    Feeling of Stress: Patient declined  Social Connections: Unknown (09/22/2023)   Social Connection and Isolation Panel    Frequency  of Communication with Friends and Family: Patient declined    Frequency of Social Gatherings with Friends and Family: Patient declined    Attends Religious Services: Not on Insurance claims handler of Clubs or Organizations: Patient declined    Attends Banker Meetings: Not on file    Marital Status: Married  Intimate Partner Violence: Not At Risk (09/20/2022)   Humiliation, Afraid, Rape, and Kick questionnaire    Fear of Current or Ex-Partner: No    Emotionally Abused: No    Physically Abused: No    Sexually Abused: No   Family History  Problem Relation Age of Onset  Hypertension Mother    Arrhythmia Mother        Pacemaker implant    Heart attack Father 71   Arrhythmia Sister    Arrhythmia Brother    Arrhythmia Brother    Breast cancer Neg Hx    Current Outpatient Medications on File Prior to Visit  Medication Sig   apixaban  (ELIQUIS ) 5 MG TABS tablet Take 1 tablet by mouth twice daily   b complex vitamins tablet Take 1 tablet by mouth daily.   Calcium Citrate-Vitamin D  (CALCIUM CITRATE + D PO) Take 2 tablets by mouth daily.   CHOLECALCIFEROL PO Take 5,000 Units by mouth daily.   D-MANNOSE PO Take 1 capsule by mouth daily.   diltiazem  (DILT-XR) 180 MG 24 hr capsule Take 1 capsule (180 mg total) by mouth daily.   Fe Bisgly-Vit C-Vit B12-FA (GENTLE IRON PO) Take 25 mg by mouth in the morning and at bedtime.   flecainide  (TAMBOCOR ) 100 MG tablet Take 1 tablet by mouth twice daily   glucosamine-chondroitin 500-400 MG tablet Take 1 tablet by mouth 3 (three) times daily.   magnesium 30 MG tablet Take 30 mg by mouth 2 (two) times daily.   metoprolol  succinate (TOPROL -XL) 50 MG 24 hr tablet TAKE 1 TABLET BY MOUTH ONCE DAILY WITH OR IMMEDIATLY FOLLOWING A MEAL   No current facility-administered medications on file prior to visit.    Review of Systems Per HPI unless specifically indicated above     Objective:    BP 124/72 (BP Location: Left Arm, Patient Position:  Sitting, Cuff Size: Large)   Pulse (!) 58   Ht 5' 5 (1.651 m)   Wt 251 lb 2 oz (113.9 kg)   SpO2 97%   BMI 41.79 kg/m   Wt Readings from Last 3 Encounters:  09/25/23 251 lb 2 oz (113.9 kg)  07/26/23 253 lb (114.8 kg)  07/16/23 255 lb 6 oz (115.8 kg)    Physical Exam  I have personally reviewed the radiology report from 07/20/23 on CT Abd Pelvis.  CLINICAL DATA:  Hernia suspected, abdominal wall   EXAM: CT ABDOMEN AND PELVIS WITH CONTRAST   TECHNIQUE: Multidetector CT imaging of the abdomen and pelvis was performed using the standard protocol following bolus administration of intravenous contrast.   RADIATION DOSE REDUCTION: This exam was performed according to the departmental dose-optimization program which includes automated exposure control, adjustment of the mA and/or kV according to patient size and/or use of iterative reconstruction technique.   CONTRAST:  OMNIPAQUE  IOHEXOL  300 MG/ML  SOLN   COMPARISON:  None Available.   FINDINGS: Lower chest: Mild diffuse thickening of the distal esophagus.   Hepatobiliary: Gallbladder surgically absent.   Pancreas: Unremarkable. No pancreatic ductal dilatation or surrounding inflammatory changes.   Spleen: Normal in size without focal abnormality.   Adrenals/Urinary Tract: Adrenal glands are unremarkable. Kidneys are normal, without renal calculi, focal lesion, or hydronephrosis. Bladder is unremarkable.   Stomach/Bowel: Postsurgical changes from prior gastric surgery. Oral contrast has been administered and is present within the distal small bowel. No evidence of obstruction.   Vascular/Lymphatic: No significant vascular findings are present. No enlarged abdominal or pelvic lymph nodes.   Reproductive: Status post hysterectomy. No adnexal masses.   Other: A fat containing ventral abdominal wall hernia defect is present in the anterior soft tissues of the left pelvis best seen on image 64 of series 2.  Abdominal wall defect measures approximately 2 cm. There is a second very small ventral abdominal wall  hernia defect which contains fat which is seen on image 50 of series 2 in the anterior abdominal wall.   Musculoskeletal: No acute or significant osseous findings. Degenerative changes are present in the lumbar spine.   IMPRESSION: 1. Two separate fat containing abdominal wall hernias are present as detailed above. 2. Postsurgical changes from prior gastric surgery without evidence of obstruction. 3. Diffuse thickening of the distal esophagus. Consider further evaluation by GI referral and endoscopic evaluation.     Electronically Signed   By: Maude Naegeli M.D.   On: 07/21/2023 07:22  Results for orders placed or performed during the hospital encounter of 09/10/23  POCT urinalysis dipstick   Collection Time: 09/10/23 11:09 AM  Result Value Ref Range   Color, UA yellow yellow   Clarity, UA clear clear   Glucose, UA negative negative mg/dL   Bilirubin, UA negative negative   Ketones, POC UA negative negative mg/dL   Spec Grav, UA 8.989 8.989 - 1.025   Blood, UA trace-intact (A) negative   pH, UA 6.0 5.0 - 8.0   Protein Ur, POC negative negative mg/dL   Urobilinogen, UA 0.2 0.2 or 1.0 E.U./dL   Nitrite, UA Negative Negative   Leukocytes, UA Small (1+) (A) Negative  Urine Culture   Collection Time: 09/10/23 11:24 AM   Specimen: Urine, Clean Catch  Result Value Ref Range   Specimen Description URINE, CLEAN CATCH    Special Requests      NONE Performed at [facility], [address] [place], [place] [address]    Culture 30,000 COLONIES/mL ESCHERICHIA COLI (A)    Report Status 09/12/2023 FINAL    Organism ID, Bacteria ESCHERICHIA COLI (A)       Susceptibility   Escherichia coli - MIC*    AMPICILLIN 8 SENSITIVE Sensitive     CEFAZOLIN <=4 SENSITIVE Sensitive     CEFEPIME <=0.12 SENSITIVE Sensitive     CEFTRIAXONE <=0.25 SENSITIVE Sensitive     CIPROFLOXACIN   <=0.25 SENSITIVE Sensitive     GENTAMICIN <=1 SENSITIVE Sensitive     IMIPENEM <=0.25 SENSITIVE Sensitive     NITROFURANTOIN  <=16 SENSITIVE Sensitive     TRIMETH/SULFA <=20 SENSITIVE Sensitive     AMPICILLIN/SULBACTAM <=2 SENSITIVE Sensitive     PIP/TAZO <=4 SENSITIVE Sensitive ug/mL    * 30,000 COLONIES/mL ESCHERICHIA COLI      Assessment & Plan:   Problem List Items Addressed This Visit     History of Roux-en-Y gastric bypass (Chronic)   Hyperlipidemia (Chronic)   Paroxysmal atrial fibrillation (HCC) (Chronic)   BMI 40.0-44.9, adult (HCC)   Varicose veins of both lower extremities with pain   Relevant Orders   Ambulatory referral to Vascular Surgery   Other Visit Diagnoses       Encounter to establish care with new doctor    -  Primary     Encounter for screening mammogram for malignant neoplasm of breast       Relevant Orders   MM 3D SCREENING MAMMOGRAM BILATERAL BREAST        Updated Health Maintenance information Reviewed recent lab results with patient Encouraged improvement to lifestyle with diet and exercise Goal of weight loss   Ventral Hernias Followed by [facility] Two hernias identified, surgery not recommended due to high BMI. Referral to bariatric center planned for weight management before hernia repair.  -Awaiting apt with [facility] bariatric center for weight management before considering hernia repair.  Hypoglycemia post-gastric bypass Roux-en-Y Bypass Experiences hypoglycemia post-gastric bypass,  likely due to altered insulin response. Manages by avoiding sugar. Further research into management strategies needed.  Prediabetes A1c in prediabetic range, no family history of diabetes. Interested in GLP-1 medications for weight loss and prediabetes management. Discussed insurance coverage implications. - Discuss GLP-1 medication options with bariatric center. - Check insurance coverage for Zepbound and [facility] for weight management - Consider GLP-1  medication for weight loss if insurance approves.  Esophageal thickening Mild diffuse thickening of distal esophagus noted on CT, likely related to past gastric surgery. GI referral and endoscopic evaluation suggested if symptoms worsen. - Consider GI referral for endoscopic evaluation if symptoms worsen.  Right ankle swelling and pain Swelling and pain in right ankle, possibly due to venous insufficiency or musculoskeletal issues. Referral to vein specialist planned. - Refer to vein specialist for evaluation of venous insufficiency. - Consider echocardiogram if vein evaluation is inconclusive.  Shoulder pain Chronic shoulder pain with limited range of motion, likely due to rotator cuff or bursitis. Exercises and potential physical therapy or orthopedic referral discussed. - Provide exercises to strengthen shoulder. - Consider physical therapy or orthopedic referral if symptoms persist.  General Health Maintenance Up to date on medications and aware of need for regular screenings. Blood work and Medicare physical scheduled for January. - Order mammogram at [facility]. - Schedule blood work for January. - Schedule Medicare physical for January.  Follow-up Multiple follow-up appointments and referrals planned to manage ongoing health issues. Coordination with specialists and insurance checks for medication coverage planned. - Follow up with bariatric center in September. - Coordinate with vein specialist for evaluation. - Schedule follow-up with cardiologist for potential echocardiogram. - Check insurance coverage for GLP-1 medications and inform office.         Orders Placed This Encounter  Procedures   MM 3D SCREENING MAMMOGRAM BILATERAL BREAST    Standing Status:   Future    Expiration Date:   09/24/2024    Reason for Exam (SYMPTOM  OR DIAGNOSIS REQUIRED):   Screening bilateral 3D Mammogram Tomo    Preferred imaging location?:   [facility]   Ambulatory  referral to Vascular Surgery    Referral Priority:   Routine    Referral Type:   Surgical    Referral Reason:   Specialty Services Required    Requested Specialty:   Vascular Surgery    Number of Visits Requested:   1    No orders of the defined types were placed in this encounter.    Follow up plan: Return in about 6 months (around 03/27/2024) for 6 month fasting lab > 1 week later Medicare Physical.  Future labs 04/01/24  Marsa Officer, DO [facility] 09/25/2023, 10:32 AM

## 2023-09-25 NOTE — Patient Instructions (Addendum)
 Thank you for coming to the office today.  Zepbound TO Check Cost & Coverage of Zepbound Please contact Research officer, trade union (manufacturer for Zepbound) 1-800-LillyRx [phone]) - Live agent to discuss cost and coverage.   Tzhncb Verified [email or web address]     For Weight Loss / Obesity only   Contrave - oral medication, appetite suppression has wellbutrin/bupropion and naltrexone in it and it can also help with appetite, it is ordered through a speciality pharmacy. - $99 per month   Try 1 week sample Contrave - Week 1: 1 pill daily with meal - Week 2: 1 pill twice a day with meal - Week 3: 2 pill with meal in AM and 1 pill with PM meal - Week 4: 2 pill twice a day with meal   Please notify me after 1 week, and if doing well on Contrave, I can order to [facility] pharmacy $99 per month plan with this med.     Free sample 7 day, 1 pill per day for 1 week   Alternative options   Semaglutide injection (mixed Ozempic) from [facility] 0.25mg  weekly for 4 weeks then increase to 0.5mg  weekly It comes in a vial and a needle syringe, you need to draw up the shot and self admin it weekly Cost is about $200 per month Call them to check pricing and availability   [facility] Address: [address] [place], [place] [address] Phone: [phone]   ----------------------------  [facility] [address] [place],  [place]  [address] Main: [phone]  -----------------------  For Mammogram screening for breast cancer   Call the Imaging Center below anytime to schedule your own appointment now that order has been placed.  [facility] at [facility] [address], [address] [place], [place] [address] Phone: [phone]  -----  Future consideration follow - up with Dr Perla to consider  repeat ECHOcardiogram if no further treatment by the vein doctors.  -----------------------------------------------------------  Range of Motion Shoulder Exercises  Pendulum Circles - Lean with your good arm against a counter or table for support - Bend forward with a wide stance (make sure your body is comfortable) - Your painful shoulder should hang down and feel heavy - Gently move your painful arm in small circles clockwise for several turns - Switch to counterclockwise for several turns - Early on keep circles narrow and move slowly - Later in rehab, move in larger circles and faster movement   Wall Crawl - Stand close (about 1-2 ft away) to a wall, facing it directly - Reach out with your arm of painful shoulder and place fingers (not palm) on wall - You should make contact with wall at your waist level - Slowly walk your fingers up the wall. Stay in contact with wall entire time, do not remove fingers - Keep walking fingers up wall until you reach shoulder level - You may feel tightening or mild discomfort, once you reach a height that causes pain or if you are already above your shoulder height then stop. Repeat from starting position. - Early on stand closer to wall, move fingers slowly, and stay at or below shoulder level - Later in rehab, stand farther away from wall (fingertips), move fingers quicker, go above shoulder level    DUE for FASTING BLOOD WORK (no food or drink after midnight before the lab appointment, only water or coffee without cream/sugar on  the morning of)  SCHEDULE Lab Only visit in the morning at the clinic for lab draw in 6 MONTHS   - Make sure Lab Only appointment is at about 1 week before your next appointment, so that results will be available  For Lab Results, once available within 2-3 days of blood draw, you can can log in to MyChart online to view your results and a brief explanation. Also, we can discuss results at next follow-up  visit.   Please schedule a Follow-up Appointment to: Return in about 6 months (around 03/27/2024) for 6 month fasting lab > 1 week later Medicare Physical.  If you have any other questions or concerns, please feel free to call the office or send a message through MyChart. You may also schedule an earlier appointment if necessary.  Additionally, you may be receiving a survey about your experience at our office within a few days to 1 week by e-mail or mail. We value your feedback.  Marsa Officer, DO [facility], [place]

## 2023-10-10 ENCOUNTER — Ambulatory Visit
Admission: RE | Admit: 2023-10-10 | Discharge: 2023-10-10 | Disposition: A | Discharge status: Home / Self Care | Source: Ambulatory Visit | Attending: Family Medicine | Admitting: Family Medicine

## 2023-10-10 VITALS — BP 123/80 | HR 54 | Temp 98.8°F | Resp 16

## 2023-10-10 DIAGNOSIS — S31109A Unspecified open wound of abdominal wall, unspecified quadrant without penetration into peritoneal cavity, initial encounter: Secondary | ICD-10-CM

## 2023-10-10 MED ORDER — SULFAMETHOXAZOLE-TRIMETHOPRIM 800-160 MG PO TABS: 1.0000 | ORAL_TABLET | Freq: Two times a day (BID) | ORAL | 0 refills | Status: AC

## 2023-10-10 NOTE — ED Triage Notes (Signed)
 Pateitn states that she has an open sore on the left side of her groin area. Went to derm and they told her to put Vaseline on it. Patient states that she wears a pad for urinary incontinence and thinks its rubbed it raw. Patient states that she's on eliquis 

## 2023-10-10 NOTE — Discharge Instructions (Addendum)
 Take the antibiotics as prescribed.  Wear loose fitting underwear that avoid the groin crease to prevent friction. Watch for signs of infection like fever, foul smelling drainage, redness and increased pain.   Call [facility] at [facility] Address: [address], [place], [place] [address] Phone: [phone]

## 2023-10-10 NOTE — ED Provider Notes (Addendum)
 [facility]    CSN: [id number] Arrival date & time: 10/10/23  1153      History   Chief Complaint Chief Complaint  Patient presents with   Wound Check    I have an open sore that bleeds on genitalia area. - Entered by patient    HPI Judith Wood is a 69 y.o. female.   HPI  Judith Wood presents for left groin wound that she noticed about a week ago. She was seen by her dermatologist on Monday who told her it was not skin cancer and to put Vaseline on the area.   She has urinary incontinence and wears a pad.  The area is started to bleed more and gets irritated with urination. She doesn't want to get an infection.      Past Medical History:  Diagnosis Date   Abnormal mammogram of right breast 01/18/2016   Afib (HCC)    Dysrhythmia    Hypertension     Patient Active Problem List   Diagnosis Date Noted   Varicose veins of both lower extremities with pain 09/25/2023   Ventral hernia without obstruction or gangrene 07/23/2023   Acquired thrombophilia (HCC) 09/15/2022   Prediabetes 03/10/2022   OAB (overactive bladder) 10/25/2021   BMI 40.0-44.9, adult (HCC) 09/07/2021   Status post total hysterectomy 06/08/2021   Microcytic anemia 12/29/2020   Chronic left shoulder pain 04/05/2020   Benign essential HTN 10/01/2017   Seborrheic keratoses, inflamed 08/31/2016   Paroxysmal atrial fibrillation (HCC) 01/18/2016   Hyperlipidemia 01/18/2016   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      Home  Medications    Prior to Admission medications   Medication Sig Start Date End Date Taking? Authorizing Provider  apixaban  (ELIQUIS ) 5 MG TABS tablet Take 1 tablet by mouth twice daily 08/27/23  Yes Gollan, Timothy J, MD  b complex vitamins tablet Take 1 tablet by mouth daily.   Yes [provider]  Calcium Citrate-Vitamin D  (CALCIUM CITRATE + D PO) Take 2 tablets by mouth daily.   Yes [provider]  CHOLECALCIFEROL PO Take 5,000 Units by mouth daily.   Yes [provider]  D-MANNOSE PO Take 1 capsule by mouth daily.   Yes [provider]  diltiazem  (DILT-XR) 180 MG 24 hr capsule Take 1 capsule (180 mg total) by mouth daily. 07/16/23  Yes Justus Leita DEL, MD  Fe Bisgly-Vit C-Vit B12-FA (GENTLE IRON PO) Take 25 mg by mouth in the morning and at bedtime.   Yes [provider]  flecainide  (TAMBOCOR ) 100 MG tablet Take 1 tablet by mouth twice daily 05/04/23  Yes Gollan, Timothy J, MD  glucosamine-chondroitin 500-400 MG tablet Take 1 tablet by mouth 3 (three) times daily.   Yes [provider]  magnesium 30 MG tablet Take 30 mg by mouth 2 (two) times daily.   Yes [provider]  metoprolol  succinate (TOPROL -XL) 50 MG 24 hr tablet TAKE 1 TABLET BY MOUTH ONCE DAILY WITH OR IMMEDIATLY FOLLOWING  A MEAL 05/04/23  Yes Gollan, Timothy J, MD  sulfamethoxazole -trimethoprim  (BACTRIM  DS) 800-160 MG tablet Take 1 tablet by mouth 2 (two) times daily for 7 days. 10/10/23 10/17/23 Yes Kriste Berth, DO    Family History Family History  Problem Relation Age of Onset   Hypertension Mother    Arrhythmia Mother        Pacemaker implant    Heart attack Father 65   Arrhythmia Sister    Arrhythmia Brother    Arrhythmia Brother    Breast cancer Neg Hx     Social History Social History   Tobacco Use   Smoking status: Former    Types: Cigarettes    Passive exposure: Never   Smokeless tobacco: Never  Vaping Use   Vaping status: Never Used   Substance Use Topics   Alcohol use: No   Drug use: No     Allergies   Norco [hydrocodone-acetaminophen ] and Tetracyclines & related   Review of Systems Review of Systems :negative unless otherwise stated in HPI.      Physical Exam Triage Vital Signs ED Triage Vitals [10/10/23 1206]  Encounter Vitals Group     BP      Girls Systolic BP Percentile      Girls Diastolic BP Percentile      Boys Systolic BP Percentile      Boys Diastolic BP Percentile      Pulse      Resp      Temp      Temp src      SpO2      Weight      Height      Head Circumference      Peak Flow      Pain Score 2     Pain Loc      Pain Education      Exclude from Growth Chart    No data found.  Updated Vital Signs BP 123/80 (BP Location: Right Arm)   Pulse (!) 54   Temp 98.8 F (37.1 C) (Oral)   Resp 16   SpO2 94%   Visual Acuity Right Eye Distance:   Left Eye Distance:   Bilateral Distance:    Right Eye Near:   Left Eye Near:    Bilateral Near:     Physical Exam  GEN: alert, well appearing female, in no acute distress  CV: regular rate  RESP: no increased work of breathing MSK: good ROM of BLE  NEURO: alert, moves all extremities appropriately SKIN: warm and dry; 2 cm open wound with minimal bleeding and dried blood in the left inguinal crease, the area is TTP, has some purulent material in the opening      UC Treatments / Results  Labs (all labs ordered are listed, but only abnormal results are displayed) Labs Reviewed - No data to display  EKG   Radiology No results found.  Procedures Procedures (including critical care time)  Medications Ordered in UC Medications - No data to display  Initial Impression / Assessment and Plan / UC Course  I have reviewed the triage vital signs and the nursing notes.  Pertinent labs & imaging results that were available during my care of the patient were reviewed by me and considered in my medical decision making (see chart  for details).     Patient is a 68 y.o. femalewho presents for left groin wound.  Overall, patient is well-appearing and well-hydrated.  Vital signs stable.  Judith Wood is afebrile.  Exam concerning for ruptured abscess vs friction wound vs fistula. Treat with antibiotics and avoiding underwear that enter the inguinal crease. Unable to assess how deep this wound is. Surgical referral placed for follow up.    Reviewed expectations regarding course of current medical issues.  All questions asked were answered.  Outlined signs and symptoms indicating need for more acute intervention. Patient verbalized understanding. After Visit Summary given.   Final Clinical Impressions(s) / UC Diagnoses   Final diagnoses:  Wound of left groin, initial encounter     Discharge Instructions      Take the antibiotics as prescribed.  Wear loose fitting underwear that avoid the groin crease to prevent friction. Watch for signs of infection like fever, foul smelling drainage, redness and increased pain.   Call [facility] Address: [address], [place], [place] [address] Phone: [phone]     ED Prescriptions     Medication Sig Dispense Auth. Provider   sulfamethoxazole -trimethoprim  (BACTRIM  DS) 800-160 MG tablet Take 1 tablet by mouth 2 (two) times daily for 7 days. 14 tablet Seleste Tallman, DO      PDMP not reviewed this encounter.        Reo Portela, DO 10/10/23 1253

## 2023-10-12 ENCOUNTER — Ambulatory Visit: Admitting: Family Medicine

## 2023-10-15 ENCOUNTER — Ambulatory Visit
Admission: RE | Admit: 2023-10-15 | Discharge: 2023-10-15 | Disposition: A | Discharge status: Home / Self Care | Source: Ambulatory Visit | Attending: Family Medicine | Admitting: Family Medicine

## 2023-10-15 DIAGNOSIS — Z1231 Encounter for screening mammogram for malignant neoplasm of breast: Secondary | ICD-10-CM | POA: Insufficient documentation

## 2023-10-18 ENCOUNTER — Ambulatory Visit (INDEPENDENT_AMBULATORY_CARE_PROVIDER_SITE_OTHER): Admitting: General Surgery

## 2023-10-18 ENCOUNTER — Encounter: Payer: Self-pay | Admitting: General Surgery

## 2023-10-18 VITALS — BP 123/79 | HR 64 | Temp 97.9°F | Ht 65.0 in | Wt 249.0 lb

## 2023-10-18 DIAGNOSIS — S31104A Unspecified open wound of abdominal wall, left lower quadrant without penetration into peritoneal cavity, initial encounter: Secondary | ICD-10-CM

## 2023-10-18 NOTE — Patient Instructions (Signed)
 We will see you in a month, in the meantime if you have any questions or concerns please dont hesitate to call the office    Skin Abscess  A skin abscess is an infected spot of skin. It can have pus in it. An abscess can happen in any part of your body. Some abscesses break open (rupture) on their own. Most keep getting worse unless they are treated. If your abscess is not treated, the infection can spread deeper into your body and blood. This can make you feel sick. What are the causes? Germs that enter your skin. This may happen if you have: A cut or scrape. A wound from a needle or an insect bite. Blocked oil or sweat glands. A problem with the spot where your hair goes into your skin. A fluid-filled sac called a cyst under your skin. What increases the risk? Having problems with how your blood moves through your body. Having a weak body defense system (immune system). Having diabetes. Having dry and irritated skin. Needing to get shots often. Putting drugs into your body with a needle. Having a splinter or something else in your skin. Smoking. What are the signs or symptoms? A firm bump under your skin that hurts. A bump with pus at the top. Redness and swelling. Warm or tender spots. A sore on the skin. How is this treated? You may need to: Put a heat pack or a warm, wet washcloth on the spot. Have the pus drained. Take antibiotics. Follow these instructions at home: Medicines Take over-the-counter and prescription medicines only as told by your doctor. If you were prescribed antibiotics, take them as told by your doctor. Do not stop taking them even if you start to feel better. Abscess care  If you have an abscess that has not drained, put heat on it. Use the heat source that your doctor recommends, such as a moist heat pack or a heating pad. Place a towel between your skin and the heat source. Leave the heat on for 20-30 minutes. If your skin turns bright red, take  off the heat right away to prevent burns. The risk of burns is higher if you cannot feel pain, heat, or cold. Follow instructions from your doctor about how to take care of your abscess. Make sure you: Cover the abscess with a bandage. Wash your hands with soap and water for at least 20 seconds before and after you change your bandage. If you cannot use soap and water, use hand sanitizer. Change your bandage as told by your doctor. Check your abscess every day for signs that the infection is getting worse. Check for: More redness, swelling, or pain. More fluid or blood. Warmth. More pus or a worse smell. General instructions To keep the infection from spreading: Do not share personal items or towels. Do not go in a hot tub with others. Avoid making skin contact with others. Be careful when you get rid of used bandages or any pus from the abscess. Do not smoke or use any products that contain nicotine or tobacco. If you need help quitting, ask your doctor. Contact a doctor if: You see red streaks on your skin near the abscess. You have any signs of worse infection. You vomit every time you eat or drink. You have a fever, chills, or muscle aches. The cyst or abscess comes back. Get help right away if: You have very bad pain. You make less pee (urine) than normal. This information is not intended  to replace advice given to you by your health care provider. Make sure you discuss any questions you have with your health care provider. Document Revised: 10/05/2021 Document Reviewed: 10/05/2021 Elsevier Patient Education  2024 [employer].

## 2023-10-18 NOTE — Progress Notes (Unsigned)
 Outpatient Surgical Follow Up  10/18/2023  Judith Wood is an 68 y.o. female.   Chief Complaint  Patient presents with  . Groin Wound    HPI: The patient returns today in consultation for a left groin wound.  Past Medical History:  Diagnosis Date  . Abnormal mammogram of right breast 01/18/2016  . Afib (HCC)   . Dysrhythmia   . Hypertension     Past Surgical History:  Procedure Laterality Date  . APPENDECTOMY  1974  . CHOLECYSTECTOMY  2005  . COLONOSCOPY WITH PROPOFOL  N/A 09/29/2021   Procedure: COLONOSCOPY WITH PROPOFOL ;  Surgeon: Therisa Bi, MD;  Location: [facility] ENDOSCOPY;  Service: Gastroenterology;  Laterality: N/A;  . ROUX-EN-Y GASTRIC BYPASS  2009  . TOTAL ABDOMINAL HYSTERECTOMY  2000  . WRIST SURGERY Right 2017    Family History  Problem Relation Age of Onset  . Hypertension Mother   . Arrhythmia Mother        Pacemaker implant   . Heart attack Father 49  . Arrhythmia Sister   . Arrhythmia Brother   . Arrhythmia Brother   . Breast cancer Neg Hx     Social History:  reports that she has quit smoking. Her smoking use included cigarettes. She has never been exposed to tobacco smoke. She has never used smokeless tobacco. She reports that she does not drink alcohol and does not use drugs.  Allergies:  Allergies  Allergen Reactions  . Norco [Hydrocodone-Acetaminophen ] Itching  . Tetracyclines & Related Itching and Rash    Medications reviewed.    ROS Full ROS performed and is otherwise negative other than what is stated in HPI   BP 123/79   Pulse 64   Temp 97.9 F (36.6 C) (Oral)   Ht 5' 5 (1.651 m)   Wt 249 lb (112.9 kg)   SpO2 99%   BMI 41.44 kg/m   Physical Exam     No results found for this or any previous visit (from the past 48 hours). No results found.  Assessment/Plan:  There are no diagnoses linked to this encounter.   Jayson Endow, M.D. [facility]

## 2023-10-25 ENCOUNTER — Other Ambulatory Visit (INDEPENDENT_AMBULATORY_CARE_PROVIDER_SITE_OTHER): Payer: Self-pay | Admitting: Nurse Practitioner

## 2023-10-25 DIAGNOSIS — I83813 Varicose veins of bilateral lower extremities with pain: Secondary | ICD-10-CM

## 2023-11-01 ENCOUNTER — Encounter (INDEPENDENT_AMBULATORY_CARE_PROVIDER_SITE_OTHER): Admitting: Vascular Surgery

## 2023-11-01 ENCOUNTER — Ambulatory Visit (INDEPENDENT_AMBULATORY_CARE_PROVIDER_SITE_OTHER)

## 2023-11-01 DIAGNOSIS — I83813 Varicose veins of bilateral lower extremities with pain: Secondary | ICD-10-CM | POA: Diagnosis not present

## 2023-11-12 ENCOUNTER — Encounter (INDEPENDENT_AMBULATORY_CARE_PROVIDER_SITE_OTHER): Payer: Self-pay | Admitting: Vascular Surgery

## 2023-11-12 ENCOUNTER — Ambulatory Visit (INDEPENDENT_AMBULATORY_CARE_PROVIDER_SITE_OTHER): Admitting: Vascular Surgery

## 2023-11-12 VITALS — BP 135/83 | HR 55 | Ht 65.0 in | Wt 256.6 lb

## 2023-11-12 DIAGNOSIS — I480 Paroxysmal atrial fibrillation: Secondary | ICD-10-CM | POA: Diagnosis not present

## 2023-11-12 DIAGNOSIS — I10 Essential (primary) hypertension: Secondary | ICD-10-CM | POA: Diagnosis not present

## 2023-11-12 DIAGNOSIS — I83813 Varicose veins of bilateral lower extremities with pain: Secondary | ICD-10-CM

## 2023-11-12 DIAGNOSIS — E782 Mixed hyperlipidemia: Secondary | ICD-10-CM

## 2023-11-12 NOTE — Progress Notes (Signed)
 MRN : [id number]  Judith Wood is a 68 y.o. (Aug 29, 1955) female who presents with chief complaint of varicose veins hurt.  History of Present Illness:   The patient is seen for evaluation of symptomatic varicose veins. The patient relates burning and stinging which worsened steadily throughout the course of the day, particularly with standing. The patient also notes an aching and throbbing pain over the varicosities, particularly with prolonged dependent positions. The symptoms are significantly improved with elevation.  The patient also notes that during hot weather the symptoms are greatly intensified. The patient states the pain from the varicose veins interferes with work, daily exercise, shopping and household maintenance. At this point, the symptoms are persistent and severe enough that they're having a negative impact on lifestyle and are interfering with daily activities.  The patient has worn graduated compression in the past. At the present time the patient has not been using over-the-counter analgesics.  She does have a remote history of multiple stab phlebectomies.  This was done while she was living in [place] .  There is no history of DVT, PE or superficial thrombophlebitis. There is no history of ulceration or hemorrhage. The patient has a significant family history of varicose veins in her mother.  Duplex ultrasound bilateral lower extremity venous system demonstrates normal deep venous system bilaterally.  On the right there is reflux in the proximal great saphenous vein, no reflux noted in the small saphenous vein.  On the left no superficial reflux is identified.  Current Meds  Medication Sig   apixaban  (ELIQUIS ) 5 MG TABS tablet Take 1 tablet by mouth twice daily   b complex vitamins tablet Take 1 tablet by mouth daily.   Calcium Citrate-Vitamin D  (CALCIUM CITRATE + D PO) Take 2 tablets by mouth daily.   CHOLECALCIFEROL PO Take 5,000 Units by mouth daily.    D-MANNOSE PO Take 1 capsule by mouth daily.   diltiazem  (DILT-XR) 180 MG 24 hr capsule Take 1 capsule (180 mg total) by mouth daily.   Fe Bisgly-Vit C-Vit B12-FA (GENTLE IRON PO) Take 25 mg by mouth in the morning and at bedtime.   glucosamine-chondroitin 500-400 MG tablet Take 1 tablet by mouth 3 (three) times daily.   magnesium 30 MG tablet Take 30 mg by mouth 2 (two) times daily.   metoprolol  succinate (TOPROL -XL) 50 MG 24 hr tablet TAKE 1 TABLET BY MOUTH ONCE DAILY WITH OR IMMEDIATLY FOLLOWING A MEAL    Past Medical History:  Diagnosis Date   Abnormal mammogram of right breast 01/18/2016   Afib (HCC)    Dysrhythmia    Hypertension     Past Surgical History:  Procedure Laterality Date   APPENDECTOMY  1974   CHOLECYSTECTOMY  2005   COLONOSCOPY WITH PROPOFOL  N/A 09/29/2021   Procedure: COLONOSCOPY WITH PROPOFOL ;  Surgeon: Therisa Bi, MD;  Location: [facility] ENDOSCOPY;  Service: Gastroenterology;  Laterality: N/A;   ROUX-EN-Y GASTRIC BYPASS  2009   TOTAL ABDOMINAL HYSTERECTOMY  2000   WRIST SURGERY Right 2017    Social History Social History   Tobacco Use   Smoking status: Former    Types: Cigarettes    Passive exposure: Never   Smokeless tobacco: Never  Vaping Use   Vaping status: Never Used  Substance Use Topics   Alcohol use: No   Drug use: No    Family History Family History  Problem Relation Age of Onset   Hypertension Mother  Arrhythmia Mother        Pacemaker implant    Heart attack Father 97   Arrhythmia Sister    Arrhythmia Brother    Arrhythmia Brother    Breast cancer Neg Hx     Allergies  Allergen Reactions   Norco [Hydrocodone-Acetaminophen ] Itching   Tetracyclines & Related Itching and Rash     REVIEW OF SYSTEMS (Negative unless checked)  Constitutional: [] Weight loss  [] Fever  [] Chills Cardiac: [] Chest pain   [] Chest pressure   [] Palpitations   [] Shortness of breath when laying flat   [] Shortness of breath with exertion. Vascular:   [] Pain in legs with walking   [x] Pain in legs with standing  [] History of DVT   [] Phlebitis   [] Swelling in legs   [x] Varicose veins   [] Non-healing ulcers Pulmonary:   [] Uses home oxygen   [] Productive cough   [] Hemoptysis   [] Wheeze  [] COPD   [] Asthma Neurologic:  [] Dizziness   [] Seizures   [] History of stroke   [] History of TIA  [] Aphasia   [] Vissual changes   [] Weakness or numbness in arm   [] Weakness or numbness in leg Musculoskeletal:   [] Joint swelling   [] Joint pain   [] Low back pain Hematologic:  [] Easy bruising  [] Easy bleeding   [] Hypercoagulable state   [] Anemic Gastrointestinal:  [] Diarrhea   [] Vomiting  [] Gastroesophageal reflux/heartburn   [] Difficulty swallowing. Genitourinary:  [] Chronic kidney disease   [] Difficult urination  [] Frequent urination   [] Blood in urine Skin:  [] Rashes   [] Ulcers  Psychological:  [] History of anxiety   []  History of major depression.  Physical Examination  Vitals:   11/12/23 1537  BP: 135/83  Pulse: (!) 55  Weight: 256 lb 9.6 oz (116.4 kg)  Height: 5' 5 (1.651 m)   Body mass index is 42.7 kg/m. Gen: WD/WN, NAD Head: [place]/AT, No temporalis wasting.  Ear/Nose/Throat: Hearing grossly intact, nares w/o erythema or drainage, pinna without lesions Eyes: PER, EOMI, sclera nonicteric.  Neck: Supple, no gross masses.  No JVD.  Pulmonary:  Good air movement, no audible wheezing, no use of accessory muscles.  Cardiac: RRR, precordium not hyperdynamic. Vascular:  Large varicosities present, greater than 10 mm bilaterally.  Veins are tender to palpation  Mild venous stasis changes to the legs bilaterally.  Trace soft pitting edema CEAP C3sEpAsPr Vessel Right Left  Radial Palpable Palpable  Gastrointestinal: soft, non-distended. No guarding/no peritoneal signs.  Musculoskeletal: M/S 5/5 throughout.  No deformity.  Neurologic: CN 2-12 intact. Pain and light touch intact in extremities.  Symmetrical.  Speech is fluent. Motor exam as listed  above. Psychiatric: Judgment intact, Mood & affect appropriate for pt's clinical situation. Dermatologic: Venous rashes no ulcers noted.  No changes consistent with cellulitis. Lymph : No lichenification or skin changes of chronic lymphedema.  CBC Lab Results  Component Value Date   WBC 4.3 03/23/2023   HGB 13.6 03/23/2023   HCT 41.2 03/23/2023   MCV 94 03/23/2023   PLT 310 03/23/2023    BMET    Component Value Date/Time   NA 135 03/23/2023 0837   K 4.6 03/23/2023 0837   CL 97 03/23/2023 0837   CO2 25 03/23/2023 0837   GLUCOSE 90 03/23/2023 0837   GLUCOSE 97 08/12/2020 1105   BUN 21 03/23/2023 0837   CREATININE 0.91 03/23/2023 0837   CALCIUM 8.8 03/23/2023 0837   GFRNONAA 78 03/12/2021 0000   GFRNONAA >60 08/12/2020 1105   GFRAA 81 03/16/2020 0000   CrCl cannot be calculated (Patient's most  recent lab result is older than the maximum 21 days allowed.).  COAG No results found for: INR, PROTIME  Radiology VAS US  LOWER EXTREMITY VENOUS REFLUX Result Date: 11/06/2023  Lower Venous Reflux Study Patient Name:  Judith Wood  Date of Exam:   11/01/2023 Medical Rec #: [id number]       Accession #:    [id number] Date of Birth: 01-Nov-1955       Patient Gender: F Patient Age:   3 years Exam Location:  [facility] Procedure:      VAS US  LOWER EXTREMITY VENOUS REFLUX Referring Phys: ORVIN DARING --------------------------------------------------------------------------------  Indications: Swelling, Pain, and varicosities.  Anticoagulation: Eliquis . Performing Technologist: Leafy Gibes RVS  Examination Guidelines: A complete evaluation includes B-mode imaging, spectral Doppler, color Doppler, and power Doppler as needed of all accessible portions of each vessel. Bilateral testing is considered an integral part of a complete examination. Limited examinations for reoccurring indications may be performed as noted. The reflux portion of the exam is performed with the patient in  reverse Trendelenburg. Significant venous reflux is defined as >500 ms in the superficial venous system, and >1 second in the deep venous system.  Venous Reflux Times +--------------+---------+------+-----------+------------+--------+ RIGHT         Reflux NoRefluxReflux TimeDiameter cmsComments                         Yes                                  +--------------+---------+------+-----------+------------+--------+ CFV           no                                             +--------------+---------+------+-----------+------------+--------+ FV prox       no                                             +--------------+---------+------+-----------+------------+--------+ FV mid        no                                             +--------------+---------+------+-----------+------------+--------+ FV dist       no                                             +--------------+---------+------+-----------+------------+--------+ Popliteal     no                                             +--------------+---------+------+-----------+------------+--------+ GSV at SFJ    no                            .82              +--------------+---------+------+-----------+------------+--------+ GSV  prox thigh          yes    998 ms       .71              +--------------+---------+------+-----------+------------+--------+ GSV mid thigh no                            .41              +--------------+---------+------+-----------+------------+--------+ GSV dist thighno                            .48              +--------------+---------+------+-----------+------------+--------+ GSV at knee   no                                             +--------------+---------+------+-----------+------------+--------+  +--------------+---------+------+-----------+------------+--------+ LEFT          Reflux NoRefluxReflux TimeDiameter cmsComments                          Yes                                  +--------------+---------+------+-----------+------------+--------+ CFV           no                                             +--------------+---------+------+-----------+------------+--------+ FV prox       no                                             +--------------+---------+------+-----------+------------+--------+ FV mid        no                                             +--------------+---------+------+-----------+------------+--------+ FV dist       no                                             +--------------+---------+------+-----------+------------+--------+ Popliteal     no                                             +--------------+---------+------+-----------+------------+--------+ GSV at SFJ    no                            .82              +--------------+---------+------+-----------+------------+--------+ GSV prox thighno                            .  56              +--------------+---------+------+-----------+------------+--------+ GSV mid thigh no                            .48              +--------------+---------+------+-----------+------------+--------+ GSV dist thighno                            .44              +--------------+---------+------+-----------+------------+--------+ GSV at knee   no                            .43              +--------------+---------+------+-----------+------------+--------+ GSV prox calf no                            .37              +--------------+---------+------+-----------+------------+--------+ SSV at [facility]                                  .35              +--------------+---------+------+-----------+------------+--------+   Summary: Bilateral: - No evidence of deep vein thrombosis seen in the lower extremities, bilaterally, from the common femoral through the popliteal veins. - No evidence of superficial venous  thrombosis in the lower extremities, bilaterally. - No evidence of deep venous insufficiency seen bilaterally in the lower extremity. - No evidence of superficial venous reflux seen in the short saphenous veins bilaterally.  Right: - Venous reflux is noted in the right greater saphenous vein in the thigh.  *See table(s) above for measurements and observations. Electronically signed by Cordella Shawl MD on 11/06/2023 at 9:47:56 AM.    Final    MM 3D SCREENING MAMMOGRAM BILATERAL BREAST Result Date: 10/16/2023 CLINICAL DATA:  Screening. EXAM: DIGITAL SCREENING BILATERAL MAMMOGRAM WITH TOMOSYNTHESIS AND CAD TECHNIQUE: Bilateral screening digital craniocaudal and mediolateral oblique mammograms were obtained. Bilateral screening digital breast tomosynthesis was performed. The images were evaluated with computer-aided detection. COMPARISON:  Previous exam(s). ACR Breast Density Category b: There are scattered areas of fibroglandular density. FINDINGS: There are no findings suspicious for malignancy. IMPRESSION: No mammographic evidence of malignancy. A result letter of this screening mammogram will be mailed directly to the patient. RECOMMENDATION: Screening mammogram in one year. (Code:SM-B-01Y) BI-RADS CATEGORY  1: Negative. Electronically Signed   By: Reyes Phi M.D.   On: 10/16/2023 12:27     Assessment/Plan 1. Varicose veins of both lower extremities with pain (Primary) Recommend:  The patient has large symptomatic varicose veins that are painful and associated with swelling.  I have had a discussion with the patient regarding  varicose veins and why they cause symptoms.  Patient will begin wearing graduated compression stockings class 1 on a daily basis, beginning first thing in the morning and removing them in the evening. The patient is instructed specifically not to sleep in the stockings.    The patient  will also begin using over-the-counter analgesics such as Motrin 600 mg po TID to help  control the symptoms.    In addition, behavioral modification including elevation during the day will be  initiated.    Venous reflux study done today shows normal deep system, severe reflux is noted throughout the proximal right great saphenous vein, no reflux noted in the left.  Pending the results of conservative therapy the patient will be reevaluated in three months.     2. Benign essential HTN Continue antihypertensive medications as already ordered, these medications have been reviewed and there are no changes at this time.  3. Paroxysmal atrial fibrillation (HCC) Continue antiarrhythmia medications as already ordered, these medications have been reviewed and there are no changes at this time.  Continue anticoagulation as ordered by Cardiology Service  4. Mixed hyperlipidemia Continue statin as ordered and reviewed, no changes at this time    Cordella Shawl, MD  11/12/2023 4:17 PM

## 2023-11-22 ENCOUNTER — Ambulatory Visit: Admitting: General Surgery

## 2023-11-30 ENCOUNTER — Ambulatory Visit

## 2023-11-30 DIAGNOSIS — Z0000 Encounter for general adult medical examination without abnormal findings: Secondary | ICD-10-CM

## 2023-11-30 NOTE — Progress Notes (Signed)
 Subjective:   Judith Wood is a 68 y.o. who presents for a Medicare Wellness preventive visit.  As a reminder, Annual Wellness Visits don't include a physical exam, and some assessments may be limited, especially if this visit is performed virtually. We may recommend an in-person follow-up visit with your provider if needed.  Visit Complete: Virtual I connected with  Judith Wood on 11/30/23 by a audio enabled telemedicine application and verified that I am speaking with the correct person using two identifiers.  Patient Location: Home  Provider Location: Home Office  I discussed the limitations of evaluation and management by telemedicine. The patient expressed understanding and agreed to proceed.  Vital Signs: Because this visit was a virtual/telehealth visit, some criteria may be missing or patient reported. Any vitals not documented were not able to be obtained and vitals that have been documented are patient reported.  VideoDeclined- This patient declined Librarian, academic. Therefore the visit was completed with audio only.  Persons Participating in Visit: Patient.  AWV Questionnaire: No: Patient Medicare AWV questionnaire was not completed prior to this visit.  Cardiac Risk Factors include: advanced age (>68men, >62 women);dyslipidemia;hypertension;sedentary lifestyle;obesity (BMI >30kg/m2)     Objective:    There were no vitals filed for this visit. There is no height or weight on file to calculate BMI.     11/30/2023    4:05 PM 10/10/2023   12:07 PM 09/10/2023   10:59 AM 09/21/2022    9:43 PM 09/20/2022   10:09 AM 09/29/2021   10:29 AM 09/10/2021    9:13 AM  Advanced Directives  Does Patient Have a Medical Advance Directive? No No Yes Yes No Yes Yes  Type of Surveyor, minerals;Living will Healthcare Power of [employer] of [place];Living will Healthcare Power of [place];Living will  Would  patient like information on creating a medical advance directive? No - Patient declined No - Patient declined   No - Patient declined      Current Medications (verified) Outpatient Encounter Medications as of 11/30/2023  Medication Sig   apixaban  (ELIQUIS ) 5 MG TABS tablet Take 1 tablet by mouth twice daily   b complex vitamins tablet Take 1 tablet by mouth daily.   Calcium Citrate-Vitamin D  (CALCIUM CITRATE + D PO) Take 2 tablets by mouth daily.   CHOLECALCIFEROL PO Take 5,000 Units by mouth daily.   D-MANNOSE PO Take 1 capsule by mouth daily.   diltiazem  (DILT-XR) 180 MG 24 hr capsule Take 1 capsule (180 mg total) by mouth daily.   Fe Bisgly-Vit C-Vit B12-FA (GENTLE IRON PO) Take 25 mg by mouth in the morning and at bedtime.   flecainide  (TAMBOCOR ) 100 MG tablet Take 1 tablet by mouth twice daily   glucosamine-chondroitin 500-400 MG tablet Take 1 tablet by mouth 3 (three) times daily.   magnesium 30 MG tablet Take 30 mg by mouth 2 (two) times daily.   metoprolol  succinate (TOPROL -XL) 50 MG 24 hr tablet TAKE 1 TABLET BY MOUTH ONCE DAILY WITH OR IMMEDIATLY FOLLOWING A MEAL   No facility-administered encounter medications on file as of 11/30/2023.    Allergies (verified) Norco [hydrocodone-acetaminophen ] and Tetracyclines & related   History: Past Medical History:  Diagnosis Date   Abnormal mammogram of right breast 01/18/2016   Afib (HCC)    Dysrhythmia    Hypertension    Past Surgical History:  Procedure Laterality Date   APPENDECTOMY  1974   CHOLECYSTECTOMY  2005  COLONOSCOPY WITH PROPOFOL  N/A 09/29/2021   Procedure: COLONOSCOPY WITH PROPOFOL ;  Surgeon: Therisa Bi, MD;  Location: [facility] ENDOSCOPY;  Service: Gastroenterology;  Laterality: N/A;   ROUX-EN-Y GASTRIC BYPASS  2009   TOTAL ABDOMINAL HYSTERECTOMY  2000   WRIST SURGERY Right 2017   Family History  Problem Relation Age of Onset   Hypertension Mother    Arrhythmia Mother        Pacemaker implant    Heart attack  Father 64   Arrhythmia Sister    Arrhythmia Brother    Arrhythmia Brother    Breast cancer Neg Hx    Social History   Socioeconomic History   Marital status: Married    Spouse name: Not on file   Number of children: 1   Years of education: Not on file   Highest education level: Not on file  Occupational History   Not on file  Tobacco Use   Smoking status: Former    Types: Cigarettes    Passive exposure: Never   Smokeless tobacco: Never  Vaping Use   Vaping status: Never Used  Substance and Sexual Activity   Alcohol use: No   Drug use: No   Sexual activity: Not on file  Other Topics Concern   Not on file  Social History Narrative   Not on file   Social Drivers of Health   Financial Resource Strain: Low Risk  (11/30/2023)   Overall Financial Resource Strain (CARDIA)    Difficulty of Paying Living Expenses: Not hard at all  Food Insecurity: No Food Insecurity (11/30/2023)   Hunger Vital Sign    Worried About Running Out of Food in the Last Year: Never true    Ran Out of Food in the Last Year: Never true  Transportation Needs: No Transportation Needs (11/30/2023)   PRAPARE - Administrator, Civil Service (Medical): No    Lack of Transportation (Non-Medical): No  Physical Activity: Inactive (11/30/2023)   Exercise Vital Sign    Days of Exercise per Week: 0 days    Minutes of Exercise per Session: 0 min  Stress: No Stress Concern Present (11/30/2023)   [employer] of Occupational Health - Occupational Stress Questionnaire    Feeling of Stress: Not at all  Social Connections: Socially Integrated (11/30/2023)   Social Connection and Isolation Panel    Frequency of Communication with Friends and Family: More than three times a week    Frequency of Social Gatherings with Friends and Family: Three times a week    Attends Religious Services: More than 4 times per year    Active Member of Clubs or Organizations: Yes    Attends Engineer, structural:  More than 4 times per year    Marital Status: Married    Tobacco Counseling Counseling given: Not Answered    Clinical Intake:  Pre-visit preparation completed: Yes  Pain : No/denies pain     BMI - recorded: 42.6 Nutritional Status: BMI > 30  Obese Nutritional Risks: None Diabetes: No  Lab Results  Component Value Date   HGBA1C 5.8 (H) 03/23/2023   HGBA1C 5.7 (H) 10/02/2022   HGBA1C 5.7 (A) 03/10/2022     How often do you need to have someone help you when you read instructions, pamphlets, or other written materials from your doctor or pharmacy?: 1 - Never  Interpreter Needed?: No  Information entered by :: JHONNIE DAS, LPN   Activities of Daily Living     11/30/2023  4:09 PM  In your present state of health, do you have any difficulty performing the following activities:  Hearing? 0  Vision? 0  Difficulty concentrating or making decisions? 0  Walking or climbing stairs? 1  Comment GOING UP IS A CHALLENGE  Dressing or bathing? 0  Doing errands, shopping? 0  Preparing Food and eating ? N  Using the Toilet? N  In the past six months, have you accidently leaked urine? N  Do you have problems with loss of bowel control? N  Managing your Medications? N  Managing your Finances? N  Housekeeping or managing your Housekeeping? N    Patient Care Team: Edman Marsa PARAS, DO as PCP - General (Family Medicine) Perla Evalene PARAS, MD as PCP - Cardiology (Cardiology) Alpha Chloe SAUNDERS, MD as Referring Physician (Dermatology) Pa, [facility] (Optometry)  I have updated your Care Teams any recent Medical Services you may have received from other providers in the past year.     Assessment:   This is a routine wellness examination for Breane.  Hearing/Vision screen Hearing Screening - Comments:: NO AIDS Vision Screening - Comments:: WEARS GLASSES ALL DAY- [facility]- DR.DINGELDEIN- HAS APPT ON MONDAY   Goals Addressed             This  Visit's Progress    DIET - INCREASE WATER INTAKE         Depression Screen     11/30/2023    4:02 PM 09/25/2023   10:10 AM 03/20/2023    9:38 AM 03/20/2023    9:37 AM 09/20/2022   10:07 AM 09/15/2022    9:56 AM 03/10/2022    1:35 PM  PHQ 2/9 Scores  PHQ - 2 Score 0 0  0 0    PHQ- 9 Score 0    0    Exception Documentation   Patient refusal   Patient refusal Patient refusal    Fall Risk     11/30/2023    4:08 PM 11/12/2023    3:38 PM 09/25/2023   10:10 AM 03/20/2023    9:37 AM 09/20/2022   10:09 AM  Fall Risk   Falls in the past year? 1 1 1 1  0  Number falls in past yr: 0 1 0 1 0  Injury with Fall? 0 1 1 1  0  Risk for fall due to :  History of fall(s);Impaired balance/gait;Orthopedic patient History of fall(s) History of fall(s) No Fall Risks  Follow up Falls evaluation completed;Falls prevention discussed Falls evaluation completed;Education provided  Falls evaluation completed Falls prevention discussed;Falls evaluation completed    MEDICARE RISK AT HOME:  Medicare Risk at Home Any stairs in or around the home?: Yes If so, are there any without handrails?: No Home free of loose throw rugs in walkways, pet beds, electrical cords, etc?: Yes Adequate lighting in your home to reduce risk of falls?: Yes Life alert?: No Use of a cane, walker or w/c?: No Grab bars in the bathroom?: Yes Shower chair or bench in shower?: Yes Elevated toilet seat or a handicapped toilet?: Yes  TIMED UP AND GO:  Was the test performed?  No  Cognitive Function: 6CIT completed        11/30/2023    4:14 PM  6CIT Screen  What Year? 0 points  What month? 0 points  What time? 0 points  Count back from 20 0 points  Months in reverse 0 points  Repeat phrase 0 points  Total Score 0 points  Immunizations Immunization History  Administered Date(s) Administered   PFIZER Comirnaty(Gray Top)Covid-19 Tri-Sucrose Vaccine 06/25/2019, 07/17/2019   PNEUMOCOCCAL CONJUGATE-20 09/09/2021   Tdap 09/09/2020    Zoster Recombinant(Shingrix ) 10/07/2021, 12/27/2021    Screening Tests Health Maintenance  Topic Date Due   COVID-19 Vaccine (3 - Pfizer risk series) 08/14/2019   DEXA SCAN  Never done   Influenza Vaccine  Never done   Mammogram  10/14/2024   Medicare Annual Wellness (AWV)  11/29/2024   DTaP/Tdap/Td (2 - Td or Tdap) 09/10/2030   Colonoscopy  09/30/2031   Pneumococcal Vaccine: 50+ Years  Completed   Hepatitis C Screening  Completed   Zoster Vaccines- Shingrix   Completed   HPV VACCINES  Aged Out   Meningococcal B Vaccine  Aged Out    Health Maintenance Items Addressed: DOESN'T TAKE FLU; UP TO DATE ON PNA, TDAP & SHINGRIX ; UP TO DATE ON MAMMOGRAM, COLONOSCOPY; DECLINES BDS  Additional Screening:  Vision Screening: Recommended annual ophthalmology exams for early detection of glaucoma and other disorders of the eye. Is the patient up to date with their annual eye exam?  Yes  Who is the provider or what is the name of the office in which the patient attends annual eye exams? [facility]  Dental Screening: Recommended annual dental exams for proper oral hygiene  Community Resource Referral / Chronic Care Management: CRR required this visit?  No   CCM required this visit?  No   Plan:    I have personally reviewed and noted the following in the patient's chart:   Medical and social history Use of alcohol, tobacco or illicit drugs  Current medications and supplements including opioid prescriptions. Patient is not currently taking opioid prescriptions. Functional ability and status Nutritional status Physical activity Advanced directives List of other physicians Hospitalizations, surgeries, and ER visits in previous 12 months Vitals Screenings to include cognitive, depression, and falls Referrals and appointments  In addition, I have reviewed and discussed with patient certain preventive protocols, quality metrics, and best practice recommendations. A written  personalized care plan for preventive services as well as general preventive health recommendations were provided to patient.   Jhonnie GORMAN Das, LPN   [id number]   After Visit Summary: (MyChart) Due to this being a telephonic visit, the after visit summary with patients personalized plan was offered to patient via MyChart   Notes: Nothing significant to report at this time.

## 2023-11-30 NOTE — Patient Instructions (Addendum)
 [name],  Thank you for taking the time for your Medicare Wellness Visit. I appreciate your continued commitment to your health goals. Please review the care plan we discussed, and feel free to reach out if I can assist you further. It was a pleasure speaking with you today!  Medicare recommends these wellness visits once per year to help you and your care team stay ahead of potential health issues. These visits are designed to focus on prevention, allowing your provider to concentrate on managing your acute and chronic conditions during your regular appointments.  Please note that Annual Wellness Visits do not include a physical exam. Some assessments may be limited, especially if the visit was conducted virtually. If needed, we may recommend a separate in-person follow-up with your provider.  Ongoing Care Seeing your primary care provider every 3 to 6 months helps us  monitor your health and provide consistent, personalized care.   Referrals If a referral was made during today's visit and you haven't received any updates within two weeks, please contact the referred provider directly to check on the status.  Recommended Screenings:  Health Maintenance  Topic Date Due   COVID-19 Vaccine (3 - Pfizer risk series) 08/14/2019   DEXA scan (bone density measurement)  Never done   Flu Shot  Never done   Breast Cancer Screening  10/14/2024   Medicare Annual Wellness Visit  11/29/2024   DTaP/Tdap/Td vaccine (2 - Td or Tdap) 09/10/2030   Colon Cancer Screening  09/30/2031   Pneumococcal Vaccine for age over 17  Completed   Hepatitis C Screening  Completed   Zoster (Shingles) Vaccine  Completed   HPV Vaccine  Aged Out   Meningitis B Vaccine  Aged Out    Advance Care Planning is important because it: Ensures you receive medical care that aligns with your values, goals, and preferences. Provides guidance to your family and loved ones, reducing the emotional burden of decision-making during  critical moments.  Vision: Annual vision screenings are recommended for early detection of glaucoma, cataracts, and diabetic retinopathy. These exams can also reveal signs of chronic conditions such as diabetes and high blood pressure.  Dental: Annual dental screenings help detect early signs of oral cancer, gum disease, and other conditions linked to overall health, including heart disease and diabetes.  Please see the attached documents for additional preventive care recommendations.   NEXT AWV 12/19/24 @ 4:00 PM BY PHONE

## 2023-12-03 LAB — OPHTHALMOLOGY REPORT-SCANNED

## 2023-12-18 ENCOUNTER — Ambulatory Visit

## 2023-12-18 ENCOUNTER — Ambulatory Visit (INDEPENDENT_AMBULATORY_CARE_PROVIDER_SITE_OTHER): Admitting: Podiatry

## 2023-12-18 DIAGNOSIS — M778 Other enthesopathies, not elsewhere classified: Secondary | ICD-10-CM

## 2023-12-18 DIAGNOSIS — Q667 Congenital pes cavus, unspecified foot: Secondary | ICD-10-CM | POA: Diagnosis not present

## 2023-12-18 NOTE — Progress Notes (Signed)
 Subjective:  Patient ID: Judith Wood, female    DOB: 01-14-1956,  MRN: [id number]  Chief Complaint  Patient presents with   Foot Pain    She had left foot pain x 1 day. There is swelling present and very painful to walk     68 y.o. female presents with the above complaint.  Patient presents for follow-up of left extensor tendinitis she states is very painful just came out of nowhere and she would like to discuss treatment options for it has not seen MRIs prior to seeing me pain scale 7 out of 10 dull aching nature   Review of Systems: Negative except as noted in the HPI. Denies N/V/F/Ch.  Past Medical History:  Diagnosis Date   Abnormal mammogram of right breast 01/18/2016   Afib (HCC)    Dysrhythmia    Hypertension     Current Outpatient Medications:    apixaban  (ELIQUIS ) 5 MG TABS tablet, Take 1 tablet by mouth twice daily, Disp: 180 tablet, Rfl: 1   b complex vitamins tablet, Take 1 tablet by mouth daily., Disp: , Rfl:    Calcium Citrate-Vitamin D  (CALCIUM CITRATE + D PO), Take 2 tablets by mouth daily., Disp: , Rfl:    CHOLECALCIFEROL PO, Take 5,000 Units by mouth daily., Disp: , Rfl:    D-MANNOSE PO, Take 1 capsule by mouth daily., Disp: , Rfl:    diltiazem  (DILT-XR) 180 MG 24 hr capsule, Take 1 capsule (180 mg total) by mouth daily., Disp: 90 capsule, Rfl: 1   Fe Bisgly-Vit C-Vit B12-FA (GENTLE IRON PO), Take 25 mg by mouth in the morning and at bedtime., Disp: , Rfl:    flecainide  (TAMBOCOR ) 100 MG tablet, Take 1 tablet by mouth twice daily, Disp: 180 tablet, Rfl: 3   glucosamine-chondroitin 500-400 MG tablet, Take 1 tablet by mouth 3 (three) times daily., Disp: , Rfl:    magnesium 30 MG tablet, Take 30 mg by mouth 2 (two) times daily., Disp: , Rfl:    metoprolol  succinate (TOPROL -XL) 50 MG 24 hr tablet, TAKE 1 TABLET BY MOUTH ONCE DAILY WITH OR IMMEDIATLY FOLLOWING A MEAL, Disp: 90 tablet, Rfl: 3  Social History   Tobacco Use  Smoking Status Former   Types:  Cigarettes   Passive exposure: Never  Smokeless Tobacco Never    Allergies  Allergen Reactions   Norco [Hydrocodone-Acetaminophen ] Itching   Tetracyclines & Related Itching and Rash   Objective:  There were no vitals filed for this visit. There is no height or weight on file to calculate BMI. Constitutional Well developed. Well nourished.  Vascular Dorsalis pedis pulses palpable bilaterally. Posterior tibial pulses palpable bilaterally. Capillary refill normal to all digits.  No cyanosis or clubbing noted. Pedal hair growth normal.  Neurologic Normal speech. Oriented to person, place, and time. Epicritic sensation to light touch grossly present bilaterally.  Dermatologic Nails well groomed and normal in appearance. No open wounds. No skin lesions.  Orthopedic: Pain along the course of the extensor tendon pain with resisted dorsiflexion of the toes no pain with plantarflexion inversion of the toes.  No pain at the Lisfranc interval.  No midfoot pain   Radiographs: 3 views of skeletally mature adult left foot: No fractures noted no bony abnormalities noted midfoot arthritis noted pes cavus foot structure noted plantar and posterior heel spurring noted Assessment:   1. Extensor tendinitis of foot    Plan:  Patient was evaluated and treated and all questions answered.  Left foot extensor tendinitis with underlying  pes cavus foot structure - All questions and concerns were discussed with the patient in extensive detail - Given the amount of pain that she is experiencing she would benefit from a steroid injection to help decrease acute femoral component associated pain.  Patient agrees with plan elected proceed with steroid injection\I discussed the risk of rupture associate with this she states understand despite the risks -A steroid injection was performed at left dorsal forefoot using 1% plain Lidocaine  and 10 mg of Kenalog. This was well tolerated.  Pes cavus - All questions  and concerns were discussed with the patient in extensive detail I discussed shoe gear modification.  I encouraged her to wear good shoes and insoles.  She states understanding.   No follow-ups on file.

## 2023-12-25 ENCOUNTER — Ambulatory Visit (INDEPENDENT_AMBULATORY_CARE_PROVIDER_SITE_OTHER): Admitting: Podiatry

## 2023-12-25 DIAGNOSIS — M7752 Other enthesopathy of left foot: Secondary | ICD-10-CM

## 2023-12-25 DIAGNOSIS — M778 Other enthesopathies, not elsewhere classified: Secondary | ICD-10-CM

## 2023-12-25 NOTE — Progress Notes (Signed)
 Subjective:  Patient ID: Judith Wood, female    DOB: Dec 13, 1955,  MRN: [id number]  Chief Complaint  Patient presents with   Foot Pain    68 y.o. female presents with the above complaint.  Patient presents for follow-up of left extensor tendinitis she states is still very painful injection did not help she would like to discuss next treatment plan denies any other acute issues  Review of Systems: Negative except as noted in the HPI. Denies N/V/F/Ch.  Past Medical History:  Diagnosis Date   Abnormal mammogram of right breast 01/18/2016   Afib (HCC)    Dysrhythmia    Hypertension     Current Outpatient Medications:    apixaban  (ELIQUIS ) 5 MG TABS tablet, Take 1 tablet by mouth twice daily, Disp: 180 tablet, Rfl: 1   b complex vitamins tablet, Take 1 tablet by mouth daily., Disp: , Rfl:    Calcium Citrate-Vitamin D  (CALCIUM CITRATE + D PO), Take 2 tablets by mouth daily., Disp: , Rfl:    CHOLECALCIFEROL PO, Take 5,000 Units by mouth daily., Disp: , Rfl:    D-MANNOSE PO, Take 1 capsule by mouth daily., Disp: , Rfl:    diltiazem  (DILT-XR) 180 MG 24 hr capsule, Take 1 capsule (180 mg total) by mouth daily., Disp: 90 capsule, Rfl: 1   Fe Bisgly-Vit C-Vit B12-FA (GENTLE IRON PO), Take 25 mg by mouth in the morning and at bedtime., Disp: , Rfl:    flecainide  (TAMBOCOR ) 100 MG tablet, Take 1 tablet by mouth twice daily, Disp: 180 tablet, Rfl: 3   glucosamine-chondroitin 500-400 MG tablet, Take 1 tablet by mouth 3 (three) times daily., Disp: , Rfl:    magnesium 30 MG tablet, Take 30 mg by mouth 2 (two) times daily., Disp: , Rfl:    metoprolol  succinate (TOPROL -XL) 50 MG 24 hr tablet, TAKE 1 TABLET BY MOUTH ONCE DAILY WITH OR IMMEDIATLY FOLLOWING A MEAL, Disp: 90 tablet, Rfl: 3  Social History   Tobacco Use  Smoking Status Former   Types: Cigarettes   Passive exposure: Never  Smokeless Tobacco Never    Allergies  Allergen Reactions   Norco [Hydrocodone-Acetaminophen ] Itching    Tetracyclines & Related Itching and Rash   Objective:  There were no vitals filed for this visit. There is no height or weight on file to calculate BMI. Constitutional Well developed. Well nourished.  Vascular Dorsalis pedis pulses palpable bilaterally. Posterior tibial pulses palpable bilaterally. Capillary refill normal to all digits.  No cyanosis or clubbing noted. Pedal hair growth normal.  Neurologic Normal speech. Oriented to person, place, and time. Epicritic sensation to light touch grossly present bilaterally.  Dermatologic Nails well groomed and normal in appearance. No open wounds. No skin lesions.  Orthopedic: Pain along the course of the extensor tendon pain with resisted dorsiflexion of the toes no pain with plantarflexion inversion of the toes.  No pain at the Lisfranc interval.  No midfoot pain   Radiographs: 3 views of skeletally mature adult left foot: No fractures noted no bony abnormalities noted midfoot arthritis noted pes cavus foot structure noted plantar and posterior heel spurring noted Assessment:   No diagnosis found.  Plan:  Patient was evaluated and treated and all questions answered.  Left foot extensor tendinitis with underlying pes cavus foot structure - All questions and concerns were discussed with the patient in extensive detail - Clinically the injection did not help at this time patient will benefit from cam boot immobilization given that she still has a  lot of pain.  Cam boot was dispensed. - If there is no improvement we will discuss MRI during next visit  Pes cavus - All questions and concerns were discussed with the patient in extensive detail I discussed shoe gear modification.  I encouraged her to wear good shoes and insoles.  She states understanding.   No follow-ups on file.

## 2024-01-22 ENCOUNTER — Ambulatory Visit: Admitting: Podiatry

## 2024-01-22 DIAGNOSIS — M778 Other enthesopathies, not elsewhere classified: Secondary | ICD-10-CM

## 2024-01-22 NOTE — Progress Notes (Signed)
 Subjective:  Patient ID: Judith Wood, female    DOB: [birth year],  MRN: [id number]  Chief Complaint  Patient presents with   Foot Pain    68 y.o. female presents with the above complaint.  Patient presents for follow-up of left extensor tendinitis she states is doing a lot better cam boot immobilization helped.  She would like to discuss next treatment plan  Review of Systems: Negative except as noted in the HPI. Denies N/V/F/Ch.  Past Medical History:  Diagnosis Date   Abnormal mammogram of right breast 01/18/2016   Afib (HCC)    Dysrhythmia    Hypertension     Current Outpatient Medications:    apixaban  (ELIQUIS ) 5 MG TABS tablet, Take 1 tablet by mouth twice daily, Disp: 180 tablet, Rfl: 1   b complex vitamins tablet, Take 1 tablet by mouth daily., Disp: , Rfl:    Calcium Citrate-Vitamin D  (CALCIUM CITRATE + D PO), Take 2 tablets by mouth daily., Disp: , Rfl:    CHOLECALCIFEROL PO, Take 5,000 Units by mouth daily., Disp: , Rfl:    D-MANNOSE PO, Take 1 capsule by mouth daily., Disp: , Rfl:    diltiazem  (DILT-XR) 180 MG 24 hr capsule, Take 1 capsule (180 mg total) by mouth daily., Disp: 90 capsule, Rfl: 1   Fe Bisgly-Vit C-Vit B12-FA (GENTLE IRON PO), Take 25 mg by mouth in the morning and at bedtime., Disp: , Rfl:    flecainide  (TAMBOCOR ) 100 MG tablet, Take 1 tablet by mouth twice daily, Disp: 180 tablet, Rfl: 3   glucosamine-chondroitin 500-400 MG tablet, Take 1 tablet by mouth 3 (three) times daily., Disp: , Rfl:    magnesium 30 MG tablet, Take 30 mg by mouth 2 (two) times daily., Disp: , Rfl:    metoprolol  succinate (TOPROL -XL) 50 MG 24 hr tablet, TAKE 1 TABLET BY MOUTH ONCE DAILY WITH OR IMMEDIATLY FOLLOWING A MEAL, Disp: 90 tablet, Rfl: 3  Social History   Tobacco Use  Smoking Status Former   Types: Cigarettes   Passive exposure: Never  Smokeless Tobacco Never    Allergies  Allergen Reactions   Norco [Hydrocodone-Acetaminophen ] Itching   Tetracyclines & Related  Itching and Rash   Objective:  There were no vitals filed for this visit. There is no height or weight on file to calculate BMI. Constitutional Well developed. Well nourished.  Vascular Dorsalis pedis pulses palpable bilaterally. Posterior tibial pulses palpable bilaterally. Capillary refill normal to all digits.  No cyanosis or clubbing noted. Pedal hair growth normal.  Neurologic Normal speech. Oriented to person, place, and time. Epicritic sensation to light touch grossly present bilaterally.  Dermatologic Nails well groomed and normal in appearance. No open wounds. No skin lesions.  Orthopedic: Very mild pain along the course of the extensor tendon pain with resisted dorsiflexion of the toes no pain with plantarflexion inversion of the toes.  No pain at the Lisfranc interval.  No midfoot pain   Radiographs: 3 views of skeletally mature adult left foot: No fractures noted no bony abnormalities noted midfoot arthritis noted pes cavus foot structure noted plantar and posterior heel spurring noted Assessment:   No diagnosis found.  Plan:  Patient was evaluated and treated and all questions answered.  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  Cam boot immobilization helped considerably.  At this time she will transition out of cam boot into regular  shoes with Tri-Lock ankle brace Tri-Lock ankle brace was dispensed. - Shoe gear modification discussed  Pes cavus - All questions and concerns were discussed with the patient in extensive detail I discussed shoe gear modification.  I encouraged her to wear good shoes and insoles.  She states understanding.   No follow-ups on file.

## 2024-02-04 ENCOUNTER — Encounter: Payer: Self-pay | Admitting: Podiatry

## 2024-02-07 ENCOUNTER — Telehealth: Payer: Self-pay

## 2024-02-07 ENCOUNTER — Ambulatory Visit: Payer: Self-pay

## 2024-02-07 NOTE — Telephone Encounter (Signed)
 No further action needed at this time.

## 2024-02-07 NOTE — Progress Notes (Addendum)
 Subjective Patient ID: Judith Wood is a 68 y.o. female.    Judith Wood is a 68 y.o. female presents to the clinic complaining of body aches, fatigue, cough, and fever.  She reports some discomfort under her ribs due to coughing.  She reports that when she was lying down last night, she felt like she heard some wheezing.  Patient reports her fever was around 100.21F.  She has taken Tylenol  and OTC cough medicines for her symptoms.  Denies known sick contacts.  Denies history of asthma or other breathing problems.  Patient has history of pneumonia and bronchitis in the past.    History provided by:  Patient Language interpreter used: No     Review of Systems  Constitutional:  Positive for fever. Negative for chills and fatigue.  HENT:  Negative for congestion, ear discharge, ear pain, postnasal drip, rhinorrhea, sinus pressure, sinus pain, sore throat, trouble swallowing and voice change.   Respiratory:  Positive for cough and wheezing. Negative for chest tightness and shortness of breath.   Musculoskeletal:  Positive for myalgias.  Neurological:  Negative for headaches.    Patient History  Allergies: Allergies  Allergen Reactions  . Azithromycin  Other    Cannot take due to Afib  . Hydrocodone-Acetaminophen  Itching and Unknown  . Tetracyclines & Related Rash    Past Medical History:  Diagnosis Date  . Hypertension    History reviewed. No pertinent surgical history. Social History   Socioeconomic History  . Marital status: Married    Spouse name: Not on file  . Number of children: Not on file  . Years of education: Not on file  . Highest education level: Not on file  Occupational History  . Not on file  Tobacco Use  . Smoking status: Never  . Smokeless tobacco: Never  Substance and Sexual Activity  . Alcohol use: Not on file  . Drug use: Not on file  . Sexual activity: Not on file  Other Topics Concern  . Not on file  Social History Narrative  . Not on file    History reviewed. No pertinent family history. Current Outpatient Medications on File Prior to Visit  Medication Sig Dispense Refill  . dilTIAZem  ER (Tiazac ) 180 MG 24 hr capsule Take 180 mg by mouth 1 (one) time each day.    . Eliquis  5 MG tablet Take 5 mg by mouth in the morning and 5 mg before bedtime.    . flecainide  (Tambocor ) 100 MG tablet Take 100 mg by mouth in the morning and 100 mg before bedtime.    . metoprolol  succinate XL (Toprol -XL) 50 MG 24 hr tablet TAKE 1 TABLET BY MOUTH ONCE DAILY WITH OR IMMEDIATLY FOLLOWING A MEAL     No current facility-administered medications on file prior to visit.    Objective  Vitals:   02/07/24 1052  BP: (!) 141/87  Pulse: 64  Resp: 19  Temp: 37.1 C (98.7 F)  TempSrc: Tympanic  SpO2: 96%  Weight: 115 kg  Height: 5' 5  PainSc: 0-No pain        OBGYN/Pregnancy Status: Postmenopausal      No results found.  Physical Exam Vitals and nursing note reviewed.  Constitutional:      Appearance: Normal appearance.  HENT:     Head: Normocephalic.     Right Ear: Tympanic membrane, ear canal and external ear normal.     Left Ear: Tympanic membrane, ear canal and external ear normal.     Nose: No  congestion or rhinorrhea.     Right Turbinates: Not enlarged or swollen.     Left Turbinates: Not enlarged or swollen.     Mouth/Throat:     Lips: Pink.     Mouth: Mucous membranes are moist.     Tongue: No lesions.     Palate: No lesions.     Pharynx: Oropharynx is clear. Uvula midline. No posterior oropharyngeal erythema.     Tonsils: No tonsillar exudate. 0 on the right. 0 on the left.  Eyes:     Conjunctiva/sclera: Conjunctivae normal.  Neck:     Trachea: Phonation normal.  Cardiovascular:     Rate and Rhythm: Normal rate and regular rhythm.     Heart sounds: Normal heart sounds.  Pulmonary:     Effort: Pulmonary effort is normal.     Breath sounds: Normal air entry. Decreased breath sounds present.     Comments: Speaking  in full sentences, no increase in work of breathing ambulating through the clinic.  Slightly diminished breath sounds in bases.  Patient not taking deep breaths due to triggering coughing episodes. Musculoskeletal:     Cervical back: Normal range of motion and neck supple. No rigidity. Normal range of motion.  Lymphadenopathy:     Head:     Right side of head: No submental, submandibular, tonsillar, preauricular, posterior auricular or occipital adenopathy.     Left side of head: No submental, submandibular, tonsillar, preauricular, posterior auricular or occipital adenopathy.     Cervical: No cervical adenopathy.  Skin:    General: Skin is warm and dry.     Capillary Refill: Capillary refill takes less than 2 seconds.  Neurological:     Mental Status: She is alert.  Psychiatric:        Mood and Affect: Mood normal.        Behavior: Behavior normal.     Results for orders placed or performed in visit on 02/07/24  XR chest 2 views   Narrative   Preliminary Read:  Hazy opacity in right lower lobe.  Normal cardiac silhouette.  No pleural effusion, or pneumothorax.    POCT QuickVue Antigen Test  Component Result   Rapid Covid Negative   Internal Quality Control Pass  POCT RAPID INFLUENZA MCKESSON  Component Result   Rapid Influenza A Ag Negative   Rapid Influenza B Ag Negative   Internal Quality Control Pass       Procedures MDM:     1 Acute complicated illness or injury     Explanation of Medical Decision Making and variances from expected care:    COVID and influenza negative.   Chest x-ray obtained due to patient's history.  Preliminary read of x-ray with possible hazy opacity in right lower lobe.  Concern for pneumonia.  No wheezing on exam.  No known history of asthma.  Do not feel patient requires inhaler or corticosteroids at this time.  Patient is unable to take tetracyclines or azithromycin .  Will do monotherapy with respiratory fluoroquinolone.   RX Levaquin .   Discussed supportive care and OTC medications for symptomatic management. Recheck if worsening or not improving.  Recommended PCP follow up.  Return precautions given.    Unique ordered tests: Two     Review of any test results: Two     Assessment requiring historian other than patient: No     Independent visualization of image, tracing, or test: No     Discussion of management with another provider: No  Risk:: Moderate            Assessment/Plan Diagnoses and all orders for this visit:  Community acquired bacterial pneumonia -     levoFLOXacin  (Levaquin ) 750 MG tablet; Take 1 tablet (750 mg total) by mouth 1 (one) time each day for 5 days.  Acute cough -     POCT QuickVue Antigen Test -     POCT RAPID INFLUENZA MCKESSON -     XR chest 2 views  Low grade fever -     POCT QuickVue Antigen Test -     POCT RAPID INFLUENZA MCKESSON -     XR chest 2 views     Disposition Status: Home  Patient Instructions  Based off chest X-ray results including your clinical presentation, you have been diagnosed with pneumonia.   Control any symptoms of fever with Tylenol , NSAID (Ibuprofen  or Naproxen). Drink plenty of oral fluids to help loosen up secretions to bring up phlegm. Coughing up the phlegm is one way your body can help rid of the infection in the lungs. It is recommended to NOT use any cough suppressants such Robitussin, Vicks Cough and Cold! A warm honey and lemon non caffeinated can help relieve discomfort caused by coughing.  Drink warm non caffeine tea, take steamy baths to ease your breathing. Stay away from any smoke environments or smoking if you do smoke to let your lungs heal. Get lots of rest!  While you are recovering from pneumonia, try to limit contact with others to keep the germs from spreading. Cover and your nose and mouth when coughing or sneezing. Wash your hand frequently.   Once you have completed the oral antibiotic used to treat your pneumonia it is  recommended to have a repeat chest X-ray in approximately 3-4 weeks to assure that the infection is improving or has resolved. Please follow up with your primary care physician for repeat chest x-ray during this time frame. If you do not have a primary care physician please return back to the clinic.   If you do not notice any improvements despite the antibiotic treatment, recommend to follow up with your primary physician further evaluation and management of your symptoms. If you do not have a primary care physician, patient to return back to the clinic.   If any development of severe symptoms of shortness of breath, chest pain, persistent elevated heart rate or decreased heart rate, persistent fevers despite anti-pyretic medication, bluish discoloration in nails or lips, conversational trouble breathing, confusion, low blood pressure recommend to follow up in the ED immediately for further evaluation and management!   Progress note signed by Gerard Gaskins, PA on 02/07/24 at 12:13 PM

## 2024-02-07 NOTE — Telephone Encounter (Signed)
 FYI Only or Action Required?: FYI only for provider: ED advised.  Patient was last seen in primary care on 09/25/2023 by Edman Marsa PARAS, DO.  Called Nurse Triage reporting Shortness of Breath.  Symptoms began today.  Interventions attempted: Nothing.  Symptoms are: unchanged.  Triage Disposition: Go to ED Now (or PCP Triage)  Patient/caregiver understands and will follow disposition?:   Copied from CRM #[id number]. Topic: Clinical - Red Word Triage >> Feb 07, 2024  9:28 AM Robinson H wrote: [facility] that prompted transfer to Nurse Triage: Hurts to breathe, low grade fever, hurts when she breathes in Reason for Disposition  Taking a deep breath makes pain worse  Answer Assessment - Initial Assessment Questions Advised ED.  Patient declines ED and states I not sitting all day in emergency room and requests appt with PCP.  Advised 911 if symptoms worsen.  1. LOCATION: Where does it hurt?       Sternum pain when taking deep breath in; feels like tingling cooling sensation. Patient reports been having dry cough and not feeling well.  Protocols used: Chest Pain-A-AH

## 2024-02-07 NOTE — Progress Notes (Signed)
 Fever, body aches and cough x 4 days.

## 2024-02-07 NOTE — Telephone Encounter (Signed)
 Copied from CRM #[id number]. Topic: Clinical - Lab/Test Results >> Feb 07, 2024  1:31 PM Delon HERO wrote: Reason for CRM: Patient is returning nurse call. Reporting that she went to the UC. Positive for pneumonia. Declined follow up appointment with Dr. MARLA.

## 2024-02-07 NOTE — Telephone Encounter (Signed)
 I see that this triage call is listed as FYI - however it says that patient declined ED and requested appointment.  Would you be able to follow up with her and find out if she does plan to go to ED or if instead I would suggest she could go to an Urgent Care instead of ED. That would be reasonable as well.  I am not back in office until next week, it sounds like she should receive some evaluation more ASAP based on triage.  Marsa Officer, DO [facility] 02/07/2024, 10:35 AM

## 2024-02-11 ENCOUNTER — Encounter (INDEPENDENT_AMBULATORY_CARE_PROVIDER_SITE_OTHER): Payer: Self-pay

## 2024-02-11 ENCOUNTER — Emergency Department

## 2024-02-11 ENCOUNTER — Observation Stay
Admission: EM | Admit: 2024-02-11 | Discharge: 2024-02-12 | Disposition: A | Attending: Internal Medicine | Admitting: Internal Medicine

## 2024-02-11 ENCOUNTER — Other Ambulatory Visit: Payer: Self-pay

## 2024-02-11 ENCOUNTER — Ambulatory Visit (INDEPENDENT_AMBULATORY_CARE_PROVIDER_SITE_OTHER): Admitting: Vascular Surgery

## 2024-02-11 DIAGNOSIS — R109 Unspecified abdominal pain: Principal | ICD-10-CM

## 2024-02-11 DIAGNOSIS — I480 Paroxysmal atrial fibrillation: Secondary | ICD-10-CM | POA: Diagnosis present

## 2024-02-11 DIAGNOSIS — E66813 Obesity, class 3: Secondary | ICD-10-CM | POA: Diagnosis present

## 2024-02-11 DIAGNOSIS — R7989 Other specified abnormal findings of blood chemistry: Secondary | ICD-10-CM

## 2024-02-11 DIAGNOSIS — B179 Acute viral hepatitis, unspecified: Secondary | ICD-10-CM | POA: Diagnosis present

## 2024-02-11 DIAGNOSIS — E871 Hypo-osmolality and hyponatremia: Secondary | ICD-10-CM

## 2024-02-11 DIAGNOSIS — I10 Essential (primary) hypertension: Secondary | ICD-10-CM | POA: Diagnosis present

## 2024-02-11 LAB — BASIC METABOLIC PANEL WITH GFR
Anion gap: 13 (ref 5–15)
BUN: 17 mg/dL (ref 8–23)
CO2: 22 mmol/L (ref 22–32)
Calcium: 8.2 mg/dL — ABNORMAL LOW (ref 8.9–10.3)
Chloride: 94 mmol/L — ABNORMAL LOW (ref 98–111)
Creatinine, Ser: 0.73 mg/dL (ref 0.44–1.00)
GFR, Estimated: >60 mL/min (ref >60)
Glucose, Bld: 105 mg/dL — ABNORMAL HIGH (ref 70–99)
Potassium: 3.8 mmol/L (ref 3.5–5.1)
Sodium: 129 mmol/L — ABNORMAL LOW (ref 135–145)

## 2024-02-11 LAB — URINALYSIS, ROUTINE W REFLEX MICROSCOPIC
Bilirubin Urine: NEGATIVE
Glucose, UA: NEGATIVE mg/dL
Hgb urine dipstick: NEGATIVE
Ketones, ur: NEGATIVE mg/dL
Leukocytes,Ua: NEGATIVE
Nitrite: NEGATIVE
Protein, ur: NEGATIVE mg/dL
Specific Gravity, Urine: 1.02 (ref 1.005–1.030)
pH: 6 (ref 5.0–8.0)

## 2024-02-11 LAB — CBC
HCT: 37.4 % (ref 36.0–46.0)
Hemoglobin: 13 g/dL (ref 12.0–15.0)
MCH: 32 pg (ref 26.0–34.0)
MCHC: 34.8 g/dL (ref 30.0–36.0)
MCV: 92.1 fL (ref 80.0–100.0)
Platelets: 240 K/uL (ref 150–400)
RBC: 4.06 MIL/uL (ref 3.87–5.11)
RDW: 12.3 % (ref 11.5–15.5)
WBC: 8.9 K/uL (ref 4.0–10.5)
nRBC: 0 % (ref 0.0–0.2)

## 2024-02-11 LAB — HEPATIC FUNCTION PANEL
ALT: 612 U/L — ABNORMAL HIGH (ref 0–44)
AST: 1435 U/L — ABNORMAL HIGH (ref 15–41)
Albumin: 3.7 g/dL (ref 3.5–5.0)
Alkaline Phosphatase: 376 U/L — ABNORMAL HIGH (ref 38–126)
Bilirubin, Direct: 0.5 mg/dL — ABNORMAL HIGH (ref 0.0–0.2)
Indirect Bilirubin: 0.3 mg/dL (ref 0.3–0.9)
Total Bilirubin: 0.8 mg/dL (ref 0.0–1.2)
Total Protein: 6.8 g/dL (ref 6.5–8.1)

## 2024-02-11 LAB — TROPONIN T, HIGH SENSITIVITY
Troponin T High Sensitivity: <15 ng/L (ref 0–19)
Troponin T High Sensitivity: <15 ng/L (ref 0–19)

## 2024-02-11 LAB — LIPASE, BLOOD: Lipase: 66 U/L — ABNORMAL HIGH (ref 11–51)

## 2024-02-11 MED ORDER — SODIUM CHLORIDE 0.9 % IV BOLUS
1000.0000 mL | Freq: Once | INTRAVENOUS | Status: AC
  Administered 2024-02-11: 1000 mL via INTRAVENOUS

## 2024-02-11 MED ORDER — IOHEXOL 350 MG/ML SOLN
100.0000 mL | Freq: Once | INTRAVENOUS | Status: AC | PRN
  Administered 2024-02-11: 100 mL via INTRAVENOUS

## 2024-02-11 NOTE — ED Triage Notes (Signed)
 Pt comes via EMS from home with c/o cp. Pt states this started today. Pt states lower belly pain. Pt was dx with pneumonia and has finished all meds. Pt has had dry cough. Pt states no radiation with cp. Hx of afib.

## 2024-02-11 NOTE — ED Triage Notes (Addendum)
 Pt to ED via [facility] from home. Pt reports centralized CP that started today. Pt reports some SOB. Pt is on blood thinner for afib. Pt reports finished last dose of antibiotics today for PNA.

## 2024-02-11 NOTE — ED Provider Notes (Signed)
 [facility] Provider Note    Event Date/Time   First MD Initiated Contact with Patient 02/11/24 1942     (approximate)   History   Chest Pain   HPI  Judith Wood is a 68 y.o. female with a history of atrial fibrillation and hypertension who presents with abdominal and chest pain.  The patient states that she was treated for pneumonia starting last week.  She is finishing the antibiotic.  She states that over the last several days she has developed abdominal pain along the midline of her abdomen ranging from the upper abdomen down to the lower abdomen.  It is intermittent.  She denies any associated nausea or vomiting.  She has had several episodes of watery diarrhea.  There is no blood in the stool.  However this evening she started having chest pain that lasted about 30 minutes and has now resolved.  She denies any shortness of breath.  She has no fever or chills.  I reviewed the past medical records.  [facility] reporting body aches, cough, and fever.  COVID and influenza were negative.  Chest x-ray showed a possible hazy opacity so she was started on Levaquin  for pneumonia.   Physical Exam   Triage Vital Signs: ED Triage Vitals  Encounter Vitals Group     BP 02/11/24 1737 129/73     Girls Systolic BP Percentile --      Girls Diastolic BP Percentile --      Boys Systolic BP Percentile --      Boys Diastolic BP Percentile --      Pulse Rate 02/11/24 1735 69     Resp 02/11/24 1735 18     Temp 02/11/24 1735 98 F (36.7 C)     Temp Source 02/11/24 1735 Oral     SpO2 02/11/24 1735 95 %     Weight --      Height --      Head Circumference --      Peak Flow --      Pain Score 02/11/24 1735 8     Pain Loc --      Pain Education --      Exclude from Growth Chart --     Most recent vital signs: Vitals:   02/11/24 1737 02/11/24 2241  BP: 129/73 (!) 141/71  Pulse:  63  Resp:  18  Temp:  98.6 F (37 C)  SpO2:  100%      General: Alert, relatively well-appearing, no distress.  CV:  Good peripheral perfusion.   Resp:  Normal effort. Lungs CTAB. Abd:  Soft with mild left lower quadrant tenderness.  No distention.  Other:  No jaundice or scleral icterus.  No peripheral edema.   ED Results / Procedures / Treatments   Labs (all labs ordered are listed, but only abnormal results are displayed) Labs Reviewed  BASIC METABOLIC PANEL WITH GFR - Abnormal; Notable for the following components:      Result Value   Sodium 129 (*)    Chloride 94 (*)    Glucose, Bld 105 (*)    Calcium 8.2 (*)    All other components within normal limits  HEPATIC FUNCTION PANEL - Abnormal; Notable for the following components:   AST 1,435 (*)    ALT 612 (*)    Alkaline Phosphatase 376 (*)    Bilirubin, Direct 0.5 (*)    All other components within normal limits  LIPASE, BLOOD - Abnormal; Notable for the following components:   Lipase 66 (*)    All other components within normal limits  URINALYSIS, ROUTINE W REFLEX MICROSCOPIC - Abnormal; Notable for the following components:   Color, Urine YELLOW (*)    APPearance CLEAR (*)    All other components within normal limits  CBC  HEPATITIS PANEL, ACUTE  TROPONIN T, HIGH SENSITIVITY  TROPONIN T, HIGH SENSITIVITY     EKG  ED ECG REPORT I, Waylon Cassis, the attending physician, personally viewed and interpreted this ECG.  Date: 02/11/2024 EKG Time: 1741 Rate: 90 Rhythm: normal sinus rhythm QRS Axis: normal Intervals: normal ST/T Wave abnormalities: Nonspecific T wave abnormality Narrative Interpretation: no evidence of acute ischemia    RADIOLOGY  Chest x-ray: I independently viewed and interpreted the images; there is no focal consolidation or edema  CT abdomen/pelvis: No acute abnormality  PROCEDURES:  Critical Care performed: No  Procedures   MEDICATIONS ORDERED IN ED: Medications  sodium chloride  0.9 % bolus 1,000 mL (1,000 mLs  Intravenous New Bag/Given 02/11/24 2109)  iohexol  (OMNIPAQUE ) 350 MG/ML injection 100 mL (100 mLs Intravenous Contrast Given 02/11/24 2116)     IMPRESSION / MDM / ASSESSMENT AND PLAN / ED COURSE  I reviewed the triage vital signs and the nursing notes.  68 year old female with PMH as noted above presents with primarily abdominal pain over the last several days after being started on Levaquin  for pneumonia.  However, this evening she also started having chest pain which has now resolved.  On exam her vital signs are normal.  She is overall relatively well-appearing.  Physical exam is notable for some left lower quadrant tenderness.  Differential diagnosis includes, but is not limited to, colitis, diverticulitis, gastritis, antibiotic side effect, less likely C. difficile infection.  Differential for the chest pain includes GERD, musculoskeletal pain, less likely ACS.  BMP, CBC, and troponin were ordered from triage and are all within normal limits except for mild hyponatremia.  Chest x-ray is clear.  EKG is nonischemic.  I have ordered fluids, additional labs, urinalysis, and a CT abdomen for further evaluation.    Patient's presentation is most consistent with acute complicated illness / injury requiring diagnostic workup.  ----------------------------------------- 11:43 PM on 02/11/2024 -----------------------------------------  LFTs show significant transaminitis and elevated alkaline phosphatase although the bilirubin is not elevated.  Lipase is minimally elevated.  This could be related to the Levaquin .  The patient is status post cholecystectomy.  I doubt choledocholithiasis or other obstructive process.  However, given this finding, the patient will benefit from admission for further workup and monitoring.  I have ordered a right upper quadrant ultrasound and a hepatitis panel.  I consulted Dr. Cleatus from the hospitalist service; based on our discussion she agrees to evaluate the patient for  admission.  FINAL CLINICAL IMPRESSION(S) / ED DIAGNOSES   Final diagnoses:  Abdominal pain, unspecified abdominal location  Elevated LFTs     Rx / DC Orders   ED Discharge Orders     None        Note:  This document was prepared using Dragon voice recognition software and may include unintentional dictation errors.    Cassis Waylon, MD 02/11/24 [phone]

## 2024-02-12 DIAGNOSIS — E871 Hypo-osmolality and hyponatremia: Secondary | ICD-10-CM

## 2024-02-12 DIAGNOSIS — B179 Acute viral hepatitis, unspecified: Principal | ICD-10-CM

## 2024-02-12 LAB — COMPREHENSIVE METABOLIC PANEL WITH GFR
ALT: 492 U/L — ABNORMAL HIGH (ref 0–44)
AST: 609 U/L — ABNORMAL HIGH (ref 15–41)
Albumin: 3.4 g/dL — ABNORMAL LOW (ref 3.5–5.0)
Alkaline Phosphatase: 321 U/L — ABNORMAL HIGH (ref 38–126)
Anion gap: 8 (ref 5–15)
BUN: 11 mg/dL (ref 8–23)
CO2: 25 mmol/L (ref 22–32)
Calcium: 8.7 mg/dL — ABNORMAL LOW (ref 8.9–10.3)
Chloride: 100 mmol/L (ref 98–111)
Creatinine, Ser: 0.61 mg/dL (ref 0.44–1.00)
GFR, Estimated: >60 mL/min (ref >60)
Glucose, Bld: 98 mg/dL (ref 70–99)
Potassium: 4 mmol/L (ref 3.5–5.1)
Sodium: 134 mmol/L — ABNORMAL LOW (ref 135–145)
Total Bilirubin: 0.5 mg/dL (ref 0.0–1.2)
Total Protein: 6.5 g/dL (ref 6.5–8.1)

## 2024-02-12 LAB — CBC
HCT: 34.6 % — ABNORMAL LOW (ref 36.0–46.0)
Hemoglobin: 11.9 g/dL — ABNORMAL LOW (ref 12.0–15.0)
MCH: 31.5 pg (ref 26.0–34.0)
MCHC: 34.4 g/dL (ref 30.0–36.0)
MCV: 91.5 fL (ref 80.0–100.0)
Platelets: 228 K/uL (ref 150–400)
RBC: 3.78 MIL/uL — ABNORMAL LOW (ref 3.87–5.11)
RDW: 12.2 % (ref 11.5–15.5)
WBC: 4.4 K/uL (ref 4.0–10.5)
nRBC: 0 % (ref 0.0–0.2)

## 2024-02-12 LAB — ACETAMINOPHEN LEVEL: Acetaminophen (Tylenol), Serum: <10 ug/mL — ABNORMAL LOW (ref 10–30)

## 2024-02-12 LAB — HEPATITIS PANEL, ACUTE
HCV Ab: NONREACTIVE
Hep A IgM: NONREACTIVE
Hep B C IgM: NONREACTIVE
Hepatitis B Surface Ag: NONREACTIVE

## 2024-02-12 LAB — HEPATIC FUNCTION PANEL
ALT: 547 U/L — ABNORMAL HIGH (ref 0–44)
AST: 880 U/L — ABNORMAL HIGH (ref 15–41)
Albumin: 3.5 g/dL (ref 3.5–5.0)
Alkaline Phosphatase: 341 U/L — ABNORMAL HIGH (ref 38–126)
Bilirubin, Direct: 0.2 mg/dL (ref 0.0–0.2)
Indirect Bilirubin: 0.3 mg/dL (ref 0.3–0.9)
Total Bilirubin: 0.5 mg/dL (ref 0.0–1.2)
Total Protein: 6.5 g/dL (ref 6.5–8.1)

## 2024-02-12 LAB — PROTIME-INR
INR: 1.1 (ref 0.8–1.2)
Prothrombin Time: 14.6 s (ref 11.4–15.2)

## 2024-02-12 LAB — HIV ANTIBODY (ROUTINE TESTING W REFLEX): HIV Screen 4th Generation wRfx: NONREACTIVE

## 2024-02-12 LAB — CK: Total CK: 54 U/L (ref 38–234)

## 2024-02-12 MED ORDER — IBUPROFEN 400 MG PO TABS: 400.0000 mg | ORAL_TABLET | Freq: Four times a day (QID) | ORAL | Status: DC | PRN

## 2024-02-12 MED ORDER — MORPHINE SULFATE (PF) 2 MG/ML IV SOLN: 2.0000 mg | INTRAVENOUS | Status: DC | PRN

## 2024-02-12 MED ORDER — OXYCODONE HCL 5 MG PO TABS: 5.0000 mg | ORAL_TABLET | ORAL | Status: DC | PRN

## 2024-02-12 MED ORDER — METOPROLOL SUCCINATE ER 50 MG PO TB24: 50.0000 mg | ORAL_TABLET | Freq: Every day | ORAL | Status: DC

## 2024-02-12 MED ORDER — ONDANSETRON HCL 4 MG/2ML IJ SOLN: 4.0000 mg | Freq: Four times a day (QID) | INTRAMUSCULAR | Status: DC | PRN

## 2024-02-12 MED ORDER — ONDANSETRON HCL 4 MG PO TABS: 4.0000 mg | ORAL_TABLET | Freq: Four times a day (QID) | ORAL | Status: DC | PRN

## 2024-02-12 MED ORDER — LACTATED RINGERS IV SOLN: INTRAVENOUS | Status: AC

## 2024-02-12 MED ORDER — FLECAINIDE ACETATE 100 MG PO TABS
100.0000 mg | ORAL_TABLET | Freq: Two times a day (BID) | ORAL | Status: DC
  Administered 2024-02-12: 100 mg via ORAL
  Filled 2024-02-12 (×2): qty 1

## 2024-02-12 NOTE — Hospital Course (Signed)
 Judith Wood

## 2024-02-12 NOTE — TOC CM/SW Note (Signed)
 Transition of Care [facility]) - Inpatient Brief Assessment   Patient Details  Name: Raylan Troiani MRN: [id number] Date of Birth: November 02, 1955  Transition of Care [facility]) CM/SW Contact:    Nathanael CHRISTELLA Ring, RN Phone Number: 02/12/2024, 10:41 AM   Clinical Narrative:   Transition of Care [facility]) Screening Note   Patient Details  Name: Adaysha Dubinsky Date of Birth: 04-Mar-1956   Transition of Care [facility]) CM/SW Contact:    Nathanael CHRISTELLA Ring, RN Phone Number: 02/12/2024, 10:41 AM    Transition of Care Department [facility]) has reviewed patient and no TOC needs have been identified at this time. If new patient transition needs arise, please place a TOC consult.    Transition of Care Asessment: Insurance and Status: Insurance coverage has been reviewed Patient has primary care physician: Yes Home environment has been reviewed: home with husband Prior level of function:: independent Prior/Current Home Services: No current home services Social Drivers of Health Review: SDOH reviewed no interventions necessary Readmission risk has been reviewed: No (na) Transition of care needs: no transition of care needs at this time

## 2024-02-12 NOTE — ED Notes (Addendum)
 This RN contacted poison control  per Dr. Cleatus request to inquire if NAC was a viable option for patient elevated liver enzymes s/p Levaquin  therapy. Per Pharmacist Noelle, repeat liver functions, add a CK level, and repeat EKG. At this time NAC is not recommended, pharmacist thinks it may be related rhabdo, and reach out to poison if LFT has increased. Dr. Cleatus made aware via phone call. New orders placed.

## 2024-02-12 NOTE — Assessment & Plan Note (Addendum)
 Continue Eliquis  Holding flecainide  tonight pending GI consult and recommendations Continue diltiazem  and metoprolol  once verified

## 2024-02-12 NOTE — Consult Note (Signed)
 Rogelia Copping, MD [facility]  [address], [address] [place], [place] [address] Phone: [phone] Fax : [phone]  Consultation  Referring Provider:     Dr. Cleatus Primary Care Physician:  Edman Marsa PARAS, DO Primary Gastroenterologist: [facility]         Reason for Consultation:     Elevated liver enzymes  Date of Admission:  02/11/2024 Date of Consultation:  02/12/2024         HPI:   Judith Wood is a 68 y.o. female who has a history of longstanding abnormal liver enzymes with slightly elevated liver enzymes in the past with a admission for abdominal pain.  The patient states that her abdominal pain was in the lower abdomen.  The patient states that her husband was also sick but he got better.  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  The patient also has A-fib and is on Eliquis  and flecainide .  She states that she was given Levaquin  recently and was feeling so poorly yesterday that she came in for feeling bad whereupon she was found to have abnormal liver enzymes.  The patient also reports that she had watery nonbilious nonbloody diarrhea.  There is no report of any nausea or vomiting.  She was given antibiotics for pneumonia symptoms after being seen by urgent care.  The patient's liver enzymes have shown:  Component     Latest Ref Rng 10/02/2022 03/23/2023 02/11/2024 02/12/2024  ALT     0 - 44 U/L 70 (H)  42 (H)  612 (H)  492 (H)   ALT         547 (H)   AST     15 - 41 U/L 49 (H)  35  1,435 (H)  609 (H)   AST         880 (H)   Total Protein     6.5 - 8.1 g/dL 7.4  6.7  6.8  6.5   Total Protein         6.5   Total Bilirubin     0.0 - 1.2 mg/dL 0.4  0.4  0.8  0.5   Total Bilirubin         0.5    The patient reports that she is feeling much better today without any abdominal pain and states that she is ready to go home.  Past Medical History:  Diagnosis Date   Abnormal mammogram of right breast 01/18/2016   Afib (HCC)     Dysrhythmia    Hypertension     Past Surgical History:  Procedure Laterality Date   APPENDECTOMY  1974   CHOLECYSTECTOMY  2005   COLONOSCOPY WITH PROPOFOL  N/A 09/29/2021   Procedure: COLONOSCOPY WITH PROPOFOL ;  Surgeon: Therisa Bi, MD;  Location: [facility] ENDOSCOPY;  Service: Gastroenterology;  Laterality: N/A;   ROUX-EN-Y GASTRIC BYPASS  2009   TOTAL ABDOMINAL HYSTERECTOMY  2000   WRIST SURGERY Right 2017    Prior to Admission medications   Medication Sig Start Date End Date Taking? Authorizing Provider  acetaminophen  (TYLENOL ) 500 MG tablet Take 1,000 mg by mouth every 8 (eight) hours as needed for headache or mild pain (pain score 1-3).   Yes [provider]  apixaban  (ELIQUIS ) 5 MG TABS tablet Take 1 tablet by mouth twice daily 08/27/23  Yes Gollan, Timothy J, MD  b complex vitamins tablet Take 1 tablet by mouth daily.   Yes [provider]  Calcium Citrate-Vitamin D  (CALCIUM CITRATE + D PO) Take 2 tablets by mouth daily.   Yes [provider]  CHOLECALCIFEROL PO Take 5,000 Units by mouth daily.   Yes [provider]  D-MANNOSE PO Take 1 capsule by mouth daily.   Yes [provider]  diltiazem  (DILT-XR) 180 MG 24 hr capsule Take 1 capsule (180 mg total) by mouth daily. 07/16/23  Yes Justus Leita DEL, MD  diphenhydrAMINE (BENADRYL) 50 MG capsule Take 50 mg by mouth at bedtime as needed for sleep.   Yes [provider]  Fe Bisgly-Vit C-Vit B12-FA (GENTLE IRON PO) Take 25 mg by mouth in the morning and at bedtime.   Yes [provider]  flecainide  (TAMBOCOR ) 100 MG tablet Take 1 tablet by mouth twice daily 05/04/23  Yes Gollan, Timothy J, MD  glucosamine-chondroitin 500-400 MG tablet Take 2 tablets by mouth daily with supper.   Yes [provider]  magnesium 30 MG tablet Take 30 mg by mouth daily.   Yes [provider]  metoprolol  succinate (TOPROL -XL) 50 MG 24 hr tablet TAKE 1 TABLET BY MOUTH ONCE DAILY WITH OR  IMMEDIATLY FOLLOWING A MEAL 05/04/23  Yes Gollan, Timothy J, MD  levofloxacin  (LEVAQUIN ) 750 MG tablet Take 750 mg by mouth daily. Patient not taking: Reported on 02/12/2024 02/07/24 02/12/24  [provider]    Family History  Problem Relation Age of Onset   Hypertension Mother    Arrhythmia Mother        Pacemaker implant    Heart attack Father 63   Arrhythmia Sister    Arrhythmia Brother    Arrhythmia Brother    Breast cancer Neg Hx      Social History   Tobacco Use   Smoking status: Former    Types: Cigarettes    Passive exposure: Never   Smokeless tobacco: Never  Vaping Use   Vaping status: Never Used  Substance Use Topics   Alcohol use: No   Drug use: No    Allergies as of 02/11/2024 - Review Complete 02/11/2024  Allergen Reaction Noted   Norco [hydrocodone-acetaminophen ] Itching 09/19/2015   Tetracyclines & related Itching and Rash 09/19/2015    Review of Systems:    All systems reviewed and negative except where noted in HPI.   Physical Exam:  Vital signs in last 24 hours: Temp:  [97.6 F (36.4 C)-98.6 F (37 C)] 98 F (36.7 C) (12/09 1147) Pulse Rate:  [58-71] 63 (12/09 1445) Resp:  [12-21] 17 (12/09 1445) BP: (113-141)/(69-81) 113/70 (12/09 1445) SpO2:  [95 %-100 %] 97 % (12/09 1445) Last BM Date : 02/12/24 General:   Pleasant, cooperative in NAD Head:  Normocephalic and atraumatic. Eyes:   No icterus.   Conjunctiva pink. PERRLA. Ears:  Normal auditory acuity. Abdomen:  Soft, nondistended, nontender. Normal bowel sounds. No appreciable masses or hepatomegaly.  No rebound or guarding.  Rectal:  Not performed. Msk:  Symmetrical without gross deformities.    Skin:  Intact without significant lesions or rashes. Psych:  Alert and cooperative. Normal affect.  LAB RESULTS: Recent Labs    02/11/24 1737 02/12/24 0521  WBC 8.9 4.4  HGB 13.0 11.9*  HCT 37.4 34.6*  PLT 240 228   BMET Recent Labs    02/11/24 1737 02/12/24 0521  NA 129*  134*  K 3.8 4.0  CL 94* 100  CO2 22 25  GLUCOSE 105* 98  BUN 17 11  CREATININE 0.73 0.61  CALCIUM 8.2* 8.7*  LFT Recent Labs    02/12/24 0114 02/12/24 0521  PROT 6.5 6.5  ALBUMIN 3.5 3.4*  AST 880* 609*  ALT 547* 492*  ALKPHOS 341* 321*  BILITOT 0.5 0.5  BILIDIR 0.2  --   IBILI 0.3  --    PT/INR Recent Labs    02/12/24 0114  LABPROT 14.6  INR 1.1    STUDIES: US  Abdomen Limited RUQ (LIVER/GB) Result Date: 02/12/2024 EXAM: Right Upper Quadrant Abdominal Ultrasound TECHNIQUE: Real-time ultrasonography of the right upper quadrant of the abdomen was performed. COMPARISON: CT of the abdomen and pelvis with IV contrast from 07/20/2023 and from today. CLINICAL HISTORY: Elevated LFTs. FINDINGS: LIVER: The liver demonstrates normal echogenicity. Mild intrahepatic biliary dilation prominence is noted, which was seen on the 07/20/2023 CT and is unchanged. This is probably simply related to the prior cholecystectomy. No mass. The hepatic portal vein caliber and directional flow are normal. BILIARY SYSTEM: The gallbladder is surgically absent. The common bile duct is prominent, measuring 7.6 mm. This measurement is unchanged since 07/20/2023. This is probably simply related to the prior cholecystectomy. OTHER: No right upper quadrant ascites. IMPRESSION: 1. Surgically absent gallbladder. 2. Prominent common bile duct measuring 7.6 mm with mild intrahepatic biliary dilation, unchanged from prior CT on 07/20/2023, likely related to prior cholecystectomy. If ductal obstruction is suspected , MRCP may be helpful for further study. Electronically signed by: Francis Quam MD 02/12/2024 12:05 AM EST RP Workstation: [id number]   CT ABDOMEN PELVIS W CONTRAST Result Date: 02/11/2024 EXAM: CT ABDOMEN AND PELVIS WITH CONTRAST 02/11/2024 09:26:08 PM TECHNIQUE: CT of the abdomen and pelvis was performed with the administration of 100 mL of iohexol  (OMNIPAQUE ) 350 MG/ML injection. Multiplanar reformatted  images are provided for review. Automated exposure control, iterative reconstruction, and/or weight-based adjustment of the mA/kV was utilized to reduce the radiation dose to as low as reasonably achievable. COMPARISON: 07/20/2023 CLINICAL HISTORY: LLQ abdominal pain. FINDINGS: LOWER CHEST: Small hiatal hernia. LIVER: The liver is unremarkable. GALLBLADDER AND BILE DUCTS: Prior cholecystectomy. No biliary ductal dilatation. SPLEEN: No acute abnormality. PANCREAS: No acute abnormality. ADRENAL GLANDS: No acute abnormality. KIDNEYS, URETERS AND BLADDER: No stones in the kidneys or ureters. No hydronephrosis. No perinephric or periureteral stranding. Urinary bladder is unremarkable. GI AND BOWEL: Stomach demonstrates no acute abnormality. There is no bowel obstruction. PERITONEUM AND RETROPERITONEUM: No ascites. No free air. VASCULATURE: Aorta is normal in caliber. Aortic atherosclerosis. LYMPH NODES: No lymphadenopathy. REPRODUCTIVE ORGANS: No acute abnormality. BONES AND SOFT TISSUES: No acute osseous abnormality. Infraumbilical ventral hernia contains fat. IMPRESSION: 1. No acute findings in the abdomen or pelvis. 2. Infraumbilical ventral hernia containing fat without bowel involvement. Electronically signed by: Franky Crease MD 02/11/2024 09:30 PM EST RP Workstation: [id number]   DG Chest 2 View Result Date: 02/11/2024 EXAM: 2 VIEW(S) XRAY OF THE CHEST 02/11/2024 06:04:52 PM COMPARISON: Comparison to 04/11/2022. CLINICAL HISTORY: Chest pain. FINDINGS: LUNGS AND PLEURA: No focal pulmonary opacity. No pleural effusion. No pneumothorax. HEART AND MEDIASTINUM: No acute abnormality of the cardiac and mediastinal silhouettes. BONES AND SOFT TISSUES: No acute osseous abnormality. IMPRESSION: 1. No acute cardiopulmonary process to explain chest pain. Electronically signed by: Franky Crease MD 02/11/2024 06:19 PM EST RP Workstation: [id number]      Impression / Plan:   Assessment: Principal Problem:   Acute  hepatitis Active Problems:   Paroxysmal atrial fibrillation (HCC)   Benign essential HTN   Obesity, Class III, BMI 40-49.9 (morbid obesity) (HCC)   Hyponatremia   Judith Wood is  a 68 y.o. y/o female with significant elevated liver enzymes with dramatic improvement over the last 24 hours.  The patient has been told that liver enzymes over thousand are likely caused by ischemia, viruses or medications.  Since she has no history of hypertension it is unlikely caused by ischemia.  Her acute hepatitis panel was negative.  The patient's likely cause was a drug interaction versus a self-limiting viral infection.  Plan:  Due to the patient's rapid improvement and feeling well the patient has been told that there is no need for her to stay at the hospital while these liver enzymes are dramatically improving.  She has been told that since she is tolerating p.o.'s and doing well that she can follow-up with her primary care doctor and should have her liver enzymes checked in 5 to 7 days to make sure they are coming down.  The patient also has an appointment with [facility] for revision of her gastric bypass since she has not achieved the desired weight she would like to be.  The patient has been explained the plan and agrees with it.  Nothing further to do from GI point of view at this time unless the liver enzymes remain high after her follow-up with her primary care provider.  Thank you for involving me in the care of this patient.      LOS: 0 days   Rogelia Copping, MD, MD. NOLIA 02/12/2024, 4:37 PM,  Pager [phone] 7am-5pm  Check AMION for 5pm -7am coverage and on weekends   Note: This dictation was prepared with Dragon dictation along with smaller phrase technology. Any transcriptional errors that result from this process are unintentional.

## 2024-02-12 NOTE — Assessment & Plan Note (Signed)
 Serum sodium 129, uncertain etiology Monitor post fluid bolus

## 2024-02-12 NOTE — Discharge Summary (Addendum)
 Physician Discharge Summary   Patient: Judith Wood MRN: [id number] DOB: 03/02/1956  Admit date:     02/11/2024  Discharge date: 02/12/24  Discharge Physician: Deliliah Room   PCP: Edman Marsa PARAS, DO   Recommendations at discharge:    F/u with your PCP in one week. Liver enzymes test in one week  Discharge Diagnoses: Principal Problem:   Acute hepatitis Active Problems:   Paroxysmal atrial fibrillation (HCC)   Hyponatremia   Benign essential HTN   Obesity, Class III, BMI 40-49.9 (morbid obesity) [facility])   Hospital Course:  68 y.o. female with medical history significant for Paroxysmal A-fib on flecainide  and Eliquis , hypertension , morbid obesity with prior Roux-en-Y surgery, prior cholecystectomy, recent pneumonia (completed Levaquin  on 12/7), admitted with suspected hepatotoxicity related to Levaquin  versus acute hepatitis.  Patient presented with a 5-day history of intermittent abdominal pain associated with several episodes of watery nonbloody nonbilious diarrhea; In the ED vitals completely withinLimits.  Labs however notable for abnormal LFTs with AST 1435, ALT 612, alk phos 376 with essentially normal bilirubin (TB 0.8, DB 0.5, indirect bili 0.3).  LFTs were normal 10 months ago. Labs otherwise notable for lipase 66, hyponatremia of 129 and normal CBC.  Urinalysis completely normal.  Hepatitis panel pending. EKG showed NSR at 70 Chest x-ray was unremarkable. CT abdomen and pelvis with contrast showed no acute findings in the abdomen or pelvis Right upper quadrant ultrasound was unremarkable Acute hepatitis panel was negative. GI consulted Liver enzymes improved significantly with IV Hydration.  She was discharged in stable condition with a plan to follow up with her PCP in one week and have liver enzymes checked. Spoke to GI, Dr Jinny on the discharge day.        Consultants: GI Procedures performed: none  Disposition: Home Diet recommendation:  Cardiac  diet DISCHARGE MEDICATION: Allergies as of 02/12/2024       Reactions   Azithromycin  Other (See Comments)   Cannot take due to Afib   Hydrocodone-acetaminophen  Itching, Other (See Comments)   Norco [hydrocodone-acetaminophen ] Itching   Tetracyclines & Related Itching, Rash        Medication List     STOP taking these medications    acetaminophen  500 MG tablet Commonly known as: TYLENOL    levofloxacin  750 MG tablet Commonly known as: LEVAQUIN        TAKE these medications    b complex vitamins tablet Take 1 tablet by mouth daily.   CALCIUM CITRATE + D PO Take 2 tablets by mouth daily.   CHOLECALCIFEROL PO Take 5,000 Units by mouth daily.   D-MANNOSE PO Take 1 capsule by mouth daily.   diltiazem  180 MG 24 hr capsule Commonly known as: Dilt-XR Take 1 capsule (180 mg total) by mouth daily.   diphenhydrAMINE 50 MG capsule Commonly known as: BENADRYL Take 50 mg by mouth at bedtime as needed for sleep.   Eliquis  5 MG Tabs tablet Generic drug: apixaban  Take 1 tablet by mouth twice daily   flecainide  100 MG tablet Commonly known as: TAMBOCOR  Take 1 tablet by mouth twice daily   GENTLE IRON PO Take 25 mg by mouth in the morning and at bedtime.   glucosamine-chondroitin 500-400 MG tablet Take 2 tablets by mouth daily with supper.   magnesium 30 MG tablet Take 30 mg by mouth daily.   metoprolol  succinate 50 MG 24 hr tablet Commonly known as: TOPROL -XL TAKE 1 TABLET BY MOUTH ONCE DAILY WITH OR IMMEDIATLY FOLLOWING A MEAL  Follow-up Information     Edman Marsa PARAS, DO. Schedule an appointment as soon as possible for a visit in 1 week(s).   Specialty: Family Medicine Why: needs liver function tests in a week Contact information: [address] [place] [address] [phone]                Discharge Exam: There were no vitals filed for this visit. Constitutional: NAD, calm, comfortable Eyes: PERRL, lids and conjunctivae  normal ENMT: Mucous membranes are moist. Posterior pharynx clear of any exudate or lesions.Normal dentition.  Neck: normal, supple, no masses, no thyromegaly Respiratory: clear to auscultation bilaterally, no wheezing, no crackles. Normal respiratory effort. No accessory muscle use.  Cardiovascular: Regular rate and rhythm, no murmurs / rubs / gallops. No extremity edema. 2+ pedal pulses. No carotid bruits.  Abdomen: no tenderness, no masses palpated. No hepatosplenomegaly. Bowel sounds positive.  Musculoskeletal: no clubbing / cyanosis. No joint deformity upper and lower extremities. Good ROM, no contractures. Normal muscle tone.  Skin: no rashes, lesions, ulcers. No induration Neurologic: CN 2-12 grossly intact. Sensation intact, DTR normal. Strength 5/5 x all 4 extremities.  Psychiatric: Normal judgment and insight. Alert and oriented x 3. Normal mood.    Condition at discharge: good  The results of significant diagnostics from this hospitalization (including imaging, microbiology, ancillary and laboratory) are listed below for reference.   Imaging Studies: US  Abdomen Limited RUQ (LIVER/GB) Result Date: 02/12/2024 EXAM: Right Upper Quadrant Abdominal Ultrasound TECHNIQUE: Real-time ultrasonography of the right upper quadrant of the abdomen was performed. COMPARISON: CT of the abdomen and pelvis with IV contrast from 07/20/2023 and from today. CLINICAL HISTORY: Elevated LFTs. FINDINGS: LIVER: The liver demonstrates normal echogenicity. Mild intrahepatic biliary dilation prominence is noted, which was seen on the 07/20/2023 CT and is unchanged. This is probably simply related to the prior cholecystectomy. No mass. The hepatic portal vein caliber and directional flow are normal. BILIARY SYSTEM: The gallbladder is surgically absent. The common bile duct is prominent, measuring 7.6 mm. This measurement is unchanged since 07/20/2023. This is probably simply related to the prior cholecystectomy. OTHER:  No right upper quadrant ascites. IMPRESSION: 1. Surgically absent gallbladder. 2. Prominent common bile duct measuring 7.6 mm with mild intrahepatic biliary dilation, unchanged from prior CT on 07/20/2023, likely related to prior cholecystectomy. If ductal obstruction is suspected , MRCP may be helpful for further study. Electronically signed by: Francis Quam MD 02/12/2024 12:05 AM EST RP Workstation: [id number]   CT ABDOMEN PELVIS W CONTRAST Result Date: 02/11/2024 EXAM: CT ABDOMEN AND PELVIS WITH CONTRAST 02/11/2024 09:26:08 PM TECHNIQUE: CT of the abdomen and pelvis was performed with the administration of 100 mL of iohexol  (OMNIPAQUE ) 350 MG/ML injection. Multiplanar reformatted images are provided for review. Automated exposure control, iterative reconstruction, and/or weight-based adjustment of the mA/kV was utilized to reduce the radiation dose to as low as reasonably achievable. COMPARISON: 07/20/2023 CLINICAL HISTORY: LLQ abdominal pain. FINDINGS: LOWER CHEST: Small hiatal hernia. LIVER: The liver is unremarkable. GALLBLADDER AND BILE DUCTS: Prior cholecystectomy. No biliary ductal dilatation. SPLEEN: No acute abnormality. PANCREAS: No acute abnormality. ADRENAL GLANDS: No acute abnormality. KIDNEYS, URETERS AND BLADDER: No stones in the kidneys or ureters. No hydronephrosis. No perinephric or periureteral stranding. Urinary bladder is unremarkable. GI AND BOWEL: Stomach demonstrates no acute abnormality. There is no bowel obstruction. PERITONEUM AND RETROPERITONEUM: No ascites. No free air. VASCULATURE: Aorta is normal in caliber. Aortic atherosclerosis. LYMPH NODES: No lymphadenopathy. REPRODUCTIVE ORGANS: No acute abnormality. BONES AND SOFT  TISSUES: No acute osseous abnormality. Infraumbilical ventral hernia contains fat. IMPRESSION: 1. No acute findings in the abdomen or pelvis. 2. Infraumbilical ventral hernia containing fat without bowel involvement. Electronically signed by: Franky Crease MD  02/11/2024 09:30 PM EST RP Workstation: [id number]   DG Chest 2 View Result Date: 02/11/2024 EXAM: 2 VIEW(S) XRAY OF THE CHEST 02/11/2024 06:04:52 PM COMPARISON: Comparison to 04/11/2022. CLINICAL HISTORY: Chest pain. FINDINGS: LUNGS AND PLEURA: No focal pulmonary opacity. No pleural effusion. No pneumothorax. HEART AND MEDIASTINUM: No acute abnormality of the cardiac and mediastinal silhouettes. BONES AND SOFT TISSUES: No acute osseous abnormality. IMPRESSION: 1. No acute cardiopulmonary process to explain chest pain. Electronically signed by: Franky Crease MD 02/11/2024 06:19 PM EST RP Workstation: [id number]    Microbiology: Results for orders placed or performed during the hospital encounter of 09/10/23  Urine Culture     Status: Abnormal   Collection Time: 09/10/23 11:24 AM   Specimen: Urine, Clean Catch  Result Value Ref Range Status   Specimen Description URINE, CLEAN CATCH  Final   Special Requests   Final    NONE Performed at [facility], [address], [place], [place] [address]    Culture 30,000 COLONIES/mL ESCHERICHIA COLI (A)  Final   Report Status 09/12/2023 FINAL  Final   Organism ID, Bacteria ESCHERICHIA COLI (A)  Final      Susceptibility   Escherichia coli - MIC*    AMPICILLIN 8 SENSITIVE Sensitive     CEFAZOLIN <=4 SENSITIVE Sensitive     CEFEPIME <=0.12 SENSITIVE Sensitive     CEFTRIAXONE <=0.25 SENSITIVE Sensitive     CIPROFLOXACIN  <=0.25 SENSITIVE Sensitive     GENTAMICIN <=1 SENSITIVE Sensitive     IMIPENEM <=0.25 SENSITIVE Sensitive     NITROFURANTOIN  <=16 SENSITIVE Sensitive     TRIMETH /SULFA  <=20 SENSITIVE Sensitive     AMPICILLIN/SULBACTAM <=2 SENSITIVE Sensitive     PIP/TAZO <=4 SENSITIVE Sensitive ug/mL    * 30,000 COLONIES/mL ESCHERICHIA COLI    Labs: CBC: Recent Labs  Lab 02/11/24 1737 02/12/24 0521  WBC 8.9 4.4  HGB 13.0 11.9*  HCT 37.4 34.6*  MCV 92.1 91.5  PLT 240 228   Basic Metabolic Panel: Recent Labs  Lab 02/11/24 1737  02/12/24 0521  NA 129* 134*  K 3.8 4.0  CL 94* 100  CO2 22 25  GLUCOSE 105* 98  BUN 17 11  CREATININE 0.73 0.61  CALCIUM 8.2* 8.7*   Liver Function Tests: Recent Labs  Lab 02/11/24 2051 02/12/24 0114 02/12/24 0521  AST 1,435* 880* 609*  ALT 612* 547* 492*  ALKPHOS 376* 341* 321*  BILITOT 0.8 0.5 0.5  PROT 6.8 6.5 6.5  ALBUMIN 3.7 3.5 3.4*   CBG: No results for input(s): GLUCAP in the last 168 hours.  Discharge time spent: 40 minutes.  Signed: Deliliah Room, MD [facility] 02/12/2024

## 2024-02-12 NOTE — Assessment & Plan Note (Signed)
 BP currently controlled Continue metoprolol  and diltiazem  pending verification

## 2024-02-12 NOTE — H&P (Signed)
 History and Physical    Patient: Judith Wood FMW:[id number] DOB: 1955-11-03 DOA: 02/11/2024 DOS: the patient was seen and examined on 02/12/2024 PCP: Edman Marsa PARAS, DO  Patient coming from: Home  Chief Complaint:  Chief Complaint  Patient presents with   Chest Pain    HPI: Judith Wood is a 68 y.o. female with medical history significant for Paroxysmal A-fib on flecainide  and Eliquis , hypertension , morbid obesity with prior Roux-en-Y surgery, prior cholecystectomy, recent pneumonia (completed Levaquin  on 12/7), being admitted with suspected hepatotoxicity related to Levaquin  versus acute hepatitis.  Patient presented with a 5-day history of intermittent abdominal pain associated with several episodes of watery nonbloody nonbilious diarrhea.  She denies nausea or vomiting.  On the night of arrival she started having chest discomfort and thus presented to the ED.  She denies shortness of breath, fever or chills.  Her pneumonia symptoms for which she was seen in the urgent care on 12/4 included body aches, cough and fever which have since improved with antibiotic treatment.  She had a negative COVID and flu test and chest x-ray showed a hazy opacity.    In the ED vitals completely withinLimits.  Labs however notable for abnormal LFTs with AST 1435, ALT 612, alk phos 376 with essentially normal bilirubin (TB 0.8, DB 0.5, indirect bili 0.3).  LFTs were normal 10 months ago. Labs otherwise notable for lipase 66, hyponatremia of 129 and normal CBC.  Urinalysis completely normal.  Hepatitis panel pending. EKG showed NSR at 70 Chest x-ray nonacute CT abdomen and pelvis with contrast showed no acute findings in the abdomen or pelvis Right upper quadrant ultrasound pending  Patient treated with an NS bolus Admission requested.     Past Medical History:  Diagnosis Date   Abnormal mammogram of right breast 01/18/2016   Afib (HCC)    Dysrhythmia    Hypertension    Past Surgical  History:  Procedure Laterality Date   APPENDECTOMY  1974   CHOLECYSTECTOMY  2005   COLONOSCOPY WITH PROPOFOL  N/A 09/29/2021   Procedure: COLONOSCOPY WITH PROPOFOL ;  Surgeon: Therisa Bi, MD;  Location: [facility] ENDOSCOPY;  Service: Gastroenterology;  Laterality: N/A;   ROUX-EN-Y GASTRIC BYPASS  2009   TOTAL ABDOMINAL HYSTERECTOMY  2000   WRIST SURGERY Right 2017   Social History:  reports that she has quit smoking. Her smoking use included cigarettes. She has never been exposed to tobacco smoke. She has never used smokeless tobacco. She reports that she does not drink alcohol and does not use drugs.  Allergies  Allergen Reactions   Norco [Hydrocodone-Acetaminophen ] Itching   Tetracyclines & Related Itching and Rash    Family History  Problem Relation Age of Onset   Hypertension Mother    Arrhythmia Mother        Pacemaker implant    Heart attack Father 98   Arrhythmia Sister    Arrhythmia Brother    Arrhythmia Brother    Breast cancer Neg Hx     Prior to Admission medications   Medication Sig Start Date End Date Taking? Authorizing Provider  apixaban  (ELIQUIS ) 5 MG TABS tablet Take 1 tablet by mouth twice daily 08/27/23   Gollan, Timothy J, MD  b complex vitamins tablet Take 1 tablet by mouth daily.    [provider]  Calcium Citrate-Vitamin D  (CALCIUM CITRATE + D PO) Take 2 tablets by mouth daily.    [provider]  CHOLECALCIFEROL PO Take 5,000 Units by mouth daily.    [provider]  D-MANNOSE PO Take 1 capsule by mouth daily.    [provider]  diltiazem  (DILT-XR) 180 MG 24 hr capsule Take 1 capsule (180 mg total) by mouth daily. 07/16/23   Justus Leita DEL, MD  Fe Bisgly-Vit C-Vit B12-FA (GENTLE IRON PO) Take 25 mg by mouth in the morning and at bedtime.    [provider]  flecainide  (TAMBOCOR ) 100 MG tablet Take 1 tablet by mouth twice daily 05/04/23   Gollan, Timothy J, MD  glucosamine-chondroitin 500-400 MG tablet Take 1  tablet by mouth 3 (three) times daily.    [provider]  magnesium 30 MG tablet Take 30 mg by mouth 2 (two) times daily.    [provider]  metoprolol  succinate (TOPROL -XL) 50 MG 24 hr tablet TAKE 1 TABLET BY MOUTH ONCE DAILY WITH OR IMMEDIATLY FOLLOWING A MEAL 05/04/23   Perla Evalene PARAS, MD    Physical Exam: Vitals:   02/11/24 1735 02/11/24 1737 02/11/24 2241 02/12/24 0009  BP:  129/73 (!) 141/71 138/81  Pulse: 69  63 65  Resp: 18  18 (!) 21  Temp: 98 F (36.7 C)  98.6 F (37 C) 97.9 F (36.6 C)  TempSrc: Oral  Oral Oral  SpO2: 95%  100%    Physical Exam Vitals and nursing note reviewed.  Constitutional:      General: She is not in acute distress. HENT:     Head: Normocephalic and atraumatic.  Cardiovascular:     Rate and Rhythm: Normal rate and regular rhythm.     Heart sounds: Normal heart sounds.  Pulmonary:     Effort: Pulmonary effort is normal.     Breath sounds: Normal breath sounds.  Abdominal:     Palpations: Abdomen is soft.     Tenderness: There is no abdominal tenderness.  Neurological:     Mental Status: Mental status is at baseline.     Labs on Admission: I have personally reviewed following labs and imaging studies  CBC: Recent Labs  Lab 02/11/24 1737  WBC 8.9  HGB 13.0  HCT 37.4  MCV 92.1  PLT 240   Basic Metabolic Panel: Recent Labs  Lab 02/11/24 1737  NA 129*  K 3.8  CL 94*  CO2 22  GLUCOSE 105*  BUN 17  CREATININE 0.73  CALCIUM 8.2*   GFR: CrCl cannot be calculated (Unknown ideal weight.). Liver Function Tests: Recent Labs  Lab 02/11/24 2051  AST 1,435*  ALT 612*  ALKPHOS 376*  BILITOT 0.8  PROT 6.8  ALBUMIN 3.7   Recent Labs  Lab 02/11/24 2051  LIPASE 66*   No results for input(s): AMMONIA in the last 168 hours. Coagulation Profile: No results for input(s): INR, PROTIME in the last 168 hours. Cardiac Enzymes: No results for input(s): CKTOTAL, CKMB, CKMBINDEX, TROPONINI in the  last 168 hours. BNP (last 3 results) No results for input(s): PROBNP in the last 8760 hours. HbA1C: No results for input(s): HGBA1C in the last 72 hours. CBG: No results for input(s): GLUCAP in the last 168 hours. Lipid Profile: No results for input(s): CHOL, HDL, LDLCALC, TRIG, CHOLHDL, LDLDIRECT in the last 72 hours. Thyroid Function Tests: No results for input(s): TSH, T4TOTAL, FREET4, T3FREE, THYROIDAB in the last 72 hours. Anemia Panel: No results for input(s): VITAMINB12, FOLATE, FERRITIN, TIBC, IRON, RETICCTPCT in the last 72 hours. Urine analysis:    Component Value Date/Time   COLORURINE YELLOW (A) 02/11/2024 2051   APPEARANCEUR CLEAR (A) 02/11/2024 2051  APPEARANCEUR Clear 09/12/2021 0855   LABSPEC 1.020 02/11/2024 2051   PHURINE 6.0 02/11/2024 2051   GLUCOSEU NEGATIVE 02/11/2024 2051   HGBUR NEGATIVE 02/11/2024 2051   BILIRUBINUR NEGATIVE 02/11/2024 2051   BILIRUBINUR negative 09/10/2023 1109   BILIRUBINUR neg 10/25/2021 1440   BILIRUBINUR Negative 09/12/2021 0855   KETONESUR NEGATIVE 02/11/2024 2051   PROTEINUR NEGATIVE 02/11/2024 2051   UROBILINOGEN 0.2 09/10/2023 1109   NITRITE NEGATIVE 02/11/2024 2051   LEUKOCYTESUR NEGATIVE 02/11/2024 2051    Radiological Exams on Admission: US  Abdomen Limited RUQ (LIVER/GB) Result Date: 02/12/2024 EXAM: Right Upper Quadrant Abdominal Ultrasound TECHNIQUE: Real-time ultrasonography of the right upper quadrant of the abdomen was performed. COMPARISON: CT of the abdomen and pelvis with IV contrast from 07/20/2023 and from today. CLINICAL HISTORY: Elevated LFTs. FINDINGS: LIVER: The liver demonstrates normal echogenicity. Mild intrahepatic biliary dilation prominence is noted, which was seen on the 07/20/2023 CT and is unchanged. This is probably simply related to the prior cholecystectomy. No mass. The hepatic portal vein caliber and directional flow are normal. BILIARY SYSTEM: The gallbladder  is surgically absent. The common bile duct is prominent, measuring 7.6 mm. This measurement is unchanged since 07/20/2023. This is probably simply related to the prior cholecystectomy. OTHER: No right upper quadrant ascites. IMPRESSION: 1. Surgically absent gallbladder. 2. Prominent common bile duct measuring 7.6 mm with mild intrahepatic biliary dilation, unchanged from prior CT on 07/20/2023, likely related to prior cholecystectomy. If ductal obstruction is suspected , MRCP may be helpful for further study. Electronically signed by: Francis Quam MD 02/12/2024 12:05 AM EST RP Workstation: [id number]   CT ABDOMEN PELVIS W CONTRAST Result Date: 02/11/2024 EXAM: CT ABDOMEN AND PELVIS WITH CONTRAST 02/11/2024 09:26:08 PM TECHNIQUE: CT of the abdomen and pelvis was performed with the administration of 100 mL of iohexol  (OMNIPAQUE ) 350 MG/ML injection. Multiplanar reformatted images are provided for review. Automated exposure control, iterative reconstruction, and/or weight-based adjustment of the mA/kV was utilized to reduce the radiation dose to as low as reasonably achievable. COMPARISON: 07/20/2023 CLINICAL HISTORY: LLQ abdominal pain. FINDINGS: LOWER CHEST: Small hiatal hernia. LIVER: The liver is unremarkable. GALLBLADDER AND BILE DUCTS: Prior cholecystectomy. No biliary ductal dilatation. SPLEEN: No acute abnormality. PANCREAS: No acute abnormality. ADRENAL GLANDS: No acute abnormality. KIDNEYS, URETERS AND BLADDER: No stones in the kidneys or ureters. No hydronephrosis. No perinephric or periureteral stranding. Urinary bladder is unremarkable. GI AND BOWEL: Stomach demonstrates no acute abnormality. There is no bowel obstruction. PERITONEUM AND RETROPERITONEUM: No ascites. No free air. VASCULATURE: Aorta is normal in caliber. Aortic atherosclerosis. LYMPH NODES: No lymphadenopathy. REPRODUCTIVE ORGANS: No acute abnormality. BONES AND SOFT TISSUES: No acute osseous abnormality. Infraumbilical ventral hernia  contains fat. IMPRESSION: 1. No acute findings in the abdomen or pelvis. 2. Infraumbilical ventral hernia containing fat without bowel involvement. Electronically signed by: Franky Crease MD 02/11/2024 09:30 PM EST RP Workstation: [id number]   DG Chest 2 View Result Date: 02/11/2024 EXAM: 2 VIEW(S) XRAY OF THE CHEST 02/11/2024 06:04:52 PM COMPARISON: Comparison to 04/11/2022. CLINICAL HISTORY: Chest pain. FINDINGS: LUNGS AND PLEURA: No focal pulmonary opacity. No pleural effusion. No pneumothorax. HEART AND MEDIASTINUM: No acute abnormality of the cardiac and mediastinal silhouettes. BONES AND SOFT TISSUES: No acute osseous abnormality. IMPRESSION: 1. No acute cardiopulmonary process to explain chest pain. Electronically signed by: Franky Crease MD 02/11/2024 06:19 PM EST RP Workstation: [id number]   Data Reviewed for HPI: Relevant notes from primary care and specialist visits, past discharge summaries as available in EHR, including Care Everywhere. Prior  diagnostic testing as pertinent to current admission diagnoses Updated medications and problem lists for reconciliation ED course, including vitals, labs, imaging, treatment and response to treatment Triage notes, nursing and pharmacy notes and ED provider's notes Notable results as noted above in HPI      Assessment and Plan: * Acute hepatitis Possible hepatotoxicity (Levaquin ) Patient presents with abdominal pain, elevated LFTs in the setting of recent pneumonia currently on Levaquin  LFTs with AST 1435, ALT 612, alk phos 376 with essentially normal bilirubin (TB 0.8, DB 0.5, indirect bili 0.3).  LFTs were normal 10 months ago Discontinue Levaquin  Hold flecainide  tonight, though lower suspicion since patient has been taking since 2019 IV hydration Getting acetaminophen  level Follow hepatitis panel Follow right upper quadrant ultrasound Will keep n.p.o. tonight GI consult for additional recommendations Will get poison control consult on  whether appropriate to start NAC   Paroxysmal atrial fibrillation (HCC) Continue Eliquis  Holding flecainide  tonight pending GI consult and recommendations Continue diltiazem  and metoprolol  once verified  Hyponatremia Serum sodium 129, uncertain etiology Monitor post fluid bolus  Obesity, Class III, BMI 40-49.9 (morbid obesity) (HCC) History of Roux-en-Y Complicating factor to long-term health outcomes  Benign essential HTN BP currently controlled Continue metoprolol  and diltiazem  pending verification    DVT prophylaxis: SCD pending INR  Consults: GI  Advance Care Planning: full code  Family Communication: none  Disposition Plan: Back to previous home environment  Severity of Illness: The appropriate patient status for this patient is OBSERVATION. Observation status is judged to be reasonable and necessary in order to provide the required intensity of service to ensure the patient's safety. The patient's presenting symptoms, physical exam findings, and initial radiographic and laboratory data in the context of their medical condition is felt to place them at decreased risk for further clinical deterioration. Furthermore, it is anticipated that the patient will be medically stable for discharge from the hospital within 2 midnights of admission.   Author: Delayne LULLA Solian, MD 02/12/2024 12:20 AM  For on call review [email or web address].

## 2024-02-12 NOTE — Assessment & Plan Note (Addendum)
 Possible hepatotoxicity (Levaquin ) Patient presents with abdominal pain, elevated LFTs in the setting of recent pneumonia currently on Levaquin  LFTs with AST 1435, ALT 612, alk phos 376 with essentially normal bilirubin (TB 0.8, DB 0.5, indirect bili 0.3).  LFTs were normal 10 months ago Discontinue Levaquin  Hold flecainide  tonight, though lower suspicion since patient has been taking since 2019 IV hydration Getting acetaminophen  level Follow hepatitis panel Follow right upper quadrant ultrasound Will keep n.p.o. tonight GI consult for additional recommendations Will get poison control consult on whether appropriate to start NAC

## 2024-02-12 NOTE — Assessment & Plan Note (Addendum)
 History of Roux-en-Y Complicating factor to long-term health outcomes

## 2024-02-12 NOTE — Progress Notes (Signed)
  Progress Note   Patient: Judith Wood FMW:[id number] DOB: 1956/03/04 DOA: 02/11/2024     0 DOS: the patient was seen and examined on 02/12/2024   Same Day Admission Note:  29 F admitted last night with right chest pain as well as RLQ abdominal pain and found to have elevated liver enzymes. Etiology is thought to be due to recent Levofloxacin  use.  GI consulted and liver enzymes are trending downwards. HIV and acute hepatitis panel are negative. She lives at home with her husband and is IADL. Spoke to Dr Jinny.  Author: Deliliah Room, MD 02/12/2024 10:52 AM  For on call review [email or web address].

## 2024-02-13 ENCOUNTER — Telehealth: Payer: Self-pay

## 2024-02-13 NOTE — Transitions of Care (Post Inpatient/ED Visit) (Signed)
° °  02/13/2024  Name: Judith Wood MRN: [id number] DOB: 08-May-1955  Today's TOC FU Call Status: Today's TOC FU Call Status:: Successful TOC FU Call Completed TOC FU Call Complete Date: 02/13/24  Patient's Name and Date of Birth confirmed. Name, DOB  Transition Care Management Follow-up Telephone Call Date of Discharge: 02/12/24 Discharge Facility[facility]) Type of Discharge: Inpatient Admission Primary Inpatient Discharge Diagnosis:: abd pain How have you been since you were released from the hospital?: Better Any questions or concerns?: No  Items Reviewed: Did you receive and understand the discharge instructions provided?: Yes Medications obtained,verified, and reconciled?: Yes (Medications Reviewed) Any new allergies since your discharge?: No Dietary orders reviewed?: Yes Do you have support at home?: Yes People in Home [RPT]: spouse  Medications Reviewed Today: Medications Reviewed Today   Medications were not reviewed in this encounter     Home Care and Equipment/Supplies: Were Home Health Services Ordered?: NA Any new equipment or medical supplies ordered?: NA  Functional Questionnaire: Do you need assistance with bathing/showering or dressing?: No Do you need assistance with meal preparation?: No Do you need assistance with eating?: No Do you have difficulty maintaining continence: No Do you need assistance with getting out of bed/getting out of a chair/moving?: No Do you have difficulty managing or taking your medications?: No  Follow up appointments reviewed: PCP Follow-up appointment confirmed?: No (declined) MD Provider Line Number:[phone] Given: No Specialist Hospital Follow-up appointment confirmed?: NA Do you need transportation to your follow-up appointment?: No Do you understand care options if your condition(s) worsen?: Yes-patient verbalized understanding    SIGNATURE Julian Lemmings, LPN [facility]  Advisor Direct Dial [phone]

## 2024-02-17 ENCOUNTER — Other Ambulatory Visit: Payer: Self-pay | Admitting: Cardiovascular Disease

## 2024-02-18 NOTE — Telephone Encounter (Signed)
 Prescription refill request for Eliquis  received. Indication:afib Last office visit:12/24 Scr: 0.61  12/25 Age:68 Weight:116.4  kg  Prescription refilled

## 2024-02-25 ENCOUNTER — Ambulatory Visit: Admitting: Family Medicine

## 2024-02-25 ENCOUNTER — Encounter: Payer: Self-pay | Admitting: Family Medicine

## 2024-02-25 VITALS — BP 120/74 | HR 64 | Ht 65.0 in | Wt 256.5 lb

## 2024-02-25 DIAGNOSIS — Z9884 Bariatric surgery status: Secondary | ICD-10-CM | POA: Diagnosis not present

## 2024-02-25 DIAGNOSIS — R748 Abnormal levels of other serum enzymes: Secondary | ICD-10-CM | POA: Diagnosis not present

## 2024-02-25 DIAGNOSIS — K760 Fatty (change of) liver, not elsewhere classified: Secondary | ICD-10-CM | POA: Diagnosis not present

## 2024-02-25 DIAGNOSIS — E66813 Obesity, class 3: Secondary | ICD-10-CM

## 2024-02-25 LAB — CBC WITH DIFFERENTIAL/PLATELET
Absolute Lymphocytes: 1200 {cells}/uL (ref 850–3900)
Absolute Monocytes: 485 {cells}/uL (ref 200–950)
Basophils Absolute: 19 {cells}/uL (ref 0–200)
Basophils Relative: 0.4 %
Eosinophils Absolute: 0 {cells}/uL — ABNORMAL LOW (ref 15–500)
Eosinophils Relative: 0 %
HCT: 39.4 % (ref 35.9–46.0)
Hemoglobin: 12.8 g/dL (ref 11.7–15.5)
MCH: 30.8 pg (ref 27.0–33.0)
MCHC: 32.5 g/dL (ref 31.6–35.4)
MCV: 94.7 fL (ref 81.4–101.7)
MPV: 8.6 fL (ref 7.5–12.5)
Monocytes Relative: 10.1 %
Neutro Abs: 3096 {cells}/uL (ref 1500–7800)
Neutrophils Relative %: 64.5 %
Platelets: 364 Thousand/uL (ref 140–400)
RBC: 4.16 Million/uL (ref 3.80–5.10)
RDW: 12 % (ref 11.0–15.0)
Total Lymphocyte: 25 %
WBC: 4.8 Thousand/uL (ref 3.8–10.8)

## 2024-02-25 LAB — COMPREHENSIVE METABOLIC PANEL WITH GFR
AG Ratio: 1.3 (calc) (ref 1.0–2.5)
ALT: 51 U/L — ABNORMAL HIGH (ref 6–29)
AST: 36 U/L — ABNORMAL HIGH (ref 10–35)
Albumin: 4 g/dL (ref 3.6–5.1)
Alkaline phosphatase (APISO): 112 U/L (ref 37–153)
BUN: 24 mg/dL (ref 7–25)
CO2: 29 mmol/L (ref 20–32)
Calcium: 9.1 mg/dL (ref 8.6–10.4)
Chloride: 96 mmol/L — ABNORMAL LOW (ref 98–110)
Creat: 0.8 mg/dL (ref 0.50–1.05)
Globulin: 3.1 g/dL (ref 1.9–3.7)
Glucose, Bld: 94 mg/dL (ref 65–99)
Potassium: 5 mmol/L (ref 3.5–5.3)
Sodium: 131 mmol/L — ABNORMAL LOW (ref 135–146)
Total Bilirubin: 0.4 mg/dL (ref 0.2–1.2)
Total Protein: 7.1 g/dL (ref 6.1–8.1)
eGFR: 81 mL/min/1.73m2

## 2024-02-25 NOTE — Addendum Note (Signed)
 Addended by: EDMAN MARSA PARAS on: 02/25/2024 06:42 PM   Modules accepted: Level of Service

## 2024-02-25 NOTE — Patient Instructions (Addendum)
 Thank you for coming to the office today.  We will re-check your blood work today and check on liver enzymes.  I do agree with [employer]  vials cash pay however I would wait until liver enzymes have resolved.  Telehealth nutritionist option  [email or web address]  Or Weight [employer]chers  [email or web address]  Please schedule a Follow-up Appointment to: Return if symptoms worsen or fail to improve.  If you have any other questions or concerns, please feel free to call the office or send a message through MyChart. You may also schedule an earlier appointment if necessary.  Additionally, you may be receiving a survey about your experience at our office within a few days to 1 week by e-mail or mail. We value your feedback.  Marsa Officer, DO [facility], [place]

## 2024-02-25 NOTE — Progress Notes (Addendum)
 "  Subjective:    Patient ID: Judith Wood, female    DOB: 10-May-1955, 68 y.o.   MRN: [id number]  Judith Wood is a 68 y.o. female presenting on 02/25/2024 for Hospitalization Follow-up   HPI  Discussed the use of AI scribe software for clinical note transcription with the patient, who gave verbal consent to proceed.  History of Present Illness   [name] is a 68 year old female who presents for a follow-up after an ER visit for abdominal and chest pain.   HOSPITAL FOLLOW-UP VISIT  Hospital/Location: [facility] Date of Admission: 02/11/24 Date of Discharge: 02/12/24 Transitions of care telephone call: 02/13/24 Julian Lemmings LPN completed [facility]  Reason for Admission: Abdominal Pain  FOLLOW-UP  - Hospital H&P and Discharge Summary have been reviewed - Patient presents today about 12 days after recent hospitalization  Pneumonia followed by Abdominal and chest pain Recent course with cough and pneumonia 02/07/24 concern for cough and pneumonia, went to walk in clinic, X-ray showed RLL pneumonia hazy opacity, started Levaquin  750mg  x 5 days Then - Experienced sharp abdominal pain after consuming spicy soup, initially attributed to the meal - Mild abdominal pain persisted over the weekend - Developed severe, sharp chest pain on Monday, prompting a 911 call - No current chest pain  Liver enzyme elevation and biliary findings - Significantly elevated liver enzymes during ER visit (AST 1435, ALT 612) - Liver enzyme levels were decreasing by the time of discharge - History of mildly elevated liver enzymes on prior blood work, previously not concerning - CT scan unremarkable - Ultrasound revealed prominent common bile duct with intrahepatic biliary dilation - History of cholecystectomy approximately 20 years ago  Antibiotic use and adverse effects - Completed a 5-day course of Levaquin  (750 mg) for pneumonia diagnosed one week prior to ER visit - Poison control consulted due to  suspicion of Levaquin -induced liver enzyme elevation  Oropharyngeal symptoms - Persistent 'frogginess' in throat, recurring every few years - No current fatigue or significant cough   - Today reports overall has done well after discharge. Symptoms of abdominal pain and cough pneumonia have resolved   - New medications on discharge: None - Changes to current meds on discharge: None  I have reviewed the discharge medication list, and have reconciled the current and discharge medications today.        11/30/2023    4:02 PM 09/25/2023   10:10 AM 03/20/2023    9:37 AM  Depression screen PHQ 2/9  Decreased Interest 0 0 0  Down, Depressed, Hopeless 0 0 0  PHQ - 2 Score 0 0 0  Altered sleeping 0    Tired, decreased energy 0    Change in appetite 0    Feeling bad or failure about yourself  0    Trouble concentrating 0    Moving slowly or fidgety/restless 0    Suicidal thoughts 0    PHQ-9 Score 0     Difficult doing work/chores Not difficult at all       Data saved with a previous flowsheet row definition       09/25/2023   10:10 AM 09/07/2021   10:51 AM 12/29/2020    1:52 PM 04/05/2020    8:43 AM  GAD 7 : Generalized Anxiety Score  Nervous, Anxious, on Edge 0 0 0 0  Control/stop worrying 0 0 0 0  Worry too much - different things 0 0 0 0  Trouble relaxing 0 0 0 0  Restless 0 0  0 0  Easily annoyed or irritable 0 0 0 0  Afraid - awful might happen 0 0 0 0  Total GAD 7 Score 0 0 0 0  Anxiety Difficulty  Not difficult at all Not difficult at all     Social History[1]  Review of Systems Per HPI unless specifically indicated above     Objective:    BP 120/74 (BP Location: Left Arm, Patient Position: Sitting, Cuff Size: Large)   Pulse 64   Ht 5' 5 (1.651 m)   Wt 256 lb 8 oz (116.3 kg)   SpO2 96%   BMI 42.68 kg/m   Wt Readings from Last 3 Encounters:  02/25/24 256 lb 8 oz (116.3 kg)  11/12/23 256 lb 9.6 oz (116.4 kg)  10/18/23 249 lb (112.9 kg)    Physical  Exam Vitals and nursing note reviewed.  Constitutional:      General: She is not in acute distress.    Appearance: She is well-developed. She is obese. She is not diaphoretic.     Comments: Well-appearing, comfortable, cooperative  HENT:     Head: Normocephalic and atraumatic.  Eyes:     General:        Right eye: No discharge.        Left eye: No discharge.     Conjunctiva/sclera: Conjunctivae normal.  Neck:     Thyroid: No thyromegaly.  Cardiovascular:     Rate and Rhythm: Normal rate and regular rhythm.     Heart sounds: Normal heart sounds. No murmur heard. Pulmonary:     Effort: Pulmonary effort is normal. No respiratory distress.     Breath sounds: Normal breath sounds. No wheezing or rales.  Musculoskeletal:        General: Normal range of motion.     Cervical back: Normal range of motion and neck supple.  Lymphadenopathy:     Cervical: No cervical adenopathy.  Skin:    General: Skin is warm and dry.     Findings: No erythema or rash.  Neurological:     Mental Status: She is alert and oriented to person, place, and time.  Psychiatric:        Behavior: Behavior normal.     Comments: Well groomed, good eye contact, normal speech and thoughts     I have personally reviewed the radiology report from 02/11/24 CXR.  EXAM: 2 VIEW(S) XRAY OF THE CHEST 02/11/2024 06:04:52 PM   COMPARISON: Comparison to 04/11/2022.   CLINICAL HISTORY: Chest pain.   FINDINGS: LUNGS AND PLEURA: No focal pulmonary opacity. No pleural effusion. No pneumothorax.   HEART AND MEDIASTINUM: No acute abnormality of the cardiac and mediastinal silhouettes.   BONES AND SOFT TISSUES: No acute osseous abnormality.   IMPRESSION: 1. No acute cardiopulmonary process to explain chest pain.   Electronically signed by: Franky Crease MD 02/11/2024 06:19 PM EST RP Workstation: [id number]  ----  EXAM: CT ABDOMEN AND PELVIS WITH CONTRAST 02/11/2024 09:26:08 PM   TECHNIQUE: CT of the abdomen  and pelvis was performed with the administration of 100 mL of iohexol  (OMNIPAQUE ) 350 MG/ML injection. Multiplanar reformatted images are provided for review. Automated exposure control, iterative reconstruction, and/or weight-based adjustment of the mA/kV was utilized to reduce the radiation dose to as low as reasonably achievable.   COMPARISON: 07/20/2023   CLINICAL HISTORY: LLQ abdominal pain.   FINDINGS:   LOWER CHEST: Small hiatal hernia.   LIVER: The liver is unremarkable.   GALLBLADDER AND BILE DUCTS: Prior cholecystectomy. No  biliary ductal dilatation.   SPLEEN: No acute abnormality.   PANCREAS: No acute abnormality.   ADRENAL GLANDS: No acute abnormality.   KIDNEYS, URETERS AND BLADDER: No stones in the kidneys or ureters. No hydronephrosis. No perinephric or periureteral stranding. Urinary bladder is unremarkable.   GI AND BOWEL: Stomach demonstrates no acute abnormality. There is no bowel obstruction.   PERITONEUM AND RETROPERITONEUM: No ascites. No free air.   VASCULATURE: Aorta is normal in caliber. Aortic atherosclerosis.   LYMPH NODES: No lymphadenopathy.   REPRODUCTIVE ORGANS: No acute abnormality.   BONES AND SOFT TISSUES: No acute osseous abnormality. Infraumbilical ventral hernia contains fat.   IMPRESSION: 1. No acute findings in the abdomen or pelvis. 2. Infraumbilical ventral hernia containing fat without bowel involvement.   Electronically signed by: Franky Crease MD 02/11/2024 09:30 PM EST RP Workstation: [id number]  ----------------  EXAM: Right Upper Quadrant Abdominal Ultrasound   TECHNIQUE: Real-time ultrasonography of the right upper quadrant of the abdomen was performed.   COMPARISON: CT of the abdomen and pelvis with IV contrast from 07/20/2023 and from today.   CLINICAL HISTORY: Elevated LFTs.   FINDINGS:   LIVER: The liver demonstrates normal echogenicity. Mild intrahepatic biliary dilation prominence is  noted, which was seen on the 07/20/2023 CT and is unchanged. This is probably simply related to the prior cholecystectomy. No mass. The hepatic portal vein caliber and directional flow are normal.   BILIARY SYSTEM: The gallbladder is surgically absent. The common bile duct is prominent, measuring 7.6 mm. This measurement is unchanged since 07/20/2023. This is probably simply related to the prior cholecystectomy.   OTHER: No right upper quadrant ascites.   IMPRESSION: 1. Surgically absent gallbladder. 2. Prominent common bile duct measuring 7.6 mm with mild intrahepatic biliary dilation, unchanged from prior CT on 07/20/2023, likely related to prior cholecystectomy. If ductal obstruction is suspected , MRCP may be helpful for further study.   Electronically signed by: Francis Quam MD 02/12/2024 12:05 AM EST RP Workstation: [id number]  Results for orders placed or performed during the hospital encounter of 02/11/24  Basic metabolic panel   Collection Time: 02/11/24  5:37 PM  Result Value Ref Range   Sodium 129 (L) 135 - 145 mmol/L   Potassium 3.8 3.5 - 5.1 mmol/L   Chloride 94 (L) 98 - 111 mmol/L   CO2 22 22 - 32 mmol/L   Glucose, Bld 105 (H) 70 - 99 mg/dL   BUN 17 8 - 23 mg/dL   Creatinine, Ser 9.26 0.44 - 1.00 mg/dL   Calcium 8.2 (L) 8.9 - 10.3 mg/dL   GFR, Estimated >39 >39 mL/min   Anion gap 13 5 - 15  CBC   Collection Time: 02/11/24  5:37 PM  Result Value Ref Range   WBC 8.9 4.0 - 10.5 K/uL   RBC 4.06 3.87 - 5.11 MIL/uL   Hemoglobin 13.0 12.0 - 15.0 g/dL   HCT 62.5 63.9 - 53.9 %   MCV 92.1 80.0 - 100.0 fL   MCH 32.0 26.0 - 34.0 pg   MCHC 34.8 30.0 - 36.0 g/dL   RDW 87.6 88.4 - 84.4 %   Platelets 240 150 - 400 K/uL   nRBC 0.0 0.0 - 0.2 %  Troponin T, High Sensitivity   Collection Time: 02/11/24  5:37 PM  Result Value Ref Range   Troponin T High Sensitivity <15 0 - 19 ng/L  Hepatic function panel   Collection Time: 02/11/24  8:51 PM  Result Value Ref Range  Total Protein 6.8 6.5 - 8.1 g/dL   Albumin 3.7 3.5 - 5.0 g/dL   AST 8,564 (H) 15 - 41 U/L   ALT 612 (H) 0 - 44 U/L   Alkaline Phosphatase 376 (H) 38 - 126 U/L   Total Bilirubin 0.8 0.0 - 1.2 mg/dL   Bilirubin, Direct 0.5 (H) 0.0 - 0.2 mg/dL   Indirect Bilirubin 0.3 0.3 - 0.9 mg/dL  Lipase, blood   Collection Time: 02/11/24  8:51 PM  Result Value Ref Range   Lipase 66 (H) 11 - 51 U/L  Urinalysis, Routine w reflex microscopic -Urine, Clean Catch   Collection Time: 02/11/24  8:51 PM  Result Value Ref Range   Color, Urine YELLOW (A) YELLOW   APPearance CLEAR (A) CLEAR   Specific Gravity, Urine 1.020 1.005 - 1.030   pH 6.0 5.0 - 8.0   Glucose, UA NEGATIVE NEGATIVE mg/dL   Hgb urine dipstick NEGATIVE NEGATIVE   Bilirubin Urine NEGATIVE NEGATIVE   Ketones, ur NEGATIVE NEGATIVE mg/dL   Protein, ur NEGATIVE NEGATIVE mg/dL   Nitrite NEGATIVE NEGATIVE   Leukocytes,Ua NEGATIVE NEGATIVE  Troponin T, High Sensitivity   Collection Time: 02/11/24  8:51 PM  Result Value Ref Range   Troponin T High Sensitivity <15 0 - 19 ng/L  Hepatitis panel, acute   Collection Time: 02/12/24  1:14 AM  Result Value Ref Range   Hepatitis B Surface Ag NON REACTIVE NON REACTIVE   HCV Ab NON REACTIVE NON REACTIVE   Hep A IgM NON REACTIVE NON REACTIVE   Hep B C IgM NON REACTIVE NON REACTIVE  Protime-INR   Collection Time: 02/12/24  1:14 AM  Result Value Ref Range   Prothrombin Time 14.6 11.4 - 15.2 seconds   INR 1.1 0.8 - 1.2  HIV Antibody (routine testing w rflx)   Collection Time: 02/12/24  1:14 AM  Result Value Ref Range   HIV Screen 4th Generation wRfx Non Reactive Non Reactive  CK   Collection Time: 02/12/24  1:14 AM  Result Value Ref Range   Total CK 54 38 - 234 U/L  Hepatic function panel   Collection Time: 02/12/24  1:14 AM  Result Value Ref Range   Total Protein 6.5 6.5 - 8.1 g/dL   Albumin 3.5 3.5 - 5.0 g/dL   AST 119 (H) 15 - 41 U/L   ALT 547 (H) 0 - 44 U/L   Alkaline Phosphatase 341 (H)  38 - 126 U/L   Total Bilirubin 0.5 0.0 - 1.2 mg/dL   Bilirubin, Direct 0.2 0.0 - 0.2 mg/dL   Indirect Bilirubin 0.3 0.3 - 0.9 mg/dL  Comprehensive metabolic panel   Collection Time: 02/12/24  5:21 AM  Result Value Ref Range   Sodium 134 (L) 135 - 145 mmol/L   Potassium 4.0 3.5 - 5.1 mmol/L   Chloride 100 98 - 111 mmol/L   CO2 25 22 - 32 mmol/L   Glucose, Bld 98 70 - 99 mg/dL   BUN 11 8 - 23 mg/dL   Creatinine, Ser 9.38 0.44 - 1.00 mg/dL   Calcium 8.7 (L) 8.9 - 10.3 mg/dL   Total Protein 6.5 6.5 - 8.1 g/dL   Albumin 3.4 (L) 3.5 - 5.0 g/dL   AST 390 (H) 15 - 41 U/L   ALT 492 (H) 0 - 44 U/L   Alkaline Phosphatase 321 (H) 38 - 126 U/L   Total Bilirubin 0.5 0.0 - 1.2 mg/dL   GFR, Estimated >39 >39 mL/min   Anion  gap 8 5 - 15  CBC   Collection Time: 02/12/24  5:21 AM  Result Value Ref Range   WBC 4.4 4.0 - 10.5 K/uL   RBC 3.78 (L) 3.87 - 5.11 MIL/uL   Hemoglobin 11.9 (L) 12.0 - 15.0 g/dL   HCT 65.3 (L) 63.9 - 53.9 %   MCV 91.5 80.0 - 100.0 fL   MCH 31.5 26.0 - 34.0 pg   MCHC 34.4 30.0 - 36.0 g/dL   RDW 87.7 88.4 - 84.4 %   Platelets 228 150 - 400 K/uL   nRBC 0.0 0.0 - 0.2 %  Acetaminophen  level   Collection Time: 02/12/24  5:21 AM  Result Value Ref Range   Acetaminophen  (Tylenol ), Serum <10 (L) 10 - 30 ug/mL      Assessment & Plan:   Problem List Items Addressed This Visit     History of Roux-en-Y gastric bypass (Chronic)   Hepatic steatosis   Relevant Orders   Comprehensive metabolic panel with GFR   CBC with Differential/Platelet   Obesity, Class III, BMI 40-49.9 (morbid obesity) (HCC)   Other Visit Diagnoses       Elevated liver enzymes    -  Primary   Relevant Orders   Comprehensive metabolic panel with GFR        Acute hepatitis vs Transaminitis / acute liver injury Likely secondary to recent pneumonia and FQ antibiotic use. Liver enzymes improved. Monitoring needed for liver function recovery. - Ordered liver function tests to assess current liver  enzyme levels. - Scheduled follow-up appointment in February to review test results and discuss further management.  Hepatic steatosis Chronic condition with recent exacerbation likely due to acute illness. Liver enzymes improved.  Zepbound  initiation deferred until liver function stabilizes. - Continue to monitor liver function tests to assess stability before initiating Zepbound . - Encouraged lifestyle modifications including dietary changes and weight management. - Discussed she will need stabilized LFTs, and consider Abdominal US  imaging or FibraScan and FIB 4 calculation prior to proceed w/ Zepbound  otherwise, she states it is not covered so she would use cash pay Zepbound  Vials. This is option does not require all testing.  Obesity, class 3, status post Roux-en-Y gastric bypass Obesity management ongoing with consideration of Zepbound  for weight loss. Lifestyle modifications recommended. - Recommended structured weight management program such as Nourish or Weight Watchers. - Will reassess weight management plan after liver function stabilizes.     Upcoming [facility] doctor in January 2026  Orders Placed This Encounter  Procedures   Comprehensive metabolic panel with GFR   CBC with Differential/Platelet    No orders of the defined types were placed in this encounter.   Follow up plan: Return if symptoms worsen or fail to improve.  Future labs ordered for 03/2024   Marsa Officer, DO [facility] 02/25/2024, 2:26 PM     [1]  Social History Tobacco Use   Smoking status: Former    Types: Cigarettes    Passive exposure: Never   Smokeless tobacco: Never  Vaping Use   Vaping status: Never Used  Substance Use Topics   Alcohol use: No   Drug use: No   "

## 2024-02-26 ENCOUNTER — Encounter: Payer: Self-pay | Admitting: Family Medicine

## 2024-02-26 ENCOUNTER — Other Ambulatory Visit: Payer: Self-pay | Admitting: Internal Medicine

## 2024-02-26 ENCOUNTER — Ambulatory Visit: Payer: Self-pay | Admitting: Family Medicine

## 2024-02-26 DIAGNOSIS — I10 Essential (primary) hypertension: Secondary | ICD-10-CM

## 2024-02-27 NOTE — Telephone Encounter (Signed)
 Requested Prescriptions  Pending Prescriptions Disp Refills   DILT-XR 180 MG 24 hr capsule [Pharmacy Med Name: Dilt-XR 180 MG Oral Capsule Extended Release 24 Hour] 90 capsule 0    Sig: Take 1 capsule by mouth once daily     Cardiovascular: Calcium Channel Blockers 3 Failed - 02/27/2024  4:24 PM      Failed - ALT in normal range and within 360 days    ALT  Date Value Ref Range Status  02/25/2024 51 (H) 6 - 29 U/L Final         Failed - AST in normal range and within 360 days    AST  Date Value Ref Range Status  02/25/2024 36 (H) 10 - 35 U/L Final         Passed - Cr in normal range and within 360 days    Creat  Date Value Ref Range Status  02/25/2024 0.80 0.50 - 1.05 mg/dL Final         Passed - Last BP in normal range    BP Readings from Last 1 Encounters:  02/25/24 120/74         Passed - Last Heart Rate in normal range    Pulse Readings from Last 1 Encounters:  02/25/24 64         Passed - Valid encounter within last 6 months    Recent Outpatient Visits           2 days ago Elevated liver enzymes   [facility] [facility] [place], Marsa PARAS, DO   5 months ago Encounter to establish care with new doctor   [facility] [place] Marsa PARAS, DO   7 months ago Abdominal wall mass of periumbilical region   [facility], Leita DEL, MD

## 2024-02-29 ENCOUNTER — Encounter: Payer: Self-pay | Admitting: Family Medicine

## 2024-02-29 DIAGNOSIS — E66813 Obesity, class 3: Secondary | ICD-10-CM

## 2024-02-29 MED ORDER — TIRZEPATIDE-WEIGHT MANAGEMENT 2.5 MG/0.5ML ~~LOC~~ SOLN: 2.5000 mg | SUBCUTANEOUS | 0 refills | Status: DC

## 2024-03-15 NOTE — Progress Notes (Unsigned)
 "               MRN : [id number]  Judith Wood is a 69 y.o. ([birth year]) female who presents with chief complaint of varicose veins hurt.  History of Present Illness:  The patient returns for followup evaluation 3 months after the initial visit. The patient continues to have pain in the lower extremities with dependency. The pain is lessened with elevation. Graduated compression stockings, Class I (20-30 mmHg), have been worn but the stockings do not eliminate the leg pain. Over-the-counter analgesics do not improve the symptoms. The degree of discomfort continues to interfere with daily activities. The patient notes the pain in the legs is causing problems with daily exercise, at the workplace and even with household activities and maintenance such as standing in the kitchen preparing meals and doing dishes.   Duplex ultrasound bilateral lower extremity venous system demonstrates normal deep venous system bilaterally. On the right there is reflux in the proximal great saphenous vein, no reflux noted in the small saphenous vein. On the left no superficial reflux is identified.    Active Medications[1]  Past Medical History:  Diagnosis Date   Abnormal mammogram of right breast 01/18/2016   Afib (HCC)    Dysrhythmia    Hypertension     Past Surgical History:  Procedure Laterality Date   APPENDECTOMY  1974   CHOLECYSTECTOMY  2005   COLONOSCOPY WITH PROPOFOL  N/A 09/29/2021   Procedure: COLONOSCOPY WITH PROPOFOL ;  Surgeon: Therisa Bi, MD;  Location: [facility] ENDOSCOPY;  Service: Gastroenterology;  Laterality: N/A;   ROUX-EN-Y GASTRIC BYPASS  2009   TOTAL ABDOMINAL HYSTERECTOMY  2000   WRIST SURGERY Right 2017    Social History Social History[2]  Family History Family History  Problem Relation Age of Onset   Hypertension Mother    Arrhythmia Mother        Pacemaker implant    Heart attack Father 34   Arrhythmia Sister    Arrhythmia Brother    Arrhythmia Brother    Breast cancer  Neg Hx     Allergies[3]   REVIEW OF SYSTEMS (Negative unless checked)  Constitutional: [] Weight loss  [] Fever  [] Chills Cardiac: [] Chest pain   [] Chest pressure   [] Palpitations   [] Shortness of breath when laying flat   [] Shortness of breath with exertion. Vascular:  [] Pain in legs with walking   [x] Pain in legs with standing  [] History of DVT   [] Phlebitis   [] Swelling in legs   [x] Varicose veins   [] Non-healing ulcers Pulmonary:   [] Uses home oxygen   [] Productive cough   [] Hemoptysis   [] Wheeze  [] COPD   [] Asthma Neurologic:  [] Dizziness   [] Seizures   [] History of stroke   [] History of TIA  [] Aphasia   [] Vissual changes   [] Weakness or numbness in arm   [] Weakness or numbness in leg Musculoskeletal:   [] Joint swelling   [] Joint pain   [] Low back pain Hematologic:  [] Easy bruising  [] Easy bleeding   [] Hypercoagulable state   [] Anemic Gastrointestinal:  [] Diarrhea   [] Vomiting  [] Gastroesophageal reflux/heartburn   [] Difficulty swallowing. Genitourinary:  [] Chronic kidney disease   [] Difficult urination  [] Frequent urination   [] Blood in urine Skin:  [] Rashes   [] Ulcers  Psychological:  [] History of anxiety   []  History of major depression.  Physical Examination  There were no vitals filed for this visit. There is no height or weight on file to calculate BMI. Gen: WD/WN, NAD Head: Alexander/AT, No temporalis wasting.  Ear/Nose/Throat: Hearing  grossly intact, nares w/o erythema or drainage, pinna without lesions Eyes: PER, EOMI, sclera nonicteric.  Neck: Supple, no gross masses.  No JVD.  Pulmonary:  Good air movement, no audible wheezing, no use of accessory muscles.  Cardiac: RRR, precordium not hyperdynamic. Vascular:  Large varicosities present, greater than 10 mm ***.  Veins are tender to palpation  Mild venous stasis changes to the legs bilaterally.  Trace soft pitting edema CEAP [id number] Vessel Right Left  Radial Palpable Palpable  Gastrointestinal: soft, non-distended. No  guarding/no peritoneal signs.  Musculoskeletal: M/S 5/5 throughout.  No deformity.  Neurologic: CN 2-12 intact. Pain and light touch intact in extremities.  Symmetrical.  Speech is fluent. Motor exam as listed above. Psychiatric: Judgment intact, Mood & affect appropriate for pt's clinical situation. Dermatologic: Venous rashes no ulcers noted.  No changes consistent with cellulitis. Lymph : No lichenification or skin changes of chronic lymphedema.  CBC Lab Results  Component Value Date   WBC 4.8 02/25/2024   HGB 12.8 02/25/2024   HCT 39.4 02/25/2024   MCV 94.7 02/25/2024   PLT 364 02/25/2024    BMET    Component Value Date/Time   NA 131 (L) 02/25/2024 1450   NA 135 03/23/2023 0837   K 5.0 02/25/2024 1450   CL 96 (L) 02/25/2024 1450   CO2 29 02/25/2024 1450   GLUCOSE 94 02/25/2024 1450   BUN 24 02/25/2024 1450   BUN 21 03/23/2023 0837   CREATININE 0.80 02/25/2024 1450   CALCIUM 9.1 02/25/2024 1450   GFRNONAA >60 02/12/2024 0521   GFRAA 81 03/16/2020 0000   CrCl cannot be calculated (Unknown ideal weight.).  COAG Lab Results  Component Value Date   INR 1.1 02/12/2024    Radiology No results found.   Assessment/Plan There are no diagnoses linked to this encounter.   Cordella Shawl, MD  03/15/2024 3:11 PM      [1]  No outpatient medications have been marked as taking for the 03/17/24 encounter (Appointment) with Shawl, Cordella MATSU, MD.  [2]  Social History Tobacco Use   Smoking status: Former    Types: Cigarettes    Passive exposure: Never   Smokeless tobacco: Never  Vaping Use   Vaping status: Never Used  Substance Use Topics   Alcohol use: No   Drug use: No  [3]  Allergies Allergen Reactions   Azithromycin  Other (See Comments)    Cannot take due to Afib   Hydrocodone-Acetaminophen  Itching and Other (See Comments)   Norco [Hydrocodone-Acetaminophen ] Itching   Tetracyclines & Related Itching and Rash   "

## 2024-03-17 ENCOUNTER — Encounter (INDEPENDENT_AMBULATORY_CARE_PROVIDER_SITE_OTHER): Payer: Self-pay | Admitting: Vascular Surgery

## 2024-03-17 ENCOUNTER — Ambulatory Visit (INDEPENDENT_AMBULATORY_CARE_PROVIDER_SITE_OTHER): Admitting: Vascular Surgery

## 2024-03-17 VITALS — BP 110/66 | HR 69 | Resp 18 | Ht 65.0 in | Wt 234.0 lb

## 2024-03-17 DIAGNOSIS — I83813 Varicose veins of bilateral lower extremities with pain: Secondary | ICD-10-CM

## 2024-03-17 DIAGNOSIS — I872 Venous insufficiency (chronic) (peripheral): Secondary | ICD-10-CM | POA: Diagnosis not present

## 2024-03-17 DIAGNOSIS — I480 Paroxysmal atrial fibrillation: Secondary | ICD-10-CM

## 2024-03-17 DIAGNOSIS — E782 Mixed hyperlipidemia: Secondary | ICD-10-CM | POA: Diagnosis not present

## 2024-03-17 DIAGNOSIS — I10 Essential (primary) hypertension: Secondary | ICD-10-CM | POA: Diagnosis not present

## 2024-03-18 ENCOUNTER — Ambulatory Visit: Attending: Student | Admitting: Student

## 2024-03-18 ENCOUNTER — Encounter: Payer: Self-pay | Admitting: Student

## 2024-03-18 VITALS — BP 114/74 | Resp 18 | Ht 65.0 in | Wt 253.0 lb

## 2024-03-18 DIAGNOSIS — E66813 Obesity, class 3: Secondary | ICD-10-CM | POA: Insufficient documentation

## 2024-03-18 DIAGNOSIS — I480 Paroxysmal atrial fibrillation: Secondary | ICD-10-CM | POA: Insufficient documentation

## 2024-03-18 DIAGNOSIS — I10 Essential (primary) hypertension: Secondary | ICD-10-CM | POA: Diagnosis present

## 2024-03-18 NOTE — Patient Instructions (Signed)
 Medication Instructions:   Your physician recommends that you continue on your current medications as directed. Please refer to the Current Medication list given to you today.    *If you need a refill on your cardiac medications before your next appointment, please call your pharmacy*  Lab Work:  None ordered at this time   If you have labs (blood work) drawn today and your tests are completely normal, you will receive your results only by:  MyChart Message (if you have MyChart) OR  A paper copy in the mail If you have any lab test that is abnormal or we need to change your treatment, we will call you to review the results.  Testing/Procedures:  None ordered at this time   Referrals:  None ordered at this time   Follow-Up:  At [facility], you and your health needs are our priority.  As part of our continuing mission to provide you with exceptional heart care, our providers are all part of one team.  This team includes your primary Cardiologist (physician) and Advanced Practice Providers or APPs (Physician Assistants and Nurse Practitioners) who all work together to provide you with the care you need, when you need it.  Your next appointment:   1 year(s)  Provider:    Evalene Lunger, MD or Barnie Hila, NP    We recommend signing up for the patient portal called MyChart.  Sign up information is provided on this After Visit Summary.  MyChart is used to connect with patients for Virtual Visits (Telemedicine).  Patients are able to view lab/test results, encounter notes, upcoming appointments, etc.  Non-urgent messages can be sent to your provider as well.   To learn more about what you can do with MyChart, go to [email or web address].

## 2024-03-18 NOTE — Progress Notes (Signed)
 "  Cardiology Clinic Note   Date: 03/18/2024 ID: Judith Wood, DOB Mar 25, 1955, MRN [id number]  Primary Cardiologist:  Evalene Lunger, MD  Chief Complaint   Judith Wood is a 69 y.o. female who presents to the clinic today for routine follow up.   Patient Profile   Judith Wood is followed by Dr. Gollan for the history outlined below.      Past medical history significant for: PAF. 14-day ZIO 09/14/2020: HR 46 to 175 bpm, average 65 bpm.  Predominantly sinus rhythm.  1 run of NSVT lasting 5 beats max rate 135 bpm.  2% A-fib/flutter burden 50 to 175 bpm, average 74 bpm with longest episode lasting 6 hours 9 minutes.  A-fib/flutter was detected within +/- 45 seconds of symptomatic patient events. Exercise tolerance test 06/12/2022: Exercise capacity mildly impaired.  Patient exercised 5 minutes max HR 101 bpm, peak METS 4.6.  No ST deviation noted.  No arrhythmias during stress.  EKG negative for ischemia. Hypertension. Hyperlipidemia. Lipid panel 10/02/2022: LDL 82, HDL 39, TG 203, total 155. Chronic venous insufficiency. Vascular ultrasound 11/01/2023: No evidence of DVT or superficial venous thrombosis bilateral lower extremity.  No evidence of deep venous insufficiency or superficial venous reflux bilaterally.  Venous reflux noted right greater saphenous vein in the thigh. Prediabetes.  In summary, patient previously followed by Dr. Hester at [facility] cardiology for PAF.  Echo in August 19 demonstrated EF 50%, mild LVH, normal RV function, trivial MR, mild TR, no valvular stenosis.    She established care with Dr. Gollan on 06/07/2020. She reported 1-2 episodes of afib per month. She was no on OAC and was taking flecainide  as needed. Propranolol  was added.  Upon follow-up in June 2022 she reported having 1-2 episodes of a week after having COVID.  She wore a 14-day ZIO which demonstrated 2% A-fib/flutter burden.  It was recommended she stay on diltiazem  and add Toprol  25 mg daily.  She was not  interested in starting Coumadin, Eliquis  or Xarelto.  She would consider Pradaxa when it goes generic, as she had no insurance coverage.  It was suggested she obtain an Apple Watch for close monitoring of her rhythm.  Upon follow-up in July 2023 she continued to have rare episodes of A-fib well-managed on regimen of diltiazem , Toprol  daily with flecainide  as needed.  She was utilizing a smart watch to her heart rate.  She was able to get started on Medicare for health coverage but is interested in starting a NOAC.  Eliquis  was initiated.  On follow-up in March 2024 she was started on flecainide  100 mg twice daily.  She underwent treadmill stress test which showed mildly impaired exercise capacity and no ischemia.  In June 2024 she complained of low heart rate at night in the 40s.  It was recommended to decrease Toprol  to 25 mg.  Patient was last seen in the office by Dr. Gollan on 02/23/2023 for routine follow-up.  She reported A-fib well-controlled on regimen of flecainide  100 mg twice daily, diltiazem  180 mg daily and Toprol  50 mg daily.  She continues to take Eliquis  for stroke prophylaxis.     History of Present Illness    Today, patient is here alone. Patient denies shortness of breath, dyspnea on exertion, lower extremity edema, orthopnea or PND. No chest pain, pressure, or tightness. No palpitations.  When she is in afib she feels fluttering in her chest like a butterfly in my chest. She has not had any episodes of afib since her last  visit. She is very motivated to lose weight and become more active. She recently started on Zepbound . She is walking for exercise 3 times a week. If she walks outside it is typically for 25 minutes. If it is on the treadmill it is for 15 minutes. She is also working to improve her diet by cutting out sweets and tracking her calories. She has never had a sleep study. She does not snore, has never been told she has apneic events at night and denies daytime somnolence.  Discussed other factors that put her at risk for sleep apnea. She knows she would not use a CPAP and does not feel a sleep study is indicated given she sleeps well and does not experience fatigue.      ROS: All other systems reviewed and are otherwise negative except as noted in History of Present Illness.  EKGs/Labs Reviewed    EKG Interpretation Date/Time:  Tuesday March 18 2024 13:50:24 EST Ventricular Rate:  60 PR Interval:  226 QRS Duration:  100 QT Interval:  436 QTC Calculation: 436 R Axis:   28  Text Interpretation: Sinus rhythm with sinus arrhythmia with 1st degree A-V block Low voltage QRS Nonspecific T wave abnormality When compared with ECG of 12-Feb-2024 01:26, No significant change was found Confirmed by Loistine Sober [phone]) on 03/18/2024 1:54:11 PM   02/25/2024: ALT 51; AST 36; BUN 24; Creat 0.80; Potassium 5.0; Sodium 131   02/25/2024: Hemoglobin 12.8; WBC 4.8    Risk Assessment/Calculations     CHA2DS2-VASc Score = 3   This indicates a 3.2% annual risk of stroke. The patient's score is based upon: CHF History: 0 HTN History: 1 Diabetes History: 0 Stroke History: 0 Vascular Disease History: 0 Age Score: 1 Gender Score: 1        STOP-Bang Score:  4       Physical Exam    VS:  BP 114/74 (BP Location: Left Arm, Patient Position: Sitting, Cuff Size: Large)   Resp 18   Ht 5' 5 (1.651 m)   Wt 253 lb (114.8 kg)   SpO2 96%   BMI 42.10 kg/m  , BMI Body mass index is 42.1 kg/m.  GEN: Well nourished, well developed, in no acute distress. Neck: No JVD or carotid bruits. Cardiac:  RRR.  No murmur. No rubs or gallops.   Respiratory:  Respirations regular and unlabored. Clear to auscultation without rales, wheezing or rhonchi. GI: Soft, nontender, nondistended. Extremities: Radials/DP/PT 2+ and equal bilaterally. No clubbing or cyanosis. No edema   Skin: Warm and dry, no rash. Neuro: Strength intact.  Assessment & Plan   PAF 14-day ZIO July  2022 demonstrated HR 46, 75 bpm, average 65 bpm, 1 run of NSVT 5 beats max rate 135 bpm, 2% A-fib/flutter burden 50 to 175 bpm average 74 bpm with longest episode lasting 6 hours and 9 minutes, A-fib/flutter detected within +/- 45 seconds of symptomatic patient events.  Exercise tolerance test April 2024 demonstrated mildly impaired exercise capacity with no ischemia.  Denies spontaneous bleeding concerns.  Patient denies any episodes of fluttering in her chest since last visit. She has not undergone a sleep study in the past and does not feel one is indicated as she does not snore or have daytime somnolence. EKG today demonstrates sinus rhythm with sinus arrhythmia and 1st degree AV block 60 bpm.  - Continue Eliquis , diltiazem , flecainide , Toprol .  Hypertension BP today 114/74. No headaches or dizziness.  - Continue Toprol  and diltiazem .  Obesity Patient  is highly motivated to lose weight. She recently started on Zepbound . She is walking for exercise 3 times a week either outside or on a treadmill. She is improving her diet by cutting out sweets and tracking her calories.  - Encouraged continuing to increase physical activity and improving diet.   Disposition: Return in 1 year or sooner as needed.          Signed, Barnie HERO. Ulyess Muto, DNP, NP-C  "

## 2024-03-21 ENCOUNTER — Other Ambulatory Visit: Payer: Self-pay | Admitting: Family Medicine

## 2024-03-21 DIAGNOSIS — E66813 Obesity, class 3: Secondary | ICD-10-CM

## 2024-03-21 NOTE — Telephone Encounter (Signed)
 Requested medications are due for refill today.  yes  Requested medications are on the active medications list.  yes  Last refill. 02/29/2024 2mL 0 rf  Future visit scheduled.   yes  Notes to clinic.  Conversation between pt and provider discusses increasing dose monthly. Medication not assigned to a protocol. Please review for refill.     Requested Prescriptions  Pending Prescriptions Disp Refills   ZEPBOUND  2.5 MG/0.5ML injection vial [Pharmacy Med Name: ZEPBOUND  2.5 MG/0.5ML SUBCUTANEOUS SOLUTION] 2 mL 0    Sig: INJECT 0.5 ML (2.5 MG) UNDER THE SKIN ONCE WEEKLY (0.5ML= 50 UNITS)     Off-Protocol Failed - 03/21/2024  4:30 PM      Failed - Medication not assigned to a protocol, review manually.      Passed - Valid encounter within last 12 months    Recent Outpatient Visits           3 weeks ago Elevated liver enzymes    [facility], Marsa PARAS, DO   5 months ago Encounter to establish care with new doctor   [facility] Marsa PARAS, DO   8 months ago Abdominal wall mass of periumbilical region   [facility], Leita DEL, MD

## 2024-03-24 MED ORDER — TIRZEPATIDE-WEIGHT MANAGEMENT 5 MG/0.5ML ~~LOC~~ SOLN: 5.0000 mg | SUBCUTANEOUS | 0 refills | Status: DC

## 2024-03-24 NOTE — Addendum Note (Signed)
 Addended by: EDMAN MARSA PARAS on: 03/24/2024 06:12 PM   Modules accepted: Orders

## 2024-04-01 ENCOUNTER — Other Ambulatory Visit

## 2024-04-02 ENCOUNTER — Encounter: Payer: Self-pay | Admitting: Cardiovascular Disease

## 2024-04-03 ENCOUNTER — Other Ambulatory Visit

## 2024-04-03 DIAGNOSIS — E782 Mixed hyperlipidemia: Secondary | ICD-10-CM

## 2024-04-03 DIAGNOSIS — I10 Essential (primary) hypertension: Secondary | ICD-10-CM

## 2024-04-03 DIAGNOSIS — I480 Paroxysmal atrial fibrillation: Secondary | ICD-10-CM

## 2024-04-03 DIAGNOSIS — Z6841 Body Mass Index (BMI) 40.0 and over, adult: Secondary | ICD-10-CM

## 2024-04-03 DIAGNOSIS — R7303 Prediabetes: Secondary | ICD-10-CM

## 2024-04-04 LAB — LIPID PANEL
Cholesterol: 134 mg/dL
HDL: 46 mg/dL — ABNORMAL LOW
LDL Cholesterol (Calc): 68 mg/dL
Non-HDL Cholesterol (Calc): 88 mg/dL
Total CHOL/HDL Ratio: 2.9 (calc)
Triglycerides: 123 mg/dL

## 2024-04-04 LAB — CBC WITH DIFFERENTIAL/PLATELET
Absolute Lymphocytes: 1553 {cells}/uL (ref 850–3900)
Absolute Monocytes: 473 {cells}/uL (ref 200–950)
Basophils Absolute: 9 {cells}/uL (ref 0–200)
Basophils Relative: 0.2 %
Eosinophils Absolute: 0 {cells}/uL — ABNORMAL LOW (ref 15–500)
Eosinophils Relative: 0 %
HCT: 43.6 % (ref 35.9–46.0)
Hemoglobin: 14.3 g/dL (ref 11.7–15.5)
MCH: 31 pg (ref 27.0–33.0)
MCHC: 32.8 g/dL (ref 31.6–35.4)
MCV: 94.6 fL (ref 81.4–101.7)
MPV: 8.7 fL (ref 7.5–12.5)
Monocytes Relative: 10.5 %
Neutro Abs: 2466 {cells}/uL (ref 1500–7800)
Neutrophils Relative %: 54.8 %
Platelets: 280 10*3/uL (ref 140–400)
RBC: 4.61 Million/uL (ref 3.80–5.10)
RDW: 11.8 % (ref 11.0–15.0)
Total Lymphocyte: 34.5 %
WBC: 4.5 10*3/uL (ref 3.8–10.8)

## 2024-04-04 LAB — HEMOGLOBIN A1C
Hgb A1c MFr Bld: 5.3 %
Mean Plasma Glucose: 105 mg/dL
eAG (mmol/L): 5.8 mmol/L

## 2024-04-04 LAB — COMPREHENSIVE METABOLIC PANEL WITH GFR
AG Ratio: 1.4 (calc) (ref 1.0–2.5)
ALT: 40 U/L — ABNORMAL HIGH (ref 6–29)
AST: 34 U/L (ref 10–35)
Albumin: 4.3 g/dL (ref 3.6–5.1)
Alkaline phosphatase (APISO): 77 U/L (ref 37–153)
BUN: 21 mg/dL (ref 7–25)
CO2: 30 mmol/L (ref 20–32)
Calcium: 9.1 mg/dL (ref 8.6–10.4)
Chloride: 95 mmol/L — ABNORMAL LOW (ref 98–110)
Creat: 0.86 mg/dL (ref 0.50–1.05)
Globulin: 3 g/dL (ref 1.9–3.7)
Glucose, Bld: 87 mg/dL (ref 65–99)
Potassium: 4.3 mmol/L (ref 3.5–5.3)
Sodium: 132 mmol/L — ABNORMAL LOW (ref 135–146)
Total Bilirubin: 0.5 mg/dL (ref 0.2–1.2)
Total Protein: 7.3 g/dL (ref 6.1–8.1)
eGFR: 74 mL/min/{1.73_m2}

## 2024-04-04 LAB — TSH: TSH: 2.02 m[IU]/L (ref 0.40–4.50)

## 2024-04-08 ENCOUNTER — Encounter: Payer: Self-pay | Admitting: Family Medicine

## 2024-04-08 ENCOUNTER — Ambulatory Visit (INDEPENDENT_AMBULATORY_CARE_PROVIDER_SITE_OTHER): Admitting: Family Medicine

## 2024-04-08 VITALS — BP 112/76 | HR 46 | Ht 65.0 in | Wt 247.4 lb

## 2024-04-08 DIAGNOSIS — Z9884 Bariatric surgery status: Secondary | ICD-10-CM

## 2024-04-08 DIAGNOSIS — Z6841 Body Mass Index (BMI) 40.0 and over, adult: Secondary | ICD-10-CM | POA: Diagnosis not present

## 2024-04-08 DIAGNOSIS — R7303 Prediabetes: Secondary | ICD-10-CM

## 2024-04-08 DIAGNOSIS — K760 Fatty (change of) liver, not elsewhere classified: Secondary | ICD-10-CM

## 2024-04-08 DIAGNOSIS — E871 Hypo-osmolality and hyponatremia: Secondary | ICD-10-CM

## 2024-04-08 DIAGNOSIS — I480 Paroxysmal atrial fibrillation: Secondary | ICD-10-CM | POA: Diagnosis not present

## 2024-04-08 DIAGNOSIS — I10 Essential (primary) hypertension: Secondary | ICD-10-CM | POA: Diagnosis not present

## 2024-04-08 NOTE — Patient Instructions (Addendum)
 Thank you for coming to the office today.  Finish Zepbound  injection  Let me know if you want to continue this or switch to wegovy pill  Good with current fluid intake, okay to scale back 10-20 oz small amount  Okay to keep 1400 calorie diet  [email or web address]  ------------------------------------------------------  Tzhncb oral tablet  Starting dose is 1.5mg  once daily, first thing in morning on empty stomach, 4 oz or a sip of water only. It cannot be taken with any other medication, supplement/vitamin, food or beverages. Wait 30 minutes after you take oral Wegovy before consuming a meal or taking medications/vitamins - or else it will lose it's effectiveness. (Note there is no harm in taking it with food/medicines, it just will not work).  We typically increase the dose every 30 days or until you reach a preferred stable dose. The dosing regimen is as follows: 1.5mg  = starting dose 4mg  9mg  25mg   After 3 weeks, please contact me (before you run out of the medication) and let me know if you are doing well and prefer to keep the current dose or raise the dose. The medication should reduce appetite and promote weight loss, and the effect improves with higher doses. Some patients may not have desired result with the lowest starting doses. Common side effects include constipation, nausea, digestive upset.  At this time, many insurance plans do not offer weight loss medication coverage, therefore Wegovy (pill or injections) is unlikely to be covered.  If wegovy is not covered, there will be a price at the pharmacy to pick it up and insurance will not be involved. - At this time Wegovy oral is currently $149 and higher doses may escalate in price to $199+  If wegovy is covered you can Text SAVE to [phone] or call [phone].   Or go to website for coupon. https://www.[employer].com/patient/medicines/wegovy/savings-offer.html   So regardless of which one we order we can  try to get insurance coverage - however ultimately it sounds like you are stating that they do not cover it. So that will save us  some time.   If you for sure know they are not covering Wegovy, then we could try to get the discounted rates through the company.   For eligible patients new to the [facility] Savings offer and fill through [facility]- Pay $199/month for 0.25 mg and 0.5 mg doses for up to two monthly fills through June 03, 2024. Thereafter price is $349/month. Full terms: [facility] and [employer].   https://www.[facility].com/pharmacy.html   Check this website and see if you want to pursue. Ultimately, we can send the rx for pill or shot to the [facility] ([employer] is the company that makes the med) and you can get their discounted rates - this is for self pay or cash pay. Not involving insurance.   For injections - per month starting at $199 going up to $349+ For pill - Per month starting at $149 going up to $199+    Let me know if you want to go through retail pharmacy and see what insurance says, but I am afraid of the price tag on this option. Or go for the discounted pricing through the [employer].    Please schedule a Follow-up Appointment to: Return in about 6 months (around 10/06/2024) for 6 month fasting lab then 1 week later Follow-up Weight, LFTs, PreDM, Cards updates.  If you have any other questions or concerns, please feel free to call the office or send a message through  MyChart. You may also schedule an earlier appointment if necessary.  Additionally, you may be receiving a survey about your experience at our office within a few days to 1 week by e-mail or mail. We value your feedback.  Marsa Officer, DO [facility], [place]

## 2024-04-09 ENCOUNTER — Encounter: Payer: Self-pay | Admitting: Family Medicine

## 2024-04-09 DIAGNOSIS — E66813 Obesity, class 3: Secondary | ICD-10-CM

## 2024-04-10 MED ORDER — TIRZEPATIDE-WEIGHT MANAGEMENT 5 MG/0.5ML ~~LOC~~ SOLN: 5.0000 mg | SUBCUTANEOUS | 5 refills | Status: AC

## 2024-04-10 NOTE — Addendum Note (Signed)
 Addended by: EDMAN MARSA PARAS on: 04/10/2024 11:22 AM   Modules accepted: Orders

## 2024-09-29 ENCOUNTER — Other Ambulatory Visit

## 2024-10-06 ENCOUNTER — Ambulatory Visit: Admitting: Family Medicine

## 2024-12-19 ENCOUNTER — Ambulatory Visit
